# Patient Record
Sex: Female | Born: 1970 | Race: Black or African American | Hispanic: No | Marital: Married | Smoking: Never smoker
Health system: Southern US, Community
[De-identification: ages and names within clinical notes are randomized; demographics above are authoritative.]

## PROBLEM LIST (undated history)

## (undated) DIAGNOSIS — F329 Major depressive disorder, single episode, unspecified: Secondary | ICD-10-CM

## (undated) DIAGNOSIS — R51 Headache: Secondary | ICD-10-CM

## (undated) DIAGNOSIS — D6861 Antiphospholipid syndrome: Secondary | ICD-10-CM

## (undated) DIAGNOSIS — F419 Anxiety disorder, unspecified: Secondary | ICD-10-CM

## (undated) DIAGNOSIS — G8929 Other chronic pain: Secondary | ICD-10-CM

## (undated) DIAGNOSIS — M545 Low back pain, unspecified: Secondary | ICD-10-CM

## (undated) DIAGNOSIS — M797 Fibromyalgia: Secondary | ICD-10-CM

## (undated) DIAGNOSIS — S80862A Insect bite (nonvenomous), left lower leg, initial encounter: Secondary | ICD-10-CM

## (undated) DIAGNOSIS — E789 Disorder of lipoprotein metabolism, unspecified: Secondary | ICD-10-CM

## (undated) DIAGNOSIS — S060X9A Concussion with loss of consciousness of unspecified duration, initial encounter: Secondary | ICD-10-CM

## (undated) DIAGNOSIS — N3010 Interstitial cystitis (chronic) without hematuria: Secondary | ICD-10-CM

## (undated) DIAGNOSIS — K279 Peptic ulcer, site unspecified, unspecified as acute or chronic, without hemorrhage or perforation: Secondary | ICD-10-CM

## (undated) DIAGNOSIS — F32A Depression, unspecified: Secondary | ICD-10-CM

## (undated) DIAGNOSIS — I10 Essential (primary) hypertension: Secondary | ICD-10-CM

## (undated) DIAGNOSIS — F319 Bipolar disorder, unspecified: Secondary | ICD-10-CM

## (undated) DIAGNOSIS — E282 Polycystic ovarian syndrome: Secondary | ICD-10-CM

## (undated) DIAGNOSIS — R609 Edema, unspecified: Secondary | ICD-10-CM

## (undated) DIAGNOSIS — I2699 Other pulmonary embolism without acute cor pulmonale: Secondary | ICD-10-CM

## (undated) DIAGNOSIS — E039 Hypothyroidism, unspecified: Secondary | ICD-10-CM

## (undated) DIAGNOSIS — R519 Headache, unspecified: Secondary | ICD-10-CM

## (undated) DIAGNOSIS — K219 Gastro-esophageal reflux disease without esophagitis: Secondary | ICD-10-CM

## (undated) DIAGNOSIS — M329 Systemic lupus erythematosus, unspecified: Secondary | ICD-10-CM

## (undated) DIAGNOSIS — T39091A Poisoning by salicylates, accidental (unintentional), initial encounter: Secondary | ICD-10-CM

## (undated) DIAGNOSIS — M542 Cervicalgia: Secondary | ICD-10-CM

## (undated) DIAGNOSIS — W57XXXA Bitten or stung by nonvenomous insect and other nonvenomous arthropods, initial encounter: Secondary | ICD-10-CM

## (undated) DIAGNOSIS — M069 Rheumatoid arthritis, unspecified: Secondary | ICD-10-CM

## (undated) HISTORY — DX: Gastro-esophageal reflux disease without esophagitis: K21.9

## (undated) HISTORY — DX: Polycystic ovarian syndrome: E28.2

## (undated) HISTORY — DX: Interstitial cystitis (chronic) without hematuria: N30.10

## (undated) HISTORY — DX: Other pulmonary embolism without acute cor pulmonale: I26.99

## (undated) HISTORY — DX: Anxiety disorder, unspecified: F41.9

## (undated) HISTORY — DX: Edema, unspecified: R60.9

## (undated) HISTORY — DX: Bipolar disorder, unspecified: F31.9

## (undated) HISTORY — DX: Peptic ulcer, site unspecified, unspecified as acute or chronic, without hemorrhage or perforation: K27.9

## (undated) HISTORY — DX: Essential (primary) hypertension: I10

## (undated) HISTORY — DX: Hypothyroidism, unspecified: E03.9

---

## 1999-02-10 ENCOUNTER — Emergency Department (HOSPITAL_COMMUNITY): Admission: EM | Admit: 1999-02-10 | Discharge: 1999-02-11 | Payer: Self-pay | Admitting: Emergency Medicine

## 2002-04-23 HISTORY — PX: LAPAROSCOPIC CHOLECYSTECTOMY: SUR755

## 2002-07-15 ENCOUNTER — Other Ambulatory Visit: Admission: RE | Admit: 2002-07-15 | Discharge: 2002-07-15 | Payer: Self-pay | Admitting: Family Medicine

## 2002-07-20 ENCOUNTER — Encounter: Admission: RE | Admit: 2002-07-20 | Discharge: 2002-07-20 | Payer: Self-pay | Admitting: Family Medicine

## 2002-07-20 ENCOUNTER — Encounter: Payer: Self-pay | Admitting: Family Medicine

## 2002-10-08 ENCOUNTER — Encounter: Admission: RE | Admit: 2002-10-08 | Discharge: 2002-10-08 | Payer: Self-pay | Admitting: Family Medicine

## 2002-10-08 ENCOUNTER — Encounter: Payer: Self-pay | Admitting: Family Medicine

## 2006-04-26 ENCOUNTER — Ambulatory Visit (HOSPITAL_COMMUNITY): Payer: Self-pay | Admitting: Psychiatry

## 2006-05-29 ENCOUNTER — Ambulatory Visit (HOSPITAL_COMMUNITY): Payer: Self-pay | Admitting: Psychiatry

## 2006-07-24 ENCOUNTER — Ambulatory Visit (HOSPITAL_COMMUNITY): Payer: Self-pay | Admitting: Psychiatry

## 2006-09-25 ENCOUNTER — Ambulatory Visit (HOSPITAL_COMMUNITY): Payer: Self-pay | Admitting: Psychiatry

## 2008-11-08 ENCOUNTER — Ambulatory Visit: Payer: Self-pay | Admitting: Occupational Medicine

## 2008-11-08 DIAGNOSIS — F329 Major depressive disorder, single episode, unspecified: Secondary | ICD-10-CM

## 2008-11-08 DIAGNOSIS — F411 Generalized anxiety disorder: Secondary | ICD-10-CM | POA: Insufficient documentation

## 2008-11-08 DIAGNOSIS — E039 Hypothyroidism, unspecified: Secondary | ICD-10-CM | POA: Insufficient documentation

## 2008-11-08 DIAGNOSIS — I10 Essential (primary) hypertension: Secondary | ICD-10-CM

## 2008-11-08 DIAGNOSIS — K5730 Diverticulosis of large intestine without perforation or abscess without bleeding: Secondary | ICD-10-CM | POA: Insufficient documentation

## 2008-11-08 LAB — CONVERTED CEMR LAB
Basophils Absolute: 0 10*3/uL (ref 0.0–0.1)
Basophils Relative: 0 % (ref 0–1)
Eosinophils Absolute: 0 10*3/uL (ref 0.0–0.7)
Eosinophils Relative: 0 % (ref 0–5)
HCT: 41.2 % (ref 36.0–46.0)
Hemoglobin: 14 g/dL (ref 12.0–15.0)
Lymphocytes Relative: 36 % (ref 12–46)
Lymphs Abs: 4.3 10*3/uL — ABNORMAL HIGH (ref 0.7–4.0)
MCHC: 34 g/dL (ref 30.0–36.0)
MCV: 83.4 fL (ref 78.0–100.0)
Monocytes Absolute: 0.2 10*3/uL (ref 0.1–1.0)
Monocytes Relative: 2 % — ABNORMAL LOW (ref 3–12)
Neutro Abs: 7.4 10*3/uL (ref 1.7–7.7)
Neutrophils Relative %: 62 % (ref 43–77)
Platelets: 253 10*3/uL (ref 150–400)
RBC: 4.93 M/uL (ref 3.87–5.11)
RDW: 13.5 % (ref 11.5–15.5)
WBC: 12 10*3/uL — ABNORMAL HIGH (ref 4.0–10.5)

## 2008-11-15 ENCOUNTER — Ambulatory Visit: Payer: Self-pay | Admitting: Family Medicine

## 2008-12-10 ENCOUNTER — Encounter: Payer: Self-pay | Admitting: Family Medicine

## 2009-11-25 ENCOUNTER — Ambulatory Visit: Payer: Self-pay | Admitting: Family Medicine

## 2009-11-25 DIAGNOSIS — N3010 Interstitial cystitis (chronic) without hematuria: Secondary | ICD-10-CM | POA: Insufficient documentation

## 2009-11-25 DIAGNOSIS — E669 Obesity, unspecified: Secondary | ICD-10-CM

## 2010-01-25 ENCOUNTER — Encounter: Payer: Self-pay | Admitting: Family Medicine

## 2010-01-26 ENCOUNTER — Encounter: Payer: Self-pay | Admitting: Family Medicine

## 2010-01-27 ENCOUNTER — Telehealth: Payer: Self-pay | Admitting: Family Medicine

## 2010-02-03 ENCOUNTER — Encounter: Payer: Self-pay | Admitting: Family Medicine

## 2010-02-03 DIAGNOSIS — G4733 Obstructive sleep apnea (adult) (pediatric): Secondary | ICD-10-CM

## 2010-04-18 ENCOUNTER — Encounter: Payer: Self-pay | Admitting: Family Medicine

## 2010-05-23 NOTE — Assessment & Plan Note (Signed)
Summary: NOV interstitial cystitis   Vital Signs:  Patient profile:   40 year old female Height:      64.5 inches Weight:      223 pounds BMI:     37.82 O2 Sat:      99 % on Room air Pulse rate:   85 / minute BP sitting:   126 / 81  (left arm) Cuff size:   large  Vitals Entered By: Payton Spark CMA (November 25, 2009 2:39 PM)  O2 Flow:  Room air CC: New to est. Multiple health concerns., Back Pain   Primary Care Provider:  Seymour Bars DO  CC:  New to est. Multiple health concerns. and Back Pain.  History of Present Illness: 40 yo presents for NOV.  She was formerly seen at [facility].  She would like to lose wt but has struggled with med SEs and fatigue.  She is doing no exercise.  She has a hx of thyroid d/o, HTN, PCOS, migraines, insomnia and bipolar d/o. She sees Dr Wellington Hampshire in [facility] for her bipolar d/o and a counselor.  She reports that she is trying to get used to the effects of all of her medications.  She is set up to get a Sleep study  She is taking Furosemide everyday for fluid retention.   She has had an elevated fasitng sugar with PCOS -- on Metformin.  She is on Zolpem and Klonopin.      Current Medications (verified): 1)  Synthroid 25 Mcg Tabs (Levothyroxine Sodium) .Marland Kitchen.. 1 Tab  By Mouth Once Daily 2)  Furosemide 20 Mg Tabs (Furosemide) .Marland Kitchen.. 1 Tab By Mouth Once Daily 3)  Lorazepam 1 Mg Tabs (Lorazepam) .... Take 2 Tabs By Mouth At Bedtime 4)  Neurontin 300 Mg Caps (Gabapentin) .... Take 3 Caps By Mouth At Bedtime 5)  Clonidine Hcl .... Take 2 Tabs By Mouth At Bedtime 6)  Metformin Hcl 500 Mg Tabs (Metformin Hcl) .... Take 1 Tab By Mouth Two Times A Day 7)  Zolpidem Tartrate 10 Mg Tabs (Zolpidem Tartrate) .... Take 1 Tab By Mouth At Bedtime 8)  Topamax 100 Mg Tabs (Topiramate) .... Take 1 Tab By Mouth Two Times A Day 9)  Verapamil Hcl 120 Mg Tabs (Verapamil Hcl) .... Take 3 Tabs By Mouth At Bedtime 10)  Pantoprazole Sodium 40 Mg Tbec (Pantoprazole Sodium) ....  Take 1 Tab By Mouth Once Daily  Allergies (verified): No Known Drug Allergies  Past History:  Past Medical History: Anxiety Bipolar d/o -- rapid cycler -- Dr Wellington Hampshire Hypertension Hypothyroidism- Dr Sidney Ace (endo) Water retention GERD -- GI Dr Eulah Pont OB Dr Wylene Simmer Interstitial cystitis ENT-  Allergiest- Dr Dixie Dials  Past Surgical History: Cholecystectomy 04  Social History: Married with no children Never Smoked Alcohol use-no Drug use-no Regular exercise-no Works as a Engineer, petroleum  Review of Systems      See HPI       no fevers/sweats/weakness, unexplained wt loss/gain, no change in vision, no difficulty hearing, ringing in ears, no hay fever/allergies, no CP/discomfort, no palpitations, no breast lump/nipple discharge, no cough/wheeze, no blood in stool, no N/V/D, no nocturia, + leaking urine, no unusual vag bleeding, no vaginal/penile discharge, no muscle/joint pain, no rash, no new/changing mole, + HA, no memory loss, + anxiety, +sleep problem, + depression, no unexplained lumps, no easy bruising/bleeding, no concern with sexual function    Physical Exam  General:  alert, well-developed, well-nourished, and well-hydrated.  obese Head:  normocephalic and atraumatic.  Eyes:  conjunctiva clear Nose:  no nasal discharge.   Mouth:  good dentition and pharynx pink and moist.   Neck:  no masses.   Lungs:  Normal respiratory effort, chest expands symmetrically. Lungs are clear to auscultation, no crackles or wheezes. Heart:  Normal rate and regular rhythm. S1 and S2 normal without gallop, murmur, click, rub or other extra sounds. Abdomen:  soft, non-tender, normal bowel sounds, no distention, no masses, no guarding, no hepatomegaly, and no splenomegaly.   Pulses:  2+ radial pulses Extremities:  no E/C/C Neurologic:  no tremor Skin:  color normal.   Cervical Nodes:  No lymphadenopathy noted Psych:  good eye contact, not anxious appearing, and not depressed  appearing.     Impression & Recommendations:  Problem # 1:  INTERSTITIAL CYSTITIS (ICD-595.1) Referral made to urologist for hx of IC.  Orders: Urology Referral (Urology)  Problem # 2:  HYPERTENSION (ICD-401.9)  Her updated medication list for this problem includes:    Furosemide 20 Mg Tabs (Furosemide) .Marland Kitchen... 1 tab by mouth once daily    Verapamil Hcl 120 Mg Tabs (Verapamil hcl) .Marland Kitchen... Take 3 tabs by mouth at bedtime  BP today: 126/81 Prior BP: 133/90 (11/15/2008)  Problem # 3:  HYPOTHYROIDISM (ICD-244.9)  Her updated medication list for this problem includes:    Synthroid 25 Mcg Tabs (Levothyroxine sodium) .Marland Kitchen... 1 tab  by mouth once daily  Orders: T-TSH ([id number]) T-T4, Free [id number])  Problem # 4:  OBESITY, UNSPECIFIED (ICD-278.00) BMI 37 c/w class II obesity. Discussed goals -- dietary changes with increased physical activity. Check thyroid and will be getting a sleep study.    Complete Medication List: 1)  Synthroid 25 Mcg Tabs (Levothyroxine sodium) .Marland Kitchen.. 1 tab  by mouth once daily 2)  Furosemide 20 Mg Tabs (Furosemide) .Marland Kitchen.. 1 tab by mouth once daily 3)  Lorazepam 1 Mg Tabs (Lorazepam) .... Take 2 tabs by mouth at bedtime 4)  Neurontin 300 Mg Caps (Gabapentin) .... Take 3 caps by mouth at bedtime 5)  Clonidine Hcl  .... Take 2 tabs by mouth at bedtime 6)  Metformin Hcl 500 Mg Tabs (Metformin hcl) .... Take 1 tab by mouth two times a day 7)  Zolpidem Tartrate 10 Mg Tabs (Zolpidem tartrate) .... Take 1 tab by mouth at bedtime 8)  Topamax 100 Mg Tabs (Topiramate) .... Take 1 tab by mouth two times a day 9)  Verapamil Hcl 120 Mg Tabs (Verapamil hcl) .... Take 3 tabs by mouth at bedtime 10)  Pantoprazole Sodium 40 Mg Tbec (Pantoprazole sodium) .... Take 1 tab by mouth once daily  Other Orders: T-Comprehensive Metabolic Panel [id number]) T-Lipid Profile ([id number])  Patient Instructions: 1)  Update fasting labs. 2)  Will call you w/ results. 3)  Meds  RFd. 4)  Return to see me as needed. 5)  I will happy to coordinate care with your specialists. 6)  Focus on low carb diet (high protein, lots of veggies) and increase exercise. Prescriptions: TOPAMAX 100 MG TABS (TOPIRAMATE) Take 1 tab by mouth two times a day  #60 x 3   Entered and Authorized by:   Seymour Bars DO   Signed by:   Seymour Bars DO on 11/25/2009   Method used:   Electronically to        [facility]* (retail)       [address] [place], [place]  [address]       Ph: [phone]  Fax: [phone]   RxID:   [id number] FUROSEMIDE 20 MG TABS (FUROSEMIDE) 1 tab by mouth once daily  #30 x 3   Entered and Authorized by:   Seymour Bars DO   Signed by:   Seymour Bars DO on 11/25/2009   Method used:   Electronically to        [facility]* (retail)       [address] [place], [place]  [address]       Ph: [phone]       Fax: [phone]   RxID:   [id number]

## 2010-05-23 NOTE — Progress Notes (Signed)
Summary: CALL A NURSE  Phone Note From Other Clinic   Summary of Call: [facility] Triage Call Report Triage Record Num: [id number] Operator: Patriciaann Clan Patient Name: Diane [name] Call Date & Time: 01/26/2010 7:09:45PM Patient Phone: [phone] PCP: Seymour Bars, DO Patient Gender: Female PCP Fax : [phone] Patient DOB: 1970/10/10 Practice Name: [facility] Reason for Call: LMP 01/16/10. Patient calling. States has hx of migraine headaches. States developed headache over frontal area of head and behind eye, onset 01/26/10 1700. States pain @ "8" on 1-10 scale. States has fever per tactile. Onset 01/26/10. Patient does not have a thermometer. No nuccal rigidity. Patient advised to be seen in UC now for evaluation. Patient advised not to drive self. Patietn verbalizes understanding and agreeable. Initial call taken by: Payton Spark CMA,  January 27, 2010 11:07 AM

## 2010-05-23 NOTE — Miscellaneous (Signed)
Summary: osa  Clinical Lists Changes  Problems: Added new problem of OBSTRUCTIVE SLEEP APNEA (ICD-327.23)

## 2010-05-23 NOTE — Letter (Signed)
Summary: Letter Regarding Sleep Study/[facility]  Letter Regarding Sleep Study/[facility]   Imported By: Lanelle Bal 02/16/2010 08:41:23  _____________________________________________________________________  External Attachment:    Type:   Image     Comment:   External Document

## 2010-05-23 NOTE — Letter (Signed)
Summary: Letters Regarding Sleep Study/[facility]  Letters Regarding Sleep Study/[facility]   Imported By: Lanelle Bal 02/10/2010 16:08:11  _____________________________________________________________________  External Attachment:    Type:   Image     Comment:   External Document

## 2010-05-25 NOTE — Letter (Signed)
Summary: [facility]  [facility]   Imported By: Lanelle Bal 05/03/2010 10:31:01  _____________________________________________________________________  External Attachment:    Type:   Image     Comment:   External Document

## 2010-06-07 DIAGNOSIS — G43909 Migraine, unspecified, not intractable, without status migrainosus: Secondary | ICD-10-CM | POA: Insufficient documentation

## 2010-06-07 DIAGNOSIS — F309 Manic episode, unspecified: Secondary | ICD-10-CM | POA: Insufficient documentation

## 2010-06-14 ENCOUNTER — Ambulatory Visit: Payer: Self-pay | Admitting: Family Medicine

## 2010-06-16 ENCOUNTER — Ambulatory Visit: Payer: Self-pay | Admitting: Family Medicine

## 2010-06-16 ENCOUNTER — Telehealth: Payer: Self-pay | Admitting: Family Medicine

## 2010-06-20 NOTE — Progress Notes (Signed)
Summary: Refill--Furosemide  Phone Note Refill Request Message from:  Patient on June 16, 2010 9:54 AM  Refills Requested: Medication #1:  FUROSEMIDE 20 MG TABS 1 tab by mouth once daily   Last Refilled: 10.2011   Notes: [facility]  Initial call taken by: Lucious Groves CMA,  June 16, 2010 9:54 AM    Prescriptions: FUROSEMIDE 20 MG TABS (FUROSEMIDE) 1 tab by mouth once daily  #30 x 3   Entered by:   Lucious Groves CMA   Authorized by:   Seymour Bars DO   Signed by:   Lucious Groves CMA on 06/16/2010   Method used:   Electronically to        [facility]* (retail)       [address] [place], [place]  [address]       Ph: [phone]       Fax: [phone]   RxID:   [id number]

## 2010-06-23 ENCOUNTER — Ambulatory Visit (INDEPENDENT_AMBULATORY_CARE_PROVIDER_SITE_OTHER): Payer: 59 | Admitting: Family Medicine

## 2010-06-23 ENCOUNTER — Encounter: Payer: Self-pay | Admitting: Family Medicine

## 2010-06-23 DIAGNOSIS — E282 Polycystic ovarian syndrome: Secondary | ICD-10-CM

## 2010-06-23 DIAGNOSIS — E039 Hypothyroidism, unspecified: Secondary | ICD-10-CM

## 2010-06-23 DIAGNOSIS — G4733 Obstructive sleep apnea (adult) (pediatric): Secondary | ICD-10-CM

## 2010-06-23 DIAGNOSIS — E669 Obesity, unspecified: Secondary | ICD-10-CM

## 2010-06-29 NOTE — Assessment & Plan Note (Signed)
Summary: f/u sleep apnea/ obesity   Vital Signs:  Patient profile:   40 year old female Height:      64.5 inches Weight:      225 pounds BMI:     38.16 O2 Sat:      98 % on Room air Pulse rate:   87 / minute BP sitting:   140 / 91  (left arm) Cuff size:   large  Vitals Entered By: Payton Spark CMA (June 23, 2010 2:09 PM)  O2 Flow:  Room air CC: F/U.    Primary Care Provider:  Seymour Bars DO  CC:  F/U. Marland Kitchen  History of Present Illness: 40 yo AAF presents for f/u visit.  She saw Dr Sidney Ace and has had a high cortisol level.  She has a f/u with him. she has tried to lose wt on her own but has failed.  her gyn told her that she had PCOS.  She had her sugar checked and it's been normal on metformin.  She has HTN.    She has tried to do more exercise and use a calorie counter.  She cut back on soda and is eating more veggies.  Her meds for bipolar have caused wt gain over the years.  she tested + for OSA but has yet to go back for cpap titration.  Back pain for wt.  Her weight has been over 200 for the past 15 yrs.   She would be happy at 120.     Current Medications (verified): 1)  Synthroid 25 Mcg Tabs (Levothyroxine Sodium) .Marland Kitchen.. 1 Tab  By Mouth Once Daily 2)  Furosemide 20 Mg Tabs (Furosemide) .Marland Kitchen.. 1 Tab By Mouth Once Daily 3)  Lorazepam 1 Mg Tabs (Lorazepam) .... Take 2 Tabs By Mouth At Bedtime 4)  Neurontin 300 Mg Caps (Gabapentin) .... Take 3 Caps By Mouth At Bedtime 5)  Clonidine Hcl .... Take 2 Tabs By Mouth At Bedtime 6)  Metformin Hcl 500 Mg Tabs (Metformin Hcl) .... Take 1 Tab By Mouth Two Times A Day 7)  Zolpidem Tartrate 10 Mg Tabs (Zolpidem Tartrate) .... Take 1 Tab By Mouth At Bedtime 8)  Verelan 240 Mg Xr24h-Cap (Verapamil Hcl) .... Take 1 Cap By Mouth At Bedtime 9)  Omeprazole 40 Mg Cpdr (Omeprazole) .... Take 1 Cap By Mouth Once Daily  Allergies (verified): No Known Drug Allergies  Past History:  Past Medical History: Reviewed history from 11/25/2009  and no changes required. Anxiety Bipolar d/o -- rapid cycler -- Dr Wellington Hampshire Hypertension Hypothyroidism- Dr Sidney Ace (endo) Water retention GERD -- GI Dr Eulah Pont OB Dr Wylene Simmer Interstitial cystitis ENT-  Allergiest- Dr Dixie Dials  Social History: Reviewed history from 11/25/2009 and no changes required. Married with no children Never Smoked Alcohol use-no Drug use-no Regular exercise-no Works as a Engineer, petroleum  Review of Systems      See HPI  Physical Exam  General:  alert, well-developed, well-nourished, and well-hydrated.   Head:  normocephalic and atraumatic.   Mouth:  pharynx pink and moist.   Neck:  no masses and no thyromegaly.   Lungs:  Normal respiratory effort, chest expands symmetrically. Lungs are clear to auscultation, no crackles or wheezes. Heart:  Normal rate and regular rhythm. S1 and S2 normal without gallop, murmur, click, rub or other extra sounds. Skin:  color normal.   Cervical Nodes:  No lymphadenopathy noted Psych:  good eye contact, not anxious appearing, and not depressed appearing.     Impression &  Recommendations:  Problem # 1:  OBSTRUCTIVE SLEEP APNEA (ICD-327.23) Assessment New I reviewed her recent sleep study that was +.  She needs to go back for CPAP titration and I've encouraged her to do so.  Problem # 2:  POLYCYSTIC OVARIAN DISEASE (ICD-256.4) On metformin and followed by OB but will get an A1c and possibly a 2 hr OGTT to see if she has IFG. Orders: T-Hemoglobin A1C (16109)  Problem # 3:  HYPOTHYROIDISM (ICD-244.9) Seeing Dr Sidney Ace for this. Her updated medication list for this problem includes:    Synthroid 25 Mcg Tabs (Levothyroxine sodium) .Marland Kitchen... 1 tab  by mouth once daily  Problem # 4:  OBESITY, UNSPECIFIED (ICD-278.00) BMI 38 c/w class II obesity.   Unchanged.  Complicated by HTN, back pain, PCOS and likely IFG.  She has failed to lose wt on her own and has been >200 bs for the past 10 yrs or so.  She is mentally stable  and compliant on meds.  She would like to start exercising more.  I have given her the info sheet for [facility] bariatric surgery class.  she is intersted in [place].  Complete Medication List: 1)  Synthroid 25 Mcg Tabs (Levothyroxine sodium) .Marland Kitchen.. 1 tab  by mouth once daily 2)  Furosemide 20 Mg Tabs (Furosemide) .Marland Kitchen.. 1 tab by mouth once daily 3)  Lorazepam 1 Mg Tabs (Lorazepam) .... Take 2 tabs by mouth at bedtime 4)  Neurontin 300 Mg Caps (Gabapentin) .... Take 3 caps by mouth at bedtime 5)  Clonidine Hcl  .... Take 2 tabs by mouth at bedtime 6)  Metformin Hcl 500 Mg Tabs (Metformin hcl) .... Take 1 tab by mouth two times a day 7)  Zolpidem Tartrate 10 Mg Tabs (Zolpidem tartrate) .... Take 1 tab by mouth at bedtime 8)  Verelan 240 Mg Xr24h-cap (Verapamil hcl) .... Take 1 cap by mouth at bedtime 9)  Omeprazole 40 Mg Cpdr (Omeprazole) .... Take 1 cap by mouth once daily  Other Orders: T-Comprehensive Metabolic Panel [id number]) T-Lipid Profile ([id number])  Patient Instructions: 1)  Stay on current meds. 2)  Update fasting labs one morning downstairs. 3)  Call [facility] for info on bariatric surgery class.  [phone] 4)  Call [facility] for f/u sleep apnea. 5)  Let me know if you need anything.   Orders Added: 1)  T-Comprehensive Metabolic Panel [[id number]] 2)  T-Lipid Profile [[id number]] 3)  T-Hemoglobin A1C [23375] 4)  Est. Patient Level IV [84696]

## 2010-07-21 ENCOUNTER — Other Ambulatory Visit: Payer: Self-pay | Admitting: Family Medicine

## 2010-07-22 LAB — COMPREHENSIVE METABOLIC PANEL
Albumin: 4.2 g/dL (ref 3.5–5.2)
Alkaline Phosphatase: 89 U/L (ref 39–117)
BUN: 11 mg/dL (ref 6–23)
CO2: 23 mEq/L (ref 19–32)
Calcium: 9.3 mg/dL (ref 8.4–10.5)
Chloride: 103 mEq/L (ref 96–112)
Glucose, Bld: 97 mg/dL (ref 70–99)
Potassium: 3.9 mEq/L (ref 3.5–5.3)
Sodium: 138 mEq/L (ref 135–145)
Total Bilirubin: 0.3 mg/dL (ref 0.3–1.2)
Total Protein: 7.6 g/dL (ref 6.0–8.3)

## 2010-07-22 LAB — LIPID PANEL
Cholesterol: 192 mg/dL (ref 0–200)
HDL: 52 mg/dL (ref >39)
LDL Cholesterol: 119 mg/dL — ABNORMAL HIGH (ref 0–99)
Total CHOL/HDL Ratio: 3.7 Ratio
Triglycerides: 107 mg/dL (ref <150)

## 2010-07-22 LAB — HEMOGLOBIN A1C
Hgb A1c MFr Bld: 6.2 % — ABNORMAL HIGH (ref <5.7)
Mean Plasma Glucose: 131 mg/dL — ABNORMAL HIGH (ref <117)

## 2010-07-23 ENCOUNTER — Telehealth: Payer: Self-pay | Admitting: Family Medicine

## 2010-07-23 NOTE — Telephone Encounter (Signed)
Pls let pt know that her chemistry panel came back normal, cholesterol is at goal and A1C is OK at 6.2.  Stay on Metformin and continue to work on healthy low sugar diet and regular exercise.  Make sure she is f/u with Dr Sidney Ace for her thyroid.

## 2010-08-01 NOTE — Telephone Encounter (Signed)
LMOM x 3. Closing note until further notice.

## 2010-08-07 ENCOUNTER — Telehealth: Payer: Self-pay | Admitting: *Deleted

## 2010-08-07 NOTE — Telephone Encounter (Signed)
LMOM informing Pt of the above 

## 2010-08-07 NOTE — Telephone Encounter (Signed)
[facility] Triage Call Report Triage Record Num: [id number] Operator: Peri Jefferson Patient Name: Diane [name] Call Date & Time: 08/05/2010 7:22:18PM Patient Phone: [phone] PCP: Seymour Bars, DO Patient Gender: Female PCP Fax : [phone] Patient DOB: May 20, 1970 [facility] Name: [facility] Reason for Call: [facility] calling because she has a hx of migraines. Migraine started on 08/04/10. States that the pain is on her left side of her nose, behind ear and down neck. Has taken Ibuprofen in the past and states that it does not work. Has taken Tylenol sinus med with no relief. Has not taken temp but states that she has a fever. Rates headache as 7.5/10. Left temple is tender. Utilized Headache Guideline. Advised ED.

## 2010-08-07 NOTE — Telephone Encounter (Signed)
Pls find out how pt is feeling now and have her f/u with me in the next 7 days for her migraines.

## 2010-08-10 ENCOUNTER — Encounter: Payer: Self-pay | Admitting: Family Medicine

## 2010-08-15 ENCOUNTER — Ambulatory Visit: Payer: 59 | Admitting: Family Medicine

## 2010-08-18 ENCOUNTER — Encounter: Payer: Self-pay | Admitting: Family Medicine

## 2010-08-21 ENCOUNTER — Encounter: Payer: Self-pay | Admitting: Family Medicine

## 2010-08-21 ENCOUNTER — Ambulatory Visit (INDEPENDENT_AMBULATORY_CARE_PROVIDER_SITE_OTHER): Payer: 59 | Admitting: Family Medicine

## 2010-08-21 VITALS — BP 130/78 | HR 67 | Ht 64.5 in | Wt 227.0 lb

## 2010-08-21 DIAGNOSIS — G43909 Migraine, unspecified, not intractable, without status migrainosus: Secondary | ICD-10-CM

## 2010-08-21 DIAGNOSIS — E669 Obesity, unspecified: Secondary | ICD-10-CM

## 2010-08-21 DIAGNOSIS — M25569 Pain in unspecified knee: Secondary | ICD-10-CM

## 2010-08-21 MED ORDER — ONDANSETRON 8 MG PO TBDP: 8.0000 mg | ORAL_TABLET | Freq: Three times a day (TID) | ORAL | Status: DC | PRN

## 2010-08-21 NOTE — Progress Notes (Signed)
  Subjective:    Patient ID: Diane [name], female    DOB: Nov 17, 1970, 40 y.o.   MRN: [id number]  [facility] yo AAF presents for f/u migraines.    She had to go to the ED 2 wks ago for GI pain and was given a GI coctail and carafate RX.  She wants to go back to Nexium from omeprazole.  This has improved.  Denies epigastric pain, nausea, vomitting, melena or hematochezia.    Referral to migraine center.  Still getting frequent HAs and has failed to improve on prophylactic meds in the past.    Seeing Dr Cecelia Byars and is going thru testing for her sinuses.  She has f/u there and is to proceed with the last part of her sleep study. She would like to proceed with bariatric surgery and is looking at proceeding with this at [facility]. Would like for me to complete her paperwork for this.  BP 130/78  Pulse 67  Ht 5' 4.5" (1.638 m)  Wt 227 lb (102.967 kg)  BMI 38.36 kg/m2  SpO2 96%  LMP 07/27/2010  Patient Active Problem List  Diagnoses  . HYPOTHYROIDISM  . OBESITY, UNSPECIFIED  . ANXIETY  . DEPRESSION  . OBSTRUCTIVE SLEEP APNEA  . HYPERTENSION  . DIVERTICULOSIS OF COLON  . INTERSTITIAL CYSTITIS  . POLYCYSTIC OVARIAN DISEASE       Review of Systems as per HPI     Objective:   Physical Exam  Constitutional: She appears well-developed and well-nourished. No distress.       obese  Neck: Neck supple. No thyromegaly present.  Cardiovascular: Normal rate, regular rhythm and normal heart sounds.   No murmur heard. Pulmonary/Chest: Effort normal and breath sounds normal. No respiratory distress. She has no wheezes.  Abdominal: Soft. There is no tenderness.  Musculoskeletal: She exhibits no edema.  Skin: Skin is warm and dry.          Assessment & Plan:  Knee pain.  Will xray today and f/u results.

## 2010-08-21 NOTE — Patient Instructions (Signed)
Xray knees downstairs. Will call you w/ results.  [facility].  F/u with ENT.  Try to get you sleep study completed. F/u with psych for sleep issues.

## 2010-08-31 ENCOUNTER — Encounter: Payer: Self-pay | Admitting: Family Medicine

## 2010-08-31 DIAGNOSIS — G43909 Migraine, unspecified, not intractable, without status migrainosus: Secondary | ICD-10-CM | POA: Insufficient documentation

## 2010-08-31 NOTE — Assessment & Plan Note (Addendum)
She has complications of OSA, PCOS with IFG, HTN and depression.   BMI is 38.3.  I'd still like her to work on diet, exercise.  Even after bariatric surgery, she will need to learn to adopt new habits.  I will be happy to write her letter but she will likely need to be cleared by her psychiatrist.

## 2010-08-31 NOTE — Assessment & Plan Note (Signed)
Chronic migraines.  Has failed prophylactic treatments in the past.  Complicated by sleep apnea. Will get her in with the [facility].

## 2010-09-19 ENCOUNTER — Ambulatory Visit (INDEPENDENT_AMBULATORY_CARE_PROVIDER_SITE_OTHER): Payer: 59 | Admitting: Family Medicine

## 2010-09-19 ENCOUNTER — Ambulatory Visit
Admission: RE | Admit: 2010-09-19 | Discharge: 2010-09-19 | Disposition: A | Discharge status: Home / Self Care | Payer: 59 | Source: Ambulatory Visit | Attending: Family Medicine | Admitting: Family Medicine

## 2010-09-19 ENCOUNTER — Encounter: Payer: Self-pay | Admitting: Family Medicine

## 2010-09-19 DIAGNOSIS — E669 Obesity, unspecified: Secondary | ICD-10-CM

## 2010-09-19 DIAGNOSIS — M25569 Pain in unspecified knee: Secondary | ICD-10-CM

## 2010-09-19 DIAGNOSIS — I10 Essential (primary) hypertension: Secondary | ICD-10-CM

## 2010-09-19 MED ORDER — HYDROQUINONE 4 % EX CREA: TOPICAL_CREAM | Freq: Two times a day (BID) | CUTANEOUS | Status: DC

## 2010-09-19 NOTE — Assessment & Plan Note (Signed)
BMI 38 complicated by OSA, HTN and PCOS.  Will be working on her letter after I get her 5 yrs of weights.

## 2010-09-19 NOTE — Progress Notes (Signed)
  Subjective:    Patient ID: Diane [name], female    DOB: 02-10-1971, 40 y.o.   MRN: [id number]  HPI 40 yo AAF presents for f/u visit.  We need to get her old records to see her weights dating back 5 yrs.  She is waiting for me to write her letter to [facility] for bariatric surgery.  She has tried and failed with weight watcher, low carb diet, gluten free diet, the grapefruit diet among others and has regained the wt everytime.  She is working on exercise and is already doing pilates on a regular basis.  Her BMI is 38, complicated by PCOS, OSA and HTN.  She is retaining fluid now and her BP has come up.  She ate bacon today and is taking cold and sinus medicine.  Denies HA or blurry vision.  She needs a RF of Hydroquinone cream for her face.   BP 158/92  Pulse 106  Temp(Src) 98.7 F (37.1 C) (Oral)  Wt 227 lb (102.967 kg)  SpO2 96%  LMP 08/26/2010  Review of Systems  Constitutional: Negative for fatigue.  Eyes: Negative for visual disturbance.  Respiratory: Negative for shortness of breath.   Cardiovascular: Negative for chest pain, palpitations and leg swelling.  Genitourinary: Negative for difficulty urinating.  Neurological: Negative for headaches.  Psychiatric/Behavioral: Negative for dysphoric mood.       Objective:   Physical Exam  Constitutional: She appears well-developed and well-nourished. No distress.  Neck: No thyromegaly present.  Cardiovascular: Normal rate, regular rhythm and normal heart sounds.   Pulmonary/Chest: Effort normal and breath sounds normal. No respiratory distress. She has no wheezes.  Musculoskeletal: She exhibits edema (1+ bilat LE edema).  Skin: Skin is warm and dry.  Psychiatric: She has a normal mood and affect.          Assessment & Plan:

## 2010-09-19 NOTE — Assessment & Plan Note (Signed)
BP is high.  Will temporarily go up on her lasix to 40 mg/ day for 4 days.  This should help w/ her fluid retention and rise inBP.  She should stop OTC cold meds which can cause higher BP readings.

## 2010-09-19 NOTE — Patient Instructions (Signed)
Increase Lasix (furosemide) to 2 tabs once a day from today thru Friday.  This will help bring your BP downs and help w/ swelling.  Sign a release for old records so I can get your last 5 yrs of weights and then I will write your letter for [facility].  [facility] referral has been made.  Will check with Victorino Dike on status.

## 2010-09-20 ENCOUNTER — Other Ambulatory Visit: Payer: 59

## 2010-09-20 ENCOUNTER — Telehealth: Payer: Self-pay | Admitting: Family Medicine

## 2010-09-20 NOTE — Telephone Encounter (Signed)
LMOM informing Pt of the above 

## 2010-09-20 NOTE — Telephone Encounter (Signed)
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  If she's interested in starting PT, let me know.

## 2010-10-04 ENCOUNTER — Telehealth: Payer: Self-pay | Admitting: *Deleted

## 2010-10-04 DIAGNOSIS — M25569 Pain in unspecified knee: Secondary | ICD-10-CM

## 2010-10-04 NOTE — Telephone Encounter (Signed)
Our PT dept out here is pretty modern and they can set her up for group exercise here after she graduates from PT.

## 2010-10-04 NOTE — Telephone Encounter (Signed)
LMOM informing Pt of the above. Please refer to PT here.

## 2010-10-04 NOTE — Telephone Encounter (Signed)
Pt Diane [name] stating she would like to do PT but would like to know if there is anywhere that is more modern and fun i.e yoga or palates? Please advise.

## 2010-10-10 ENCOUNTER — Encounter: Payer: Self-pay | Admitting: Family Medicine

## 2010-10-12 ENCOUNTER — Other Ambulatory Visit: Payer: Self-pay | Admitting: Family Medicine

## 2010-10-12 NOTE — Telephone Encounter (Signed)
Pt called and said she was going to need a refill of her furosemide because the provider had increased her medication (doubled the dose).   Plan:  Reviewed the pt chart and it appears as documented that the pt was only increased to 40 mg for 4 days.  Pt should only be short 4 pills.  LMOM for pt to call the triage nurse. Jarvis Newcomer, LPN [facility]

## 2010-10-13 NOTE — Telephone Encounter (Signed)
Pt is now aware that the increase was only for 4 days.  She originally didn't understand this. Jarvis Newcomer, LPN [facility] D[facility]

## 2010-10-18 ENCOUNTER — Ambulatory Visit: Payer: Medicare Other | Admitting: Physical Therapy

## 2010-10-20 ENCOUNTER — Ambulatory Visit: Payer: 59 | Attending: Family Medicine | Admitting: Physical Therapy

## 2010-10-20 DIAGNOSIS — M25569 Pain in unspecified knee: Secondary | ICD-10-CM | POA: Insufficient documentation

## 2010-10-20 DIAGNOSIS — M6281 Muscle weakness (generalized): Secondary | ICD-10-CM | POA: Insufficient documentation

## 2010-10-20 DIAGNOSIS — IMO0001 Reserved for inherently not codable concepts without codable children: Secondary | ICD-10-CM | POA: Insufficient documentation

## 2010-10-30 ENCOUNTER — Encounter: Payer: Self-pay | Admitting: Family Medicine

## 2010-11-01 ENCOUNTER — Encounter: Payer: Medicare Other | Admitting: Physical Therapy

## 2010-11-10 ENCOUNTER — Encounter: Payer: Medicare Other | Admitting: Physical Therapy

## 2010-12-12 ENCOUNTER — Ambulatory Visit (INDEPENDENT_AMBULATORY_CARE_PROVIDER_SITE_OTHER): Payer: 59 | Admitting: Family Medicine

## 2010-12-12 ENCOUNTER — Encounter: Payer: Self-pay | Admitting: Family Medicine

## 2010-12-12 VITALS — BP 134/88 | HR 115 | Ht 64.5 in | Wt 223.0 lb

## 2010-12-12 DIAGNOSIS — I10 Essential (primary) hypertension: Secondary | ICD-10-CM

## 2010-12-12 DIAGNOSIS — R7301 Impaired fasting glucose: Secondary | ICD-10-CM

## 2010-12-12 DIAGNOSIS — E669 Obesity, unspecified: Secondary | ICD-10-CM

## 2010-12-12 NOTE — Progress Notes (Signed)
  Subjective:    Patient ID: Diane [name], female    DOB: 1971/02/19, 40 y.o.   MRN: [id number]  HPI  40 yo AAF presents for f/u visit.  She is trying to get bariatric surgery, preferably the sleeve gastrectomy. She is seeing Dr Lily Peer.  She has co morbidities of PCOS, OSA, HTN and IFG.  She need 6 mos of physican supervised weight loss for [facility] to pay for this.  Her co morbidities for her Class II obesity with a BMI of 37.69 are PCOS, OSA, HTN and IfGs.  She is already seeing a nutrtionist.  She is down 4 lbs from her last office visit.    BP 134/88  Pulse 115  Ht 5' 4.5" (1.638 m)  Wt 223 lb (101.152 kg)  BMI 37.69 kg/m2  SpO2 99%  LMP 12/05/2010   Review of Systems  Respiratory: Negative for shortness of breath.   Cardiovascular: Negative for chest pain.  Psychiatric/Behavioral: Negative for sleep disturbance and dysphoric mood.       Objective:   Physical Exam  Constitutional: She appears well-developed and well-nourished.  Cardiovascular: Normal rate, regular rhythm and normal heart sounds.   Pulmonary/Chest: Effort normal and breath sounds normal.  Psychiatric: She has a normal mood and affect.          Assessment & Plan:  Obesity, class II, BMI 37.69, down 4 lbs.  Complicated by PCOS, OSA, HTN, IFG. Her plan is to stick to a low sugar/ low carb diet and see nutritionist for detailed info.  Avoid all soda. She is not a candidate for appetite suppressants given her HTN. Her plan for exercise for the next month is pilates 2 days/ wk and yoga 3 days/ wk. She plans to eat more slowly and park farther away.  RTC 1 mo.

## 2010-12-26 ENCOUNTER — Other Ambulatory Visit: Payer: Self-pay | Admitting: Family Medicine

## 2010-12-26 ENCOUNTER — Other Ambulatory Visit: Payer: Self-pay | Admitting: *Deleted

## 2010-12-26 MED ORDER — FUROSEMIDE 20 MG PO TABS: 20.0000 mg | ORAL_TABLET | Freq: Every day | ORAL | Status: DC

## 2010-12-26 NOTE — Telephone Encounter (Signed)
Pt called and said her pharm had made several attempts to get refill for her furesomide medication. Plan:  Reviewed the pt chart and her medication had already been sent today.  Pt informed. Jarvis Newcomer, LPN [facility]

## 2011-01-25 ENCOUNTER — Telehealth: Payer: Self-pay | Admitting: Family Medicine

## 2011-01-25 DIAGNOSIS — I2699 Other pulmonary embolism without acute cor pulmonale: Secondary | ICD-10-CM

## 2011-01-25 HISTORY — DX: Other pulmonary embolism without acute cor pulmonale: I26.99

## 2011-01-25 NOTE — Telephone Encounter (Signed)
Pt called and has been under lots of stress, and called today C/O chest pain, SOB, and passed out two weeks ago, and yesterday.  This problem has never been this severe and pt rates the pain in chest #5/10 at current. Plan:  Pt instructed to go to the ED at [facility]. Jarvis Newcomer, LPN [facility]

## 2011-01-30 ENCOUNTER — Telehealth: Payer: Self-pay | Admitting: Family Medicine

## 2011-01-30 NOTE — Telephone Encounter (Signed)
Pt called and said we had recently sent her to the ED and pt ended up having Pulm embolism.  Went to [employer].  Pt needs INR drawn tomorrow and she is having the hospital send orders and hosp records to Dr. Linford Arnold. Routed to Dr. Marlyne Beards, LPN [facility]

## 2011-01-30 NOTE — Telephone Encounter (Signed)
OK for nurse visit for inr tomorrow.

## 2011-01-31 ENCOUNTER — Ambulatory Visit: Payer: Self-pay | Admitting: Family Medicine

## 2011-01-31 ENCOUNTER — Telehealth: Payer: Self-pay | Admitting: Family Medicine

## 2011-01-31 ENCOUNTER — Other Ambulatory Visit: Payer: Self-pay | Admitting: Family Medicine

## 2011-01-31 ENCOUNTER — Ambulatory Visit: Payer: 59

## 2011-01-31 DIAGNOSIS — Z86711 Personal history of pulmonary embolism: Secondary | ICD-10-CM | POA: Insufficient documentation

## 2011-01-31 DIAGNOSIS — I2699 Other pulmonary embolism without acute cor pulmonale: Secondary | ICD-10-CM

## 2011-01-31 LAB — PROTIME-INR: INR: 1.71 — ABNORMAL HIGH (ref <1.50)

## 2011-01-31 MED ORDER — WARFARIN SODIUM 7.5 MG PO TABS: ORAL_TABLET | ORAL | Status: DC

## 2011-01-31 NOTE — Telephone Encounter (Signed)
Received PT/INR results and called [facility] for pt DIscharge records so I can call Dr. Linford Arnold with results and record specifics from the hospital.  Plan:  Dr. Linford Arnold notified and discussed pt coumadin regimen.  Orders are as follows: Pt to continue lovenox injections 100 mg BID until INR therapeutic @ 2.00 or higher per hosp recommendations. Pt to take 1 1/2 of 7.5 mg daily of warfarin today @ 5:00pm. Pt to take 1 of 7.5 mg all other days. Pt given nurse visit appt for next Tuesday to repeat INR here in our office.   Pt was sent a electronic script to her pharm for refill on the coumadin 7.5 mg because hosp only gave enough for 5 days according to pt. Jarvis Newcomer, LPN [facility]

## 2011-01-31 NOTE — Telephone Encounter (Signed)
Pt called and said she couldn't get here before 12:00 noon so an order for [facility] was placed so pt can go there for testing today to determine her inr since on lovenox injections.  Told needs to be within 2---3 range to be therapeutic. Plan:  Pt was told to go to [facility] so she can go after lunch to better accomodate her schedule.  Ordered PT/INR stat so we can get the level today and I can call the provider with the #'s. Order was faxed down to [facility]. Jarvis Newcomer, LPN [facility]

## 2011-01-31 NOTE — Telephone Encounter (Signed)
LMOM for pt at her home # telling her to call the office to schedule nurse visit for INR today per Dr. Linford Arnold order. Jarvis Newcomer, LPN [facility]

## 2011-02-01 ENCOUNTER — Telehealth: Payer: Self-pay | Admitting: Family Medicine

## 2011-02-01 MED ORDER — WARFARIN SODIUM 5 MG PO TABS: ORAL_TABLET | ORAL | Status: DC

## 2011-02-01 MED ORDER — WARFARIN SODIUM 1 MG PO TABS: 7.5000 mg | ORAL_TABLET | ORAL | Status: DC

## 2011-02-01 MED ORDER — ENOXAPARIN SODIUM 100 MG/ML ~~LOC~~ SOLN: 100.0000 mg | Freq: Two times a day (BID) | SUBCUTANEOUS | Status: DC

## 2011-02-01 NOTE — Telephone Encounter (Signed)
I not sure that this is related to the Lovenox or the Phenergan per se. She is coming back to her 7.5 mg dose daily tonight. See if she can keep this down. She vomits and she can go to urgent care tomorrow. We can also call in Phenergan 25 mg one every 6-8 hours as needed #10 with no refills. Also we can switch her warfarin to brand Coumadin. I am not 100% sure what is causing her nausea that have not evaluated her since she was admitted. She does have a followup scheduled next Tuesday with Dr. Thurmond Butts.

## 2011-02-01 NOTE — Telephone Encounter (Signed)
Pharmacy notified to make coumadin only brand name.  Told pharmacist sending script for N/V phenergan 25 mg, and will send lovenox injections script as well.  Pharmacy will notifiy the pt. Jarvis Newcomer, LPN [facility]'

## 2011-02-01 NOTE — Telephone Encounter (Signed)
Completed. Jarvis Newcomer, LPN [facility]

## 2011-02-01 NOTE — Telephone Encounter (Signed)
Closed

## 2011-02-01 NOTE — Telephone Encounter (Signed)
Pt called and said she got really sick last night and does not feel that her med (warfarin) stayed down.  Pt  Is feeling uncomfortable and feels that she may need to be checked.  Did get one injection in last night.  Please advise.  Pt did state she was generic sensitive.  Vomited violently.  Pt given brand in the hospital.  Didn't get sick to point of vomiting in the hosp, but was nauseted. Jarvis Newcomer, LPN [facility]

## 2011-02-02 ENCOUNTER — Telehealth: Payer: Self-pay | Admitting: Family Medicine

## 2011-02-02 NOTE — Telephone Encounter (Signed)
Pt called and said the pharmacy yesterday did not have her lovenox brand medication.  She said she didn't get any shots Thurs, and only one shot last Wednesday.   Plan:  Pt notified and informed that verbal orders were given directly to the pharmacist yesterday because they actually called our office, and scripts for brand lovenox, brand coumadin, and phernergan 25 mg PO given.  Pt will call the pharmacy. Jarvis Newcomer, LPN [facility]  Addendum:  Pharmacist notified and straightened out all medications.

## 2011-02-06 ENCOUNTER — Other Ambulatory Visit (INDEPENDENT_AMBULATORY_CARE_PROVIDER_SITE_OTHER): Payer: 59 | Admitting: Family Medicine

## 2011-02-06 ENCOUNTER — Other Ambulatory Visit: Payer: Self-pay | Admitting: Family Medicine

## 2011-02-06 ENCOUNTER — Other Ambulatory Visit: Payer: Self-pay | Admitting: *Deleted

## 2011-02-06 VITALS — BP 143/94 | HR 125 | Ht 64.0 in | Wt 224.0 lb

## 2011-02-06 DIAGNOSIS — I749 Embolism and thrombosis of unspecified artery: Secondary | ICD-10-CM

## 2011-02-06 LAB — POCT INR: INR: 1.9

## 2011-02-06 MED ORDER — ENOXAPARIN SODIUM 100 MG/ML ~~LOC~~ SOLN: 100.0000 mg | Freq: Two times a day (BID) | SUBCUTANEOUS | Status: DC

## 2011-02-06 NOTE — Telephone Encounter (Signed)
Prescription sent in for this pt for her lovenox since INR 1.9.  Need to continue the injections till INR therapeutic at 2.0 or >.  Kim handling the pt as far as talking with her.   Jarvis Newcomer, LPN [facility]

## 2011-02-06 NOTE — Patient Instructions (Signed)
Since the patient seems to express to the nursing staff concern about her medical care and concerns about her medication and at the same time unable to clarify what coumadin dosage she is taking and that her INR is not therapeutic I have recommended that she returns Friday for a repeat INR and follow up with the Physician.

## 2011-02-06 NOTE — Progress Notes (Addendum)
Here for PT/INR check. Pt currently on Lovenox and coumadin combined

## 2011-02-06 NOTE — Progress Notes (Signed)
Addended by: Hassan Rowan on: 02/06/2011 07:05 PM   Modules accepted: Level of Service

## 2011-02-08 ENCOUNTER — Ambulatory Visit: Payer: 59 | Admitting: Family Medicine

## 2011-02-08 ENCOUNTER — Telehealth: Payer: Self-pay | Admitting: Family Medicine

## 2011-02-08 ENCOUNTER — Ambulatory Visit: Payer: 59

## 2011-02-08 ENCOUNTER — Ambulatory Visit (INDEPENDENT_AMBULATORY_CARE_PROVIDER_SITE_OTHER): Payer: 59 | Admitting: Family Medicine

## 2011-02-08 VITALS — BP 143/96 | HR 104 | Temp 98.3°F | Resp 20 | Ht 64.0 in | Wt 222.0 lb

## 2011-02-08 DIAGNOSIS — I2699 Other pulmonary embolism without acute cor pulmonale: Secondary | ICD-10-CM

## 2011-02-08 LAB — POCT INR: INR: 2

## 2011-02-08 NOTE — Progress Notes (Signed)
  Subjective:    Patient ID: Diane [name], female    DOB: 1970/04/26, 40 y.o.   MRN: [id number]  HPI    Review of Systems     Objective:   Physical Exam        Assessment & Plan:  Pt presents today for INR check (nurse visit).  Pt complaining of tiredness and pain RT lower posterior of leg.  Pt instructed if symptoms worsen to go to ED or if any symptoms like she had with previous PE.  INR 2.0 today and therapeutic, and pt instructed to stop the lovenox injections.  Continue coumadin 7.5 mg daily.  Keep appt with Dr. Linford Arnold on Monday 02-12-11.  Pt instructed to get some rest over the weekend. Jarvis Newcomer, LPN [facility]

## 2011-02-08 NOTE — Patient Instructions (Signed)
Pt instructed to make sure she is taking her coumadin 7.5mg  daily. Pt instructed to keep her appt Monday 02-12-11. Pt instructed if the pain worsens in her lower RT leg to go to the ED, or if notices any symptoms like she had with the last recent PE. Pt instructed to go off the lovenox injections since INR therapeutic at 2.0. Pt instructed to continue her iron supplement since her iron was low on the last hospitalization and she is complaining of tiredness. Jarvis Newcomer, LPN [facility]

## 2011-02-08 NOTE — Telephone Encounter (Signed)
Pt called in and said she had an appt today with Dr. Thurmond [name] and to have INR checked, but she couldn't come because she is dependent upon her husband bringing her and she could not make at the time the appt was scheduled earlier today.  Pt states she is uptight about her whole situation regarding the PE, and is feeling uncomfortable because she has not seen a provider up until this point.  Pt feels it has been a struggle getting her medications and just getting someone to talk with her about her situation.  Since Dr. Cathey [name] is not in the office any more pt feels like she really does not have a provider. Plan:  Pt instructed to have husband bring her in this PM for the nurse only visit to check her INR.  If therapeutic between 2--3 will discontinue the lovenox injections.  Explained to pt that Dr. Thurmond [name] will not be able to see since her orig appt earlier today was cancelled by her husband.  If pt does not show this afternoon for nurse visit by 4:30pm, then pt is to go to [place] lab tomorrow Friday am to do stat INR and whoever is on call will have to address the PT/INR regimen.  Pt was instructed to keep her appt with Dr. Linford [name] for Monday 02-12-11 @ 1:00pm.      Diane Newcomer, LPN [facility]

## 2011-02-12 ENCOUNTER — Encounter: Payer: Self-pay | Admitting: Family Medicine

## 2011-02-12 ENCOUNTER — Telehealth: Payer: Self-pay | Admitting: Family Medicine

## 2011-02-12 ENCOUNTER — Ambulatory Visit (INDEPENDENT_AMBULATORY_CARE_PROVIDER_SITE_OTHER): Payer: 59 | Admitting: Family Medicine

## 2011-02-12 ENCOUNTER — Ambulatory Visit: Payer: Self-pay | Admitting: Family Medicine

## 2011-02-12 VITALS — BP 135/93 | HR 110 | Wt 222.0 lb

## 2011-02-12 DIAGNOSIS — D500 Iron deficiency anemia secondary to blood loss (chronic): Secondary | ICD-10-CM | POA: Insufficient documentation

## 2011-02-12 DIAGNOSIS — G43909 Migraine, unspecified, not intractable, without status migrainosus: Secondary | ICD-10-CM

## 2011-02-12 DIAGNOSIS — I2699 Other pulmonary embolism without acute cor pulmonale: Secondary | ICD-10-CM

## 2011-02-12 DIAGNOSIS — E611 Iron deficiency: Secondary | ICD-10-CM

## 2011-02-12 LAB — POCT INR: INR: 2.4

## 2011-02-12 MED ORDER — FERROUS SULFATE 325 (65 FE) MG PO TABS: 325.0000 mg | ORAL_TABLET | Freq: Every day | ORAL | Status: DC

## 2011-02-12 MED ORDER — ZOLMITRIPTAN 5 MG PO TABS: 5.0000 mg | ORAL_TABLET | ORAL | Status: DC | PRN

## 2011-02-12 MED ORDER — HYDROCODONE-ACETAMINOPHEN 5-325 MG PO TABS: 2.0000 | ORAL_TABLET | Freq: Two times a day (BID) | ORAL | Status: DC | PRN

## 2011-02-12 NOTE — Telephone Encounter (Signed)
Verified that pt had kept her appt for today at 1:00pm with Dr. Linford Arnold. Diane Newcomer, LPN [facility]

## 2011-02-12 NOTE — Patient Instructions (Signed)
May take 6-8 weeks to feel completely better Can use Tylenol for pain.

## 2011-02-12 NOTE — Progress Notes (Signed)
Subjective:    Patient ID: Diane [name], female    DOB: [birth year], 40 y.o.   MRN: [id number]  HPI Here to f/u on PE. She was discharged on Coumadin 7.5 mg a day and Lovenox injections. She was initially supposed to see the physician last Thursday that one is able to make the appointment but did come in at least have an INR checked. Her INR was 2.0 at that time to her for Lovenox was stopped and it was scheduled for today. Since her hospital discharge on 01/30/2011 she denies any fever or temp. Maybe some SOB and has had some chest discomfort. Says She has been very consistent with her meds. She is off the lovenox injections. She has some FMLA paperwork to drop off. She was also diagnosed with iron deficiency anemia and started on a prescription iron tablet.  After reviewing her hospital notes he did have a CT scan of the chest showing a small pulmonary embolus on the right lower lobe. She had negative lower extremity Dopplers. Her hemoglobin was 9.6 with an MCV of 69. Her total iron was 33 and ferritin was 6.9. Review of Systems     BP 135/93  Pulse 110  Wt 222 lb (100.699 kg)  LMP 02/06/2011    Allergies  Allergen Reactions  . Relpax (Eletriptan Hydrobromide) Nausea And Vomiting    Past Medical History  Diagnosis Date  . Anxiety   . Bipolar 1 disorder     rapid cycler-- Dr Wellington Hampshire  . Hypertension   . Hypothyroidism     Dr Sidney Ace ([place])  . Water retention   . GERD (gastroesophageal reflux disease)     GI Dr Eulah Pont  . Interstitial cystitis     Past Surgical History  Procedure Date  . Cholecystectomy 2004    History   Social History  . Marital Status: Married    Spouse Name: N/A    Number of Children: N/A  . Years of Education: N/A   Occupational History  . Not on file.   Social History Main Topics  . Smoking status: Never Smoker   . Smokeless tobacco: Not on file  . Alcohol Use: No  . Drug Use: No  . Sexually Active:    Other Topics Concern  .  Not on file   Social History Narrative  . No narrative on file    Family History  Problem Relation Age of Onset  . Anxiety disorder Other   . Depression Other   . Hypertension Other     Ms. [name] had no medications administered during this visit.  Objective:   Physical Exam  Constitutional: She is oriented to person, place, and time. She appears well-developed and well-nourished.  HENT:  Head: Normocephalic and atraumatic.  Cardiovascular: Normal rate, regular rhythm and normal heart sounds.   Pulmonary/Chest: Effort normal and breath sounds normal.  Neurological: She is alert and oriented to person, place, and time.  Skin: Skin is warm and dry.  Psychiatric: She has a normal mood and affect. Her behavior is normal.          Assessment & Plan:  PE - continue current dose. Recheck in one week. I explained if she is stable then we will hopefully be able to extend her time between her INR checks. Avoid green leafies etc. Key is consistency. Likely will be able to come off in 6 months but will need to check her records. She thinks a long 9 hour car ride was the  most likely precipitating factor. May take 6-8 weeks for her to completely improve.  She was also given hydrocodone and discharged and would like a refill on this. I did explain that if definitely a short-term medication and we will use it hopefully very briefly for the next couple weeks and at that point she should be doing a little bit better and not need that. She can also use Tylenol for pain if she chooses not to use the Norco. She should definitely avoid NSAIDs.  Iron deficiency anemia-note, she has been having heavy periods. She will need a new prescription for iron tablets. We'll recheck her iron, hemoglobin, ferritin in 8 weeks. I did warn iron can be constipating so she may need to take Colace as a stool softener to help this.  Migraines-she would like to have something for rescue. She said she has taken Zomig  in the past and has done really well with this. She thinks it was the higher does tab. Within the subdural pharmacy but if it requires prior authorization I warned her that she may have to try generic Imitrex first. She thinks she has not tried this previously said this might be a good option for her. She did not do well with Relpax in the past and add this to her intolerance list.

## 2011-02-12 NOTE — Telephone Encounter (Signed)
Call pt: I reviewed the guidelines and they recommend coumadin for 3 months. So will be able to come off around mid January. At that time once off the coumadin for about 2 weeks will do additional blood work for blood clotting disorders.

## 2011-02-12 NOTE — Telephone Encounter (Signed)
Message copied by Gifford Shave on Mon Feb 12, 2011  3:44 PM ------      Message from: Hassan Rowan      Created: Fri Feb 09, 2011  1:44 PM       As per discussion lovenox stopped. Folow up w/Dr M next week.

## 2011-02-13 NOTE — Telephone Encounter (Signed)
LMOM with dr. instructions

## 2011-02-20 ENCOUNTER — Ambulatory Visit (INDEPENDENT_AMBULATORY_CARE_PROVIDER_SITE_OTHER): Payer: 59 | Admitting: Family Medicine

## 2011-02-20 VITALS — BP 143/97 | HR 104 | Resp 20 | Ht 64.0 in | Wt 224.0 lb

## 2011-02-20 DIAGNOSIS — I2699 Other pulmonary embolism without acute cor pulmonale: Secondary | ICD-10-CM

## 2011-02-20 LAB — POCT INR: INR: 3.5

## 2011-02-20 NOTE — Patient Instructions (Signed)
1.  Pt. Instructed of new warfarin dosing regimen:  7.5 mg all days except 5 mg on Tues, Thurs, Sat.   2.  Return for INR check in one week. 3.  Pt given instruction sheet to follow regimen. 4.  Pt instructed that if menses worsen and bleeding is very heavy and she can't get stopped or slowed down that she needs to call the office. Jarvis Newcomer, LPN [facility]

## 2011-02-20 NOTE — Progress Notes (Signed)
  Subjective:    Patient ID: Diane [name], female    DOB: 05-16-1970, 40 y.o.   MRN: [id number]  HPI    Review of Systems     Objective:   Physical Exam        Assessment & Plan:  Pt presents today for INR check.  Pt currently taking 7.5 mg PO daily of warfarin.  Pt reports today that she has had irreg menses that started 3-4 days ago that requires her to wear a thin pad constantly.  Pt INR today 3.5. Jarvis Newcomer, LPN [facility]

## 2011-02-26 ENCOUNTER — Telehealth: Payer: Self-pay | Admitting: *Deleted

## 2011-02-26 NOTE — Telephone Encounter (Signed)
Dentist office called and states pt has a regular routine dentist appointment tomorrow and wanted to know if they needed to do anything since pt was on Coumadin now? Please advise

## 2011-02-26 NOTE — Telephone Encounter (Signed)
Nothing special if just a regular check up and not getting dental work

## 2011-02-26 NOTE — Telephone Encounter (Signed)
Revonda Standard made aware of instructions. KJ LPN

## 2011-02-27 ENCOUNTER — Ambulatory Visit: Payer: Self-pay | Admitting: Family Medicine

## 2011-02-27 ENCOUNTER — Other Ambulatory Visit (INDEPENDENT_AMBULATORY_CARE_PROVIDER_SITE_OTHER): Payer: 59 | Admitting: Family Medicine

## 2011-02-27 VITALS — BP 136/89 | HR 111

## 2011-02-27 DIAGNOSIS — I2699 Other pulmonary embolism without acute cor pulmonale: Secondary | ICD-10-CM

## 2011-02-27 LAB — POCT INR: INR: 2.9

## 2011-02-27 NOTE — Progress Notes (Signed)
PT/INR check  See coumadin flow sheet for changes.

## 2011-03-02 ENCOUNTER — Ambulatory Visit: Payer: 59

## 2011-03-03 ENCOUNTER — Emergency Department (INDEPENDENT_AMBULATORY_CARE_PROVIDER_SITE_OTHER): Payer: 59

## 2011-03-03 ENCOUNTER — Emergency Department (HOSPITAL_BASED_OUTPATIENT_CLINIC_OR_DEPARTMENT_OTHER)
Admission: EM | Admit: 2011-03-03 | Discharge: 2011-03-03 | Disposition: A | Discharge status: Home / Self Care | Payer: 59 | Attending: Emergency Medicine | Admitting: Emergency Medicine

## 2011-03-03 ENCOUNTER — Encounter (HOSPITAL_BASED_OUTPATIENT_CLINIC_OR_DEPARTMENT_OTHER): Payer: Self-pay | Admitting: *Deleted

## 2011-03-03 DIAGNOSIS — F319 Bipolar disorder, unspecified: Secondary | ICD-10-CM | POA: Insufficient documentation

## 2011-03-03 DIAGNOSIS — I10 Essential (primary) hypertension: Secondary | ICD-10-CM | POA: Insufficient documentation

## 2011-03-03 DIAGNOSIS — R0602 Shortness of breath: Secondary | ICD-10-CM

## 2011-03-03 DIAGNOSIS — R0789 Other chest pain: Secondary | ICD-10-CM

## 2011-03-03 DIAGNOSIS — Z79899 Other long term (current) drug therapy: Secondary | ICD-10-CM | POA: Insufficient documentation

## 2011-03-03 DIAGNOSIS — F411 Generalized anxiety disorder: Secondary | ICD-10-CM | POA: Insufficient documentation

## 2011-03-03 DIAGNOSIS — K219 Gastro-esophageal reflux disease without esophagitis: Secondary | ICD-10-CM | POA: Insufficient documentation

## 2011-03-03 LAB — BASIC METABOLIC PANEL
Calcium: 10.2 mg/dL (ref 8.4–10.5)
Creatinine, Ser: 0.9 mg/dL (ref 0.50–1.10)
GFR calc non Af Amer: 80 mL/min — ABNORMAL LOW (ref >90)
Sodium: 134 mEq/L — ABNORMAL LOW (ref 135–145)

## 2011-03-03 LAB — PROTIME-INR
INR: 1.86 — ABNORMAL HIGH (ref 0.00–1.49)
Prothrombin Time: 21.8 seconds — ABNORMAL HIGH (ref 11.6–15.2)

## 2011-03-03 MED ORDER — IOHEXOL 350 MG/ML SOLN
100.0000 mL | Freq: Once | INTRAVENOUS | Status: AC | PRN
  Administered 2011-03-03: 100 mL via INTRAVENOUS

## 2011-03-03 NOTE — ED Provider Notes (Signed)
History     CSN: [id number] Arrival date & time: 03/03/2011 10:46 AM   First MD Initiated Contact with Patient 03/03/11 1133      Chief Complaint  Patient presents with  . Shortness of Breath    (Consider location/radiation/quality/duration/timing/severity/associated sxs/prior treatment) HPI Patient presents with a complaint of sore throat and intermittent shortness of breath. She was diagnosed approximately 5 weeks ago with a pulmonary embolism and is on Coumadin for this. She's been getting her INR checked regularly. She states she has had intermittent shortness of breath for the past 2 weeks and a sensation of sore throat and throat swelling. She has no actual difficulty breathing or airway obstruction or difficulty swallowing. She also describes general myalgias and some fatigue. She denies any fever. She has had intermittent chest pain since the diagnosis of PE and occasionally takes hydrocodone. She denies any chest pain today. Symptoms are described as mild in severity. There no other associated systemic symptoms.  Past Medical History  Diagnosis Date  . Anxiety   . Bipolar 1 disorder     rapid cycler-- Dr Wellington Hampshire  . Hypertension   . Hypothyroidism     Dr Sidney Ace ([place])  . Water retention   . GERD (gastroesophageal reflux disease)     GI Dr Eulah Pont  . Interstitial cystitis     Past Surgical History  Procedure Date  . Cholecystectomy 2004    Family History  Problem Relation Age of Onset  . Anxiety disorder Other   . Depression Other   . Hypertension Other     History  Substance Use Topics  . Smoking status: Never Smoker   . Smokeless tobacco: Not on file  . Alcohol Use: No    OB History    Grav Para Term Preterm Abortions TAB SAB Ect Mult Living                  Review of Systems ROS reviewed and otherwise negative except for mentioned in HPI Allergies  Relpax  Home Medications   Current Outpatient Rx  Name Route Sig Dispense Refill  .  CLONIDINE HCL 0.2 MG PO TABS Oral Take 0.2 mg by mouth at bedtime.      Marland Kitchen ESOMEPRAZOLE MAGNESIUM 40 MG PO CPDR Oral Take 40 mg by mouth daily before breakfast.      . FERROUS SULFATE 325 (65 FE) MG PO TABS Oral Take 1 tablet (325 mg total) by mouth daily. 30 tablet 3  . FUROSEMIDE 20 MG PO TABS Oral Take 1 tablet (20 mg total) by mouth daily. 30 tablet 3  . GABAPENTIN 300 MG PO CAPS Oral Take by mouth. Take 3 caps po at bedtime     . HYDROCODONE-ACETAMINOPHEN 5-325 MG PO TABS Oral Take 2 tablets by mouth 2 (two) times daily as needed for pain. 40 tablet 0  . HYDROQUINONE 4 % EX CREA Topical Apply topically 2 (two) times daily. 30 g 0  . LEVOTHYROXINE SODIUM 25 MCG PO TABS Oral Take 25 mcg by mouth daily.      Marland Kitchen LORAZEPAM 1 MG PO TABS Oral Take by mouth. Take 2 tabs po at bedtime     . METFORMIN HCL 500 MG PO TABS Oral Take 500 mg by mouth 2 (two) times daily with a meal.      . PROMETHAZINE HCL 25 MG PO TABS Oral Take 25 mg by mouth every 6 (six) hours as needed.      Marland Kitchen SPIRONOLACTONE PO Oral Take  by mouth.      . VERAPAMIL HCL ER 240 MG PO CP24 Oral Take 240 mg by mouth at bedtime.      . WARFARIN SODIUM 1 MG PO TABS Oral Take 7.5 tablets (7.5 mg total) by mouth as directed. 30 tablet 0  . WARFARIN SODIUM 5 MG PO TABS  Take (1 1/2)  tabs of the 5 mg warfarin daily for total of 7.5 mg daily. 45 tablet 0  . ZOLMITRIPTAN 5 MG PO TABS Oral Take 1 tablet (5 mg total) by mouth as needed for migraine. 10 tablet 0  . ZOLPIDEM TARTRATE 10 MG PO TABS Oral Take 10 mg by mouth at bedtime as needed.        BP 147/93  Pulse 108  Temp(Src) 98.5 F (36.9 C) (Oral)  Resp 16  Ht 5\' 4"  (1.626 m)  Wt 222 lb 3.2 oz (100.789 kg)  BMI 38.14 kg/m2  SpO2 98%  LMP 02/17/2011 Vitals reviewed Physical Exam Physical Examination: General appearance - alert, well appearing, and in no distress Mental status - alert, oriented to person, place, and time Mouth - mucous membranes moist, pharynx normal without  lesions Chest - clear to auscultation, no wheezes, rales or rhonchi, symmetric air entry Heart - normal rate, regular rhythm, normal S1, S2, no murmurs, rubs, clicks or gallops, tachycardic Abdomen - soft, nontender, nondistended, no masses or organomegaly Back exam - full range of motion, no tenderness, palpable spasm or pain on motion Musculoskeletal - no joint tenderness, deformity or swelling Extremities - peripheral pulses normal, no pedal edema, no clubbing or cyanosis Skin - normal coloration and turgor, no rashes, no suspicious skin lesions noted  ED Course  Procedures (including critical care time)  Labs Reviewed  PROTIME-INR - Abnormal; Notable for the following:    Prothrombin Time 21.8 (*)    INR 1.86 (*)    All other components within normal limits  BASIC METABOLIC PANEL - Abnormal; Notable for the following:    Sodium 134 (*)    Glucose, Bld 102 (*)    GFR calc non Af Amer 80 (*)    All other components within normal limits   Dg Chest 2 View  03/03/2011  *RADIOLOGY REPORT*  Clinical Data: Short of breath  CHEST - 2 VIEW  Comparison: None  Findings: The heart size and mediastinal contours are within normal limits.  Both lungs are clear.  The visualized skeletal structures are unremarkable.  IMPRESSION: No active cardiopulmonary abnormalities.  Original Report Authenticated By: Rosealee Albee, M.D.   Ct Angio Chest W/cm &/or Wo Cm  03/03/2011  *RADIOLOGY REPORT*  Clinical Data:  Chest tightness, shortness of breath, history of PE  CT ANGIOGRAPHY CHEST WITH CONTRAST  Technique:  Multidetector CT imaging of the chest was performed using the standard protocol during bolus administration of intravenous contrast.  Multiplanar CT image reconstructions including MIPs were obtained to evaluate the vascular anatomy.  Contrast: OMNIPAQUE IOHEXOL 350 MG/ML IV SOLN  Comparison:  Chest radiograph dated 03/03/2011  Findings:  No evidence of pulmonary embolism.  Lungs are  essentially clear. No pleural effusion or pneumothorax.  Visualized thyroid is unremarkable.  The heart is normal in size.  No pericardial effusion.  No suspicious mediastinal, hilar, or axillary lymphadenopathy.  Visualized upper abdomen is notable for cholecystectomy clips.  Mild degenerative changes of the visualized thoracolumbar spine.  Review of the MIP images confirms the above findings.  IMPRESSION: No evidence of pulmonary embolism.  No evidence of  acute cardiopulmonary disease.  Original Report Authenticated By: Charline Bills, M.D.     1. Shortness of breath       MDM  Patient with history of recently diagnosed PE presenting with sensation of difficulty breathing. A chest x-ray revealed no acute abnormalities. Her INR was checked and was not therapeutic at 1.8. Therefore a CT angiogram for chest was obtained which showed no significant pulmonary embolism. Patient advised to continue her Coumadin and continue following up for her INR checks as scheduled. She was given strict return precautions and is agreeable with the plan for discharge.       Ethelda Chick, MD 03/03/11 [phone]

## 2011-03-03 NOTE — ED Notes (Signed)
Pt reports admitted October 6 for PE. Pt reports two weeks ago became increasingly sensitive to smells, especially smoke. Pt states her chest becomes tight and she feels short of breath. Pt denies pain at this time. Also reports sore throat.

## 2011-03-03 NOTE — ED Notes (Signed)
D/c home with family. No rx given

## 2011-03-06 ENCOUNTER — Encounter: Payer: Self-pay | Admitting: Family Medicine

## 2011-03-06 ENCOUNTER — Ambulatory Visit (INDEPENDENT_AMBULATORY_CARE_PROVIDER_SITE_OTHER): Payer: 59 | Admitting: Family Medicine

## 2011-03-06 ENCOUNTER — Ambulatory Visit: Payer: Self-pay | Admitting: Family Medicine

## 2011-03-06 ENCOUNTER — Ambulatory Visit: Payer: 59

## 2011-03-06 VITALS — BP 134/82 | HR 71 | Temp 98.6°F | Wt 224.0 lb

## 2011-03-06 DIAGNOSIS — I2699 Other pulmonary embolism without acute cor pulmonale: Secondary | ICD-10-CM

## 2011-03-06 DIAGNOSIS — J40 Bronchitis, not specified as acute or chronic: Secondary | ICD-10-CM

## 2011-03-06 LAB — POCT INR: INR: 2.7

## 2011-03-06 MED ORDER — WARFARIN SODIUM 6 MG PO TABS: 6.0000 mg | ORAL_TABLET | Freq: Every day | ORAL | Status: DC

## 2011-03-06 MED ORDER — WARFARIN SODIUM 1 MG PO TABS: 1.0000 mg | ORAL_TABLET | ORAL | Status: DC

## 2011-03-06 NOTE — Progress Notes (Signed)
  Subjective:    Patient ID: Diane [name], female    DOB: 15-Feb-1971, 40 y.o.   MRN: [id number]  HPI Cough is productive for about 2 weeks.  Mild ST and raspy voice. No no nasal congestion.  Went to ED and had neg CXR. Using chloroseptic spray  Throat has been itching as well. Using Halls.  No antiobiotics.  No allergy medications.  Review of Systems     Objective:   Physical Exam  Constitutional: She is oriented to person, place, and time. She appears well-developed and well-nourished.       HER VOICE SOUNDS RASPY TODAY.   HENT:  Head: Normocephalic and atraumatic.  Right Ear: External ear normal.  Left Ear: External ear normal.  Nose: Nose normal.  Mouth/Throat: Oropharynx is clear and moist.       TMs and canals are clear.   Eyes: Conjunctivae and EOM are normal. Pupils are equal, round, and reactive to light.  Neck: Neck supple. No thyromegaly present.  Cardiovascular: Normal rate, regular rhythm and normal heart sounds.   Pulmonary/Chest: Effort normal and breath sounds normal. She has no wheezes.  Lymphadenopathy:    She has no cervical adenopathy.  Neurological: She is alert and oriented to person, place, and time.  Skin: Skin is warm and dry.  Psychiatric: She has a normal mood and affect.          Assessment & Plan:  Bronchitis-this is likely viral. She's had a negative chest x-ray and she has no sinus symptoms whatsoever. I explained to her this may take another week for her to come completely improved. At this point in time I can certainly give her cough medication and she can use at bedtime if needed. Also recommend adding an allergy medications and she is having itching in her ears in her throat. She can start an over-the-counter Claritin or Allegra. We also gave her a sample of Astelin to use, which she has used in the past and which does seem to help her allergies.  PE-please see entire coagulation flow sheet. She would like to switch to 6 mg and 1 mg tabs  instead of having to cut  The tablet in half.  New prescriptions were sent her pharmacy for Coumadin.

## 2011-03-06 NOTE — Patient Instructions (Signed)
Can start claritin once  A day Restart your astepro.   If not better in one week then call the office.   Can use delsym OTC for cough.

## 2011-03-07 ENCOUNTER — Telehealth: Payer: Self-pay | Admitting: *Deleted

## 2011-03-07 NOTE — Telephone Encounter (Signed)
Husband calls this morning and says the warfarin that was sent caused her to vomit and nausea. She needs the brand name COumadin sent over to her pharmacy

## 2011-03-07 NOTE — Telephone Encounter (Signed)
OK to change. May need to cal the pharmacy and give a verbal.

## 2011-03-07 NOTE — Telephone Encounter (Signed)
Pt notified and meds called to [facility]

## 2011-03-13 ENCOUNTER — Ambulatory Visit (INDEPENDENT_AMBULATORY_CARE_PROVIDER_SITE_OTHER): Payer: 59 | Admitting: Family Medicine

## 2011-03-13 DIAGNOSIS — I2699 Other pulmonary embolism without acute cor pulmonale: Secondary | ICD-10-CM

## 2011-03-13 LAB — POCT INR: INR: 3.5

## 2011-03-13 NOTE — Progress Notes (Signed)
  Subjective:    Patient ID: Diane [name], female    DOB: [birth year], 40 y.o.   MRN: [id number]  HPI INR needed   Review of Systems     Objective:   Physical Exam        Assessment & Plan:

## 2011-03-19 ENCOUNTER — Ambulatory Visit: Payer: 59 | Admitting: Family Medicine

## 2011-03-20 ENCOUNTER — Ambulatory Visit (INDEPENDENT_AMBULATORY_CARE_PROVIDER_SITE_OTHER): Payer: 59 | Admitting: Family Medicine

## 2011-03-20 VITALS — BP 136/88 | HR 115

## 2011-03-20 DIAGNOSIS — I2699 Other pulmonary embolism without acute cor pulmonale: Secondary | ICD-10-CM

## 2011-03-20 NOTE — Progress Notes (Signed)
  Subjective:    Patient ID: Diane [name], female    DOB: [birth year], 40 y.o.   MRN: [id number]  HPI INR for coumadin therapy    Review of Systems     Objective:   Physical Exam        Assessment & Plan:

## 2011-03-27 ENCOUNTER — Encounter: Payer: Self-pay | Admitting: Family Medicine

## 2011-03-27 ENCOUNTER — Ambulatory Visit: Payer: Self-pay | Admitting: Family Medicine

## 2011-03-27 ENCOUNTER — Ambulatory Visit (INDEPENDENT_AMBULATORY_CARE_PROVIDER_SITE_OTHER): Payer: 59 | Admitting: Family Medicine

## 2011-03-27 DIAGNOSIS — G43909 Migraine, unspecified, not intractable, without status migrainosus: Secondary | ICD-10-CM

## 2011-03-27 DIAGNOSIS — E669 Obesity, unspecified: Secondary | ICD-10-CM

## 2011-03-27 DIAGNOSIS — I2699 Other pulmonary embolism without acute cor pulmonale: Secondary | ICD-10-CM

## 2011-03-27 DIAGNOSIS — J40 Bronchitis, not specified as acute or chronic: Secondary | ICD-10-CM

## 2011-03-27 MED ORDER — SULFAMETHOXAZOLE-TRIMETHOPRIM 800-160 MG PO TABS: 1.0000 | ORAL_TABLET | Freq: Two times a day (BID) | ORAL | Status: AC

## 2011-03-27 NOTE — Patient Instructions (Signed)
Plan is keep up the exercise. Start a calorie count. And dec soda to no more than 48 ounces, and make sure drinking plenty of water.

## 2011-03-27 NOTE — Progress Notes (Signed)
  Subjective:    Patient ID: Diane [name], female    DOB: 1970/08/26, 40 y.o.   MRN: [id number]  HPI Still has cough for over a month.  Cough is productive. Has been taking the zyrtec. Has been getting more HA.  She's noticed wheezing more recently. She denies any fever. She has tried several over-the-counter cough medications and they do not seem to be helping. Occasionally feel short of breath. She does have OSA.  Obesity - she has very been seen at [facility] for possible weight loss surgery. She's been followed for 6 months before they can pursue surgery. She would like to start her monthly weight and blood pressure checks in addition to adjusting her diet and exercise. Still doing pilates 2 days a week and some yoga 2-3 days per week. She has been trying to dec her soda intake. She drinks about 70 ounces a day of soda, this is regular coke.  Not keeping a calorie count right now.   Review of Systems     Objective:   Physical Exam  Constitutional: She is oriented to person, place, and time. She appears well-developed and well-nourished.  HENT:  Head: Normocephalic and atraumatic.  Right Ear: External ear normal.  Left Ear: External ear normal.  Nose: Nose normal.  Mouth/Throat: Oropharynx is clear and moist.       TMs and canals are clear.   Eyes: Conjunctivae and EOM are normal. Pupils are equal, round, and reactive to light.  Neck: Neck supple. No thyromegaly present.  Cardiovascular: Normal rate, regular rhythm and normal heart sounds.   Pulmonary/Chest: Effort normal. She has no wheezes.       Slight upper respiratory wheeze in the right and left upper lobes.  Lymphadenopathy:    She has no cervical adenopathy.  Neurological: She is alert and oriented to person, place, and time.  Skin: Skin is warm and dry.  Psychiatric: She has a normal mood and affect.          Assessment & Plan:  Bronchitis - she is held off on taking antibiotic for the last month. She is  finding that her until she may be getting worse and she has noticed no recent wheezing. No prior history of asthma. I will go ahead and place her on antibiotic. We can also consider inhaler if coughing persists. Consider other causes such as reflux and she continues to not get better. We have not performed chest x-rar, so this would be reasonable as well if she does not improve.  Obsiety - class II obesity. Her blood pressure is at goal. Plan is keep up the exercise. Start a calorie count. And dec soda to no more than 48 ounces, and make sure drinking plenty of water. Followup in one month. We will fax a copy of this office visit [facility].  Diane [name] was supposed to be referred to [facility] clinic because she has tried several prophylactic medications and none have seemed to work well for her. I am actually going to schedule her with her headache specialist in our building and  Who also incorporates a holistic approach.

## 2011-04-10 ENCOUNTER — Ambulatory Visit (INDEPENDENT_AMBULATORY_CARE_PROVIDER_SITE_OTHER): Payer: 59 | Admitting: Family Medicine

## 2011-04-10 VITALS — BP 124/77 | HR 107

## 2011-04-10 DIAGNOSIS — I2699 Other pulmonary embolism without acute cor pulmonale: Secondary | ICD-10-CM

## 2011-04-10 NOTE — Progress Notes (Signed)
  Subjective:    Patient ID: Diane [name], female    DOB: 03/01/1971, 40 y.o.   MRN: [id number] PT/INR check HPI    Review of Systems     Objective:   Physical Exam        Assessment & Plan:

## 2011-04-19 ENCOUNTER — Ambulatory Visit: Payer: 59

## 2011-04-21 ENCOUNTER — Other Ambulatory Visit: Payer: Self-pay | Admitting: Family Medicine

## 2011-04-26 ENCOUNTER — Ambulatory Visit: Payer: 59 | Admitting: Family Medicine

## 2011-04-26 DIAGNOSIS — E039 Hypothyroidism, unspecified: Secondary | ICD-10-CM | POA: Diagnosis not present

## 2011-04-26 DIAGNOSIS — E282 Polycystic ovarian syndrome: Secondary | ICD-10-CM | POA: Diagnosis not present

## 2011-04-27 ENCOUNTER — Telehealth: Payer: Self-pay | Admitting: *Deleted

## 2011-04-27 NOTE — Telephone Encounter (Signed)
Salt water gargles and zinc lozenges

## 2011-04-27 NOTE — Telephone Encounter (Signed)
Pt states she has sore/itchy throat and not feeling well. She is on Coumadin and would like to know what she can take OTC. Been using Chloraseptic already. Please advise. MA, LPN

## 2011-04-27 NOTE — Telephone Encounter (Signed)
Pt informed of instructions. MA, LPN

## 2011-05-08 ENCOUNTER — Ambulatory Visit: Payer: Self-pay | Admitting: Family Medicine

## 2011-05-08 ENCOUNTER — Ambulatory Visit (INDEPENDENT_AMBULATORY_CARE_PROVIDER_SITE_OTHER): Payer: 59 | Admitting: Family Medicine

## 2011-05-08 ENCOUNTER — Ambulatory Visit: Payer: 59

## 2011-05-08 ENCOUNTER — Encounter: Payer: Self-pay | Admitting: Family Medicine

## 2011-05-08 VITALS — BP 117/77 | HR 83 | Wt 228.0 lb

## 2011-05-08 DIAGNOSIS — I2699 Other pulmonary embolism without acute cor pulmonale: Secondary | ICD-10-CM | POA: Diagnosis not present

## 2011-05-08 DIAGNOSIS — E669 Obesity, unspecified: Secondary | ICD-10-CM | POA: Diagnosis not present

## 2011-05-08 DIAGNOSIS — Z23 Encounter for immunization: Secondary | ICD-10-CM

## 2011-05-08 LAB — POCT INR: INR: 2.3

## 2011-05-08 NOTE — Patient Instructions (Signed)
Increase exercise to 2-3 days.   Goal of 1800 calories consitantly.   Decrease soda to decrease to 24 ounces.   Decrease red meat.

## 2011-05-08 NOTE — Progress Notes (Signed)
  Subjective:    Patient ID: Diane [name], female    DOB: 1971-03-20, 41 y.o.   MRN: [id number]  HPI Doing yoga and pilates and zumba (2 days a week).  Has actually gained about 6 lbs.  Eating about 1800-2200 a day.  Has decreased her soda from 2 liter to 48 ounces. She recently had her thyroid checked with her endocrinologist and her TSH was at goal.  Wt Readings from Last 3 Encounters:  05/08/11 228 lb (103.42 kg)  03/27/11 225 lb (102.059 kg)  03/06/11 224 lb (101.606 kg)     Review of Systems     Objective:   Physical Exam  Constitutional: She is oriented to person, place, and time. She appears well-developed.  HENT:  Head: Normocephalic and atraumatic.  Cardiovascular: Normal rate, regular rhythm and normal heart sounds.   Pulmonary/Chest: Effort normal and breath sounds normal.  Neurological: She is alert and oriented to person, place, and time.  Skin: Skin is warm and dry.  Psychiatric: She has a normal mood and affect. Her behavior is normal.          Assessment & Plan:  Obesity - Increase exercise to 2-3 days.  Goal 1800calories consitantly.  Decrease soda to decrease to 24 ounces.  I think her sweet beverages are a bit. For her. I also think she is still having problems with portion control. F/U in 1 month.    Tdap given today SEE. Anticoagulation flow sheet.

## 2011-05-15 ENCOUNTER — Institutional Professional Consult (permissible substitution): Payer: 59 | Admitting: Nurse Practitioner

## 2011-05-15 DIAGNOSIS — G43109 Migraine with aura, not intractable, without status migrainosus: Secondary | ICD-10-CM

## 2011-06-12 ENCOUNTER — Ambulatory Visit: Payer: Self-pay | Admitting: Family Medicine

## 2011-06-12 ENCOUNTER — Ambulatory Visit (INDEPENDENT_AMBULATORY_CARE_PROVIDER_SITE_OTHER): Payer: 59 | Admitting: Family Medicine

## 2011-06-12 ENCOUNTER — Encounter: Payer: Self-pay | Admitting: Family Medicine

## 2011-06-12 VITALS — BP 125/88 | HR 96 | Wt 224.0 lb

## 2011-06-12 DIAGNOSIS — I2699 Other pulmonary embolism without acute cor pulmonale: Secondary | ICD-10-CM

## 2011-06-12 DIAGNOSIS — E669 Obesity, unspecified: Secondary | ICD-10-CM

## 2011-06-12 NOTE — Progress Notes (Signed)
  Subjective:    Patient ID: Diane [name], female    DOB: 1970/04/26, 41 y.o.   MRN: [id number]  HPI Obesity - Has lost 4 lbs.  Last week didn't work out, but was working out 2 days a week.  Has been more depressed and has f/u tomorrow with her counselor and psych.  Has been cooking more at home. Hasn't been able to decrease her soda. Has been drinking 98 ounces on average.    PE- Taking her coumadin No problems. Due for recheck today.    Review of Systems     Objective:   Physical Exam  Constitutional: She is oriented to person, place, and time. She appears well-developed and well-nourished.  HENT:  Head: Normocephalic and atraumatic.  Cardiovascular: Normal rate, regular rhythm and normal heart sounds.   Pulmonary/Chest: Effort normal and breath sounds normal.  Neurological: She is alert and oriented to person, place, and time.  Skin: Skin is warm and dry.  Psychiatric: She has a normal mood and affect. Her behavior is normal.          Assessment & Plan:  Obesity - Continue to work on decrease soda and cooking at home.  Try to increase to workout for 3 days per week.  BP looks great. F/U in 1 months. Increase water.  Starting day off with water with  Breakfast.  Discussed adding protein to the breakfast.    See anticoag flowsheet.

## 2011-06-12 NOTE — Patient Instructions (Signed)
Continue to work on decrease soda and cooking at home.   Try to increase to workout for 3 days per week.  BP looks great. F/U in 1 months.  Increase water.  Starting day off with water with  Breakfast.   Discussed adding protein to the breakfast.

## 2011-07-05 ENCOUNTER — Encounter: Payer: Self-pay | Admitting: Family Medicine

## 2011-07-05 ENCOUNTER — Ambulatory Visit: Payer: Self-pay | Admitting: Family Medicine

## 2011-07-05 ENCOUNTER — Other Ambulatory Visit: Payer: Self-pay | Admitting: Family Medicine

## 2011-07-05 ENCOUNTER — Ambulatory Visit (INDEPENDENT_AMBULATORY_CARE_PROVIDER_SITE_OTHER): Payer: 59 | Admitting: Family Medicine

## 2011-07-05 VITALS — BP 142/98 | HR 96 | Ht 64.5 in | Wt 224.0 lb

## 2011-07-05 DIAGNOSIS — R1013 Epigastric pain: Secondary | ICD-10-CM

## 2011-07-05 DIAGNOSIS — I2699 Other pulmonary embolism without acute cor pulmonale: Secondary | ICD-10-CM

## 2011-07-05 DIAGNOSIS — R1011 Right upper quadrant pain: Secondary | ICD-10-CM

## 2011-07-05 DIAGNOSIS — E669 Obesity, unspecified: Secondary | ICD-10-CM

## 2011-07-05 DIAGNOSIS — M25572 Pain in left ankle and joints of left foot: Secondary | ICD-10-CM

## 2011-07-05 DIAGNOSIS — M25579 Pain in unspecified ankle and joints of unspecified foot: Secondary | ICD-10-CM

## 2011-07-05 LAB — CBC WITH DIFFERENTIAL/PLATELET
Basophils Absolute: 0 10*3/uL (ref 0.0–0.1)
Basophils Relative: 0 % (ref 0–1)
Eosinophils Absolute: 0 10*3/uL (ref 0.0–0.7)
Eosinophils Relative: 0 % (ref 0–5)
HCT: 39.8 % (ref 36.0–46.0)
Hemoglobin: 12.6 g/dL (ref 12.0–15.0)
MCH: 24 pg — ABNORMAL LOW (ref 26.0–34.0)
MCHC: 31.7 g/dL (ref 30.0–36.0)
MCV: 75.7 fL — ABNORMAL LOW (ref 78.0–100.0)
Monocytes Absolute: 0.6 10*3/uL (ref 0.1–1.0)
Monocytes Relative: 7 % (ref 3–12)
Neutro Abs: 5.2 10*3/uL (ref 1.7–7.7)
RDW: 16.2 % — ABNORMAL HIGH (ref 11.5–15.5)

## 2011-07-05 LAB — POCT INR: INR: 2.7

## 2011-07-05 NOTE — Progress Notes (Signed)
  Subjective:    Patient ID: Diane [name], female    DOB: 28-Dec-1970, 41 y.o.   MRN: [id number]  HPI She has ankle pain for one month for her left.  Has been wearing a brace.  Work out 2 days per week for about 2 hours.  Has been trying to walking too but hasn't been able to do it secondary to time and stress.  No meds for pain relief.    Ate na apple yesterday for lunch and had a pain in her right side.  Says woke up feeling disoriented this AM. Say shas been sneezing and water eyes and took a zyrtec. Had diverticulitis years ago.  Pain the the RUQ.  She not longer has GB. Has been belching today. Taking nexium daily.  No SOB.  Has mild cough from post nasal drip.  Feel warm right now.  No N/V/D or bowel changes.     Review of Systems     Objective:   Physical Exam  Constitutional: She is oriented to person, place, and time. She appears well-developed and well-nourished.  HENT:  Head: Normocephalic and atraumatic.  Cardiovascular: Normal rate, regular rhythm and normal heart sounds.   Pulmonary/Chest: Effort normal and breath sounds normal.  Musculoskeletal:       Left ankle with NROM and strength 5/5. Tender over the anterior ankle. No swelling. No laxity.   Neurological: She is alert and oriented to person, place, and time.  Skin: Skin is warm and dry.  Psychiatric: She has a normal mood and affect. Her behavior is normal.          Assessment & Plan:  RUQ Pain - Will check a CBC and CMP. Check amylaste and lipase send actually tender in the epigastric. Increase nexium to bid.  REally needs to work on dec soda.  Will call with results. Given a H.O. On reflux.   Left ankle pain- Discussed avoiding jumping exercises and elevate and rest the ankle when able. Continue to use her brace when exerciseing or walking.  Cna use tylenol if needed.   Obesity - Work on decreasing soda. She is still really struggling with this. INcrese exercise to 3 days per week.    PE - See  coumadin flowsheet for adjustment.

## 2011-07-05 NOTE — Patient Instructions (Signed)
Increase nexium twice a day.   Gastroesophageal Reflux Disease, Adult Gastroesophageal reflux disease (GERD) happens when acid from your stomach flows up into the esophagus. When acid comes in contact with the esophagus, the acid causes soreness (inflammation) in the esophagus. Over time, GERD may create small holes (ulcers) in the lining of the esophagus. CAUSES    Increased body weight. This puts pressure on the stomach, making acid rise from the stomach into the esophagus.   Smoking. This increases acid production in the stomach.   Drinking alcohol. This causes decreased pressure in the lower esophageal sphincter (valve or ring of muscle between the esophagus and stomach), allowing acid from the stomach into the esophagus.   Late evening meals and a full stomach. This increases pressure and acid production in the stomach.   A malformed lower esophageal sphincter.  Sometimes, no cause is found. SYMPTOMS    Burning pain in the lower part of the mid-chest behind the breastbone and in the mid-stomach area. This may occur twice a week or more often.   Trouble swallowing.   Sore throat.   Dry cough.   Asthma-like symptoms including chest tightness, shortness of breath, or wheezing.  DIAGNOSIS   Your caregiver may be able to diagnose GERD based on your symptoms. In some cases, X-rays and other tests may be done to check for complications or to check the condition of your stomach and esophagus. TREATMENT   Your caregiver may recommend over-the-counter or prescription medicines to help decrease acid production. Ask your caregiver before starting or adding any new medicines.   HOME CARE INSTRUCTIONS    Change the factors that you can control. Ask your caregiver for guidance concerning weight loss, quitting smoking, and alcohol consumption.   Avoid foods and drinks that make your symptoms worse, such as:   Caffeine or alcoholic drinks.   Chocolate.   Peppermint or mint flavorings.     Garlic and onions.   Spicy foods.   Citrus fruits, such as oranges, lemons, or limes.   Tomato-based foods such as sauce, chili, salsa, and pizza.   Fried and fatty foods.   Avoid lying down for the 3 hours prior to your bedtime or prior to taking a nap.   Eat small, frequent meals instead of large meals.   Wear loose-fitting clothing. Do not wear anything tight around your waist that causes pressure on your stomach.   Raise the head of your bed 6 to 8 inches with wood blocks to help you sleep. Extra pillows will not help.   Only take over-the-counter or prescription medicines for pain, discomfort, or fever as directed by your caregiver.   Do not take aspirin, ibuprofen, or other nonsteroidal anti-inflammatory drugs (NSAIDs).  SEEK IMMEDIATE MEDICAL CARE IF:    You have pain in your arms, neck, jaw, teeth, or back.   Your pain increases or changes in intensity or duration.   You develop nausea, vomiting, or sweating (diaphoresis).   You develop shortness of breath, or you faint.   Your vomit is green, yellow, black, or looks like coffee grounds or blood.   Your stool is red, bloody, or black.  These symptoms could be signs of other problems, such as heart disease, gastric bleeding, or esophageal bleeding. MAKE SURE YOU:    Understand these instructions.   Will watch your condition.   Will get help right away if you are not doing well or get worse.  Document Released: 01/17/2005 Document Revised: 03/29/2011 Document Reviewed: 10/27/2010  ExitCare Patient Information 2012 [place].

## 2011-07-06 LAB — COMPLETE METABOLIC PANEL WITH GFR
ALT: 11 U/L (ref 0–35)
Albumin: 4.5 g/dL (ref 3.5–5.2)
Alkaline Phosphatase: 112 U/L (ref 39–117)
CO2: 20 mEq/L (ref 19–32)
GFR, Est Non African American: 70 mL/min
Glucose, Bld: 89 mg/dL (ref 70–99)
Potassium: 4.2 mEq/L (ref 3.5–5.3)
Sodium: 135 mEq/L (ref 135–145)
Total Protein: 7.5 g/dL (ref 6.0–8.3)

## 2011-07-10 ENCOUNTER — Ambulatory Visit: Payer: 59 | Admitting: Family Medicine

## 2011-07-21 ENCOUNTER — Other Ambulatory Visit: Payer: Self-pay | Admitting: Family Medicine

## 2011-07-24 ENCOUNTER — Encounter: Payer: Self-pay | Admitting: Nurse Practitioner

## 2011-07-24 ENCOUNTER — Ambulatory Visit (INDEPENDENT_AMBULATORY_CARE_PROVIDER_SITE_OTHER): Payer: 59 | Admitting: Nurse Practitioner

## 2011-07-24 DIAGNOSIS — F319 Bipolar disorder, unspecified: Secondary | ICD-10-CM | POA: Diagnosis not present

## 2011-07-24 DIAGNOSIS — G43009 Migraine without aura, not intractable, without status migrainosus: Secondary | ICD-10-CM | POA: Diagnosis not present

## 2011-07-24 NOTE — Patient Instructions (Signed)

## 2011-07-24 NOTE — Progress Notes (Signed)
Diagnosis: Migraine without aura. Pulmonary Emboli. PCOS. HTN. IBS. Bipolar Disorder. Sleep Apnea.   History: First Visit for this patient with above diagnosis.She has had migraine since she was 41  yo.  She is on multiple meds for above issues. As far as migraine is concerned she is on 3 medications that we use for prevention including Elavil, Neurontin and Verapamil. She rarely uses Zomig when she gets a migraine and mostly uses pain meds.  Location: Bilateral temple or occipital areas  Number of Headache days/month: Severe: 2 Moderate:15 / that can last 4-24 hours Mild:13  Current Outpatient Prescriptions on File Prior to Visit  Medication Sig Dispense Refill  . amitriptyline (ELAVIL) 10 MG tablet Take 1 tablet by mouth At bedtime.      . cloNIDine (CATAPRES) 0.2 MG tablet Take 0.2 mg by mouth at bedtime.        Marland Kitchen esomeprazole (NEXIUM) 40 MG capsule Take 40 mg by mouth daily before breakfast.        . furosemide (LASIX) 20 MG tablet TAKE 1 TABLET BY MOUTH EVERY DAY  30 tablet  2  . gabapentin (NEURONTIN) 300 MG capsule Take by mouth. Take 3 caps po at bedtime       . HYDROcodone-acetaminophen (NORCO) 5-325 MG per tablet Take 2 tablets by mouth 2 (two) times daily as needed for pain.  40 tablet  0  . hydroquinone 4 % cream Apply topically 2 (two) times daily.  30 g  0  . L-Methylfolate (DEPLIN) 15 MG TABS Take by mouth.        . levothyroxine (SYNTHROID, LEVOTHROID) 25 MCG tablet Take 25 mcg by mouth daily.        Marland Kitchen LORazepam (ATIVAN) 1 MG tablet Take by mouth. Take 2 tabs po at bedtime       . metFORMIN (GLUCOPHAGE) 500 MG tablet Take 500 mg by mouth 2 (two) times daily with a meal.        . promethazine (PHENERGAN) 25 MG tablet Take 25 mg by mouth every 6 (six) hours as needed.        . verapamil (VERELAN PM) 240 MG 24 hr capsule Take 240 mg by mouth at bedtime.        Marland Kitchen warfarin (COUMADIN) 1 MG tablet Take 1 tablet (1 mg total) by mouth as directed.  30 tablet  0  . warfarin  (COUMADIN) 6 MG tablet Take 1 tablet (6 mg total) by mouth daily. As directed.  30 tablet  6  . WELLBUTRIN SR 100 MG 12 hr tablet Take 1 tablet by mouth daily.      Marland Kitchen zolpidem (AMBIEN) 10 MG tablet Take 10 mg by mouth at bedtime as needed.        . ferrous sulfate 325 (65 FE) MG tablet Take 1 tablet (325 mg total) by mouth daily.  30 tablet  3    Acute prevention: Elavil, Verapamil, Neurontin  Past Medical History  Diagnosis Date  . Anxiety   . Bipolar 1 disorder     rapid cycler-- Dr Wellington Hampshire  . Hypertension   . Hypothyroidism     Dr Sidney Ace ([place])  . Water retention   . GERD (gastroesophageal reflux disease)     GI Dr Eulah Pont  . Interstitial cystitis   . Migraine   . Pulmonary embolism 01/25/11   Past Surgical History  Procedure Date  . Cholecystectomy 2004   Family History  Problem Relation Age of Onset  . Anxiety disorder Other   .  Depression Other   . Hypertension Other   . Diabetes Mother   . Urolithiasis Father    Social History:  reports that she has never smoked. She does not have any smokeless tobacco history on file. She reports that she drinks alcohol. She reports that she does not use illicit drugs. Allergies:  Allergies  Allergen Reactions  . Relpax (Eletriptan Hydrobromide) Nausea And Vomiting    Triggers: Menses and stress. Has had more stress lately related to husband of 13 years cheating on her.She plans divorce.  Birth control: None. Currently not sexually active  ROS: Positive for insomnia, headache, anxiety  Exam: Well developed,well nourished, obese  AA female  General: NAD HEENT: negative Cardiac: RRR Lungs:clear Neuro:anxious otherwise negative Skin: warm and dry  Impression:migraine - common  Plan: This patient is currently on 17 medications for her multiple diagnosis. She is on 3 that could be considered migraine prevention. I do not feel comfortable starting her on any more medications.  We discussed her stopping Zomig secondary to  history of pulmonary emboli. She understands this is a contraindication and will discontinue. We discussed stopping her menses with Cleta Alberts IUD as menses is a trigger for her migraines. She will consider this. We also discussed Botox as an option for migraine control. She is very interested in this and we will seek pre approval. She will also continue to do life style modifications including exercise and counseling and decreasing her stimulation. She will RTC when we get approval  for Botox.  Time Spent: 45 min

## 2011-07-28 DIAGNOSIS — E119 Type 2 diabetes mellitus without complications: Secondary | ICD-10-CM | POA: Diagnosis not present

## 2011-07-28 DIAGNOSIS — Z86718 Personal history of other venous thrombosis and embolism: Secondary | ICD-10-CM | POA: Diagnosis not present

## 2011-07-28 DIAGNOSIS — Z86711 Personal history of pulmonary embolism: Secondary | ICD-10-CM | POA: Diagnosis not present

## 2011-07-28 DIAGNOSIS — IMO0002 Reserved for concepts with insufficient information to code with codable children: Secondary | ICD-10-CM | POA: Diagnosis not present

## 2011-07-30 ENCOUNTER — Ambulatory Visit: Payer: 59 | Admitting: Family Medicine

## 2011-08-02 ENCOUNTER — Ambulatory Visit: Payer: 59 | Admitting: Family Medicine

## 2011-08-27 ENCOUNTER — Ambulatory Visit: Payer: 59 | Admitting: Family Medicine

## 2011-08-31 ENCOUNTER — Ambulatory Visit: Payer: 59 | Admitting: Family Medicine

## 2011-09-06 ENCOUNTER — Encounter: Payer: Self-pay | Admitting: Family Medicine

## 2011-09-06 ENCOUNTER — Ambulatory Visit: Payer: 59 | Admitting: Family Medicine

## 2011-09-21 ENCOUNTER — Other Ambulatory Visit: Payer: Self-pay | Admitting: Family Medicine

## 2011-09-21 ENCOUNTER — Ambulatory Visit (INDEPENDENT_AMBULATORY_CARE_PROVIDER_SITE_OTHER): Payer: 59 | Admitting: Family Medicine

## 2011-09-21 ENCOUNTER — Encounter: Payer: Self-pay | Admitting: Family Medicine

## 2011-09-21 ENCOUNTER — Ambulatory Visit: Payer: Self-pay | Admitting: Family Medicine

## 2011-09-21 VITALS — BP 131/93 | HR 103 | Temp 98.1°F | Ht 64.5 in | Wt 223.0 lb

## 2011-09-21 DIAGNOSIS — E669 Obesity, unspecified: Secondary | ICD-10-CM

## 2011-09-21 DIAGNOSIS — I2699 Other pulmonary embolism without acute cor pulmonale: Secondary | ICD-10-CM

## 2011-09-21 DIAGNOSIS — I10 Essential (primary) hypertension: Secondary | ICD-10-CM | POA: Diagnosis not present

## 2011-09-21 DIAGNOSIS — K589 Irritable bowel syndrome without diarrhea: Secondary | ICD-10-CM | POA: Diagnosis not present

## 2011-09-21 LAB — POCT INR: INR: 3

## 2011-09-21 MED ORDER — COUMADIN 1 MG PO TABS: 1.0000 mg | ORAL_TABLET | Freq: Every day | ORAL | Status: DC

## 2011-09-21 MED ORDER — LISINOPRIL 20 MG PO TABS: 20.0000 mg | ORAL_TABLET | Freq: Every day | ORAL | Status: DC

## 2011-09-21 NOTE — Progress Notes (Signed)
  Subjective:    Patient ID: Diane [name], female    DOB: 02-Jul-1970, 41 y.o.   MRN: [id number]  HPI She is seperating from her husband.  He cheated on her.  She has been working out 3 days per week and 2 classes.  HAs tried to be more active overall. Still weaning off of soda.  She has been trying to drink more water. She says because her weight is actually up a pound. She is seeing her psychiatrist and working with a counselor for her Bipolar.  She does feel her anxiety is increased. GAD-7 score of 6.    Review of Systems     Objective:   Physical Exam  Constitutional: She is oriented to person, place, and time. She appears well-developed and well-nourished.  HENT:  Head: Normocephalic and atraumatic.  Cardiovascular: Normal rate, regular rhythm and normal heart sounds.   Pulmonary/Chest: Effort normal and breath sounds normal.  Neurological: She is alert and oriented to person, place, and time.  Skin: Skin is warm and dry.  Psychiatric: She has a normal mood and affect. Her behavior is normal.          Assessment & Plan:  Obesity - She has never tried phentermine and is very interested. Dsicusse risks and benefits.  I did explain to her that her blood pressure has been optimal control.  Hypertension-we'll add lisinopril to her regimen. Followup in 2 weeks. If she is at goal at that point in time we can consider starting phentermine. But she will need to follow up in one week after starting it to recheck blood pressure because she is high risk.  Anticoagulation-see Coumadin flow sheet. Followup in one week.

## 2011-09-21 NOTE — Patient Instructions (Signed)
Add lisinopril for better blood pressure control.   Recheck blood pressure in 2 weeks.  Follow up for coumadin in one week.

## 2011-09-28 ENCOUNTER — Ambulatory Visit (INDEPENDENT_AMBULATORY_CARE_PROVIDER_SITE_OTHER): Payer: 59 | Admitting: Family Medicine

## 2011-09-28 VITALS — BP 132/86 | HR 95

## 2011-09-28 DIAGNOSIS — I2699 Other pulmonary embolism without acute cor pulmonale: Secondary | ICD-10-CM | POA: Diagnosis not present

## 2011-09-28 DIAGNOSIS — I824Y9 Acute embolism and thrombosis of unspecified deep veins of unspecified proximal lower extremity: Secondary | ICD-10-CM | POA: Diagnosis not present

## 2011-09-28 DIAGNOSIS — I82419 Acute embolism and thrombosis of unspecified femoral vein: Secondary | ICD-10-CM

## 2011-09-28 LAB — POCT INR: INR: 2.1

## 2011-09-28 NOTE — Progress Notes (Signed)
Patient ID: Diane [name], female   DOB: 1971/01/01, 41 y.o.   MRN: [id number] INR for coumadin therapy.    Pt denies chest pain, SOB, dizziness, or heart palpitations.  Taking meds as directed w/o problems.  Denies medication side effects.  5 min spent with pt.

## 2011-10-04 ENCOUNTER — Other Ambulatory Visit: Payer: Self-pay | Admitting: Family Medicine

## 2011-10-05 ENCOUNTER — Ambulatory Visit: Payer: Self-pay | Admitting: Family Medicine

## 2011-10-05 ENCOUNTER — Encounter: Payer: Self-pay | Admitting: Family Medicine

## 2011-10-05 ENCOUNTER — Ambulatory Visit (INDEPENDENT_AMBULATORY_CARE_PROVIDER_SITE_OTHER): Payer: 59 | Admitting: Family Medicine

## 2011-10-05 VITALS — BP 134/88 | HR 92 | Ht 64.5 in | Wt 217.0 lb

## 2011-10-05 DIAGNOSIS — I2699 Other pulmonary embolism without acute cor pulmonale: Secondary | ICD-10-CM

## 2011-10-05 DIAGNOSIS — E611 Iron deficiency: Secondary | ICD-10-CM

## 2011-10-05 DIAGNOSIS — I10 Essential (primary) hypertension: Secondary | ICD-10-CM | POA: Diagnosis not present

## 2011-10-05 DIAGNOSIS — E669 Obesity, unspecified: Secondary | ICD-10-CM

## 2011-10-05 DIAGNOSIS — D509 Iron deficiency anemia, unspecified: Secondary | ICD-10-CM

## 2011-10-05 LAB — POCT INR: INR: 2

## 2011-10-05 MED ORDER — PHENTERMINE HCL 37.5 MG PO CAPS: 37.5000 mg | ORAL_CAPSULE | ORAL | Status: DC

## 2011-10-05 MED ORDER — FUSION PLUS PO CAPS: 1.0000 | ORAL_CAPSULE | Freq: Every day | ORAL | Status: DC

## 2011-10-05 NOTE — Progress Notes (Signed)
  Subjective:    Patient ID: Diane [name], female    DOB: 1971/01/09, 41 y.o.   MRN: [id number]  HPI Obesity - She has been weaning off the sodas. Has lost 6 lbs.  She is working out several days a week. Workin gon stretches on wed too. He is mostly been doing them the. She says she typically tries to rest on the weekends. She is still interested in trying the phentermine.  Hypertension-she's tolerating lisinopril well without any side effects or problems. No chest pain or shortness of breath.  She says she wonders what her iron is doing. She has been craving ice lately. She had some GI discomfort over-the-counter iron so it was stopped. I encouraged her to try to eat iron rich diet.   Review of Systems     Objective:   Physical Exam  Constitutional: She is oriented to person, place, and time. She appears well-developed and well-nourished.  HENT:  Head: Normocephalic and atraumatic.  Cardiovascular: Normal rate, regular rhythm and normal heart sounds.   Pulmonary/Chest: Effort normal and breath sounds normal.  Neurological: She is alert and oriented to person, place, and time.  Skin: Skin is warm and dry.  Psychiatric: She has a normal mood and affect. Her behavior is normal.          Assessment & Plan:  Obesity - Doing well. Lost 6 more lbs. Will add the phentermine. Continue to work on reducing sodas. F/U 1 months. Warned about potential SE of the medication  continue to work on decreasing soda intake and keep up the regular exercise. She is on fantastic. Monitor blood pressure on the phentermine. If she does not lose weight on it than the medication will be discontinued. I did encourage her to speak with her psychiatrist just to let him know that she will be taking the phentermine. That way if she has any new change he is aware.  Iron deficiency anemia-she thinks her iron is still low. She's been craving ice lately. Her ferritin was low 3 months ago and she has not been  taking a supplement. Due to the nausea her. I gave her samples of fusion plus today to try. Works well we can send in a prescription.  HTN- Controlled but borderline. Continue lisinopril. Continue work on low-salt diet. In part of her for losing 6 pounds. We'll need to keep an eye on this starting phentermine. If her blood pressure elevated then we may need to either reduce the dose on the phentermine or stop it completely.  PE - see anticoagulation flowsheet for adjustments to her Coumadin today. We also discussed that she should consider staying on Coumadin for life since she did not have a preventable cause for her PE.

## 2011-10-05 NOTE — Patient Instructions (Signed)
Call me if the iron capsule works well.

## 2011-10-09 ENCOUNTER — Ambulatory Visit: Payer: 59

## 2011-10-09 ENCOUNTER — Ambulatory Visit (INDEPENDENT_AMBULATORY_CARE_PROVIDER_SITE_OTHER): Payer: 59 | Admitting: Nurse Practitioner

## 2011-10-09 ENCOUNTER — Encounter: Payer: Self-pay | Admitting: Nurse Practitioner

## 2011-10-09 VITALS — BP 119/76 | HR 96 | Temp 97.0°F | Resp 16 | Ht 64.5 in | Wt 214.0 lb

## 2011-10-09 DIAGNOSIS — G43909 Migraine, unspecified, not intractable, without status migrainosus: Secondary | ICD-10-CM

## 2011-10-09 NOTE — Patient Instructions (Signed)

## 2011-10-09 NOTE — Progress Notes (Signed)
S: Pt returns today for follow up on migraine headaches. Since our last visits her headaches are a little better. She has been going to Zumba class and has lost 9 lbs. She and her husband have not reconciled. She is currently not sexually active. She does not want to use Mirena IUD. She did stop the Zomig. She has not gotten approval for Botox as yet and is still waiting for that to arrive.  O:Alert, oriented NAD Nero: negative Cardiac: RRR Lungs : clear  A: Chronic migraine  P: Will await Botox  Continue exercise, counseling RTC with Botox

## 2011-10-12 ENCOUNTER — Ambulatory Visit (INDEPENDENT_AMBULATORY_CARE_PROVIDER_SITE_OTHER): Payer: 59 | Admitting: Family Medicine

## 2011-10-12 VITALS — BP 124/89 | HR 80

## 2011-10-12 DIAGNOSIS — I2699 Other pulmonary embolism without acute cor pulmonale: Secondary | ICD-10-CM

## 2011-10-12 LAB — POCT INR: INR: 1.9

## 2011-10-12 NOTE — Progress Notes (Signed)
Patient ID: Diane [name], female   DOB: 1971-02-22, 41 y.o.   MRN: [id number] INR for coumadin therapy

## 2011-10-15 ENCOUNTER — Telehealth: Payer: Self-pay | Admitting: *Deleted

## 2011-10-15 NOTE — Telephone Encounter (Signed)
Have you tried orlistat/Xenical/Alli for weight loss? If not can try that. It is supposed to absorb fat after fatty meals. You take one hr after meal containing fat. Let me know if you want to try.

## 2011-10-15 NOTE — Telephone Encounter (Signed)
Pt states that she has been taking the iron supplements that she was given but state it makes her nauseated. Encouraged pt to give it a couple of weeks and to make sure she eats before she takes it. She also wanted to let us know that she stopped the Phentermine b/c it increased her anxiety and she started to get sharp needle like pains in chest. Pt would like to know if we can suggest something in place of the Phentermine. Please advise.

## 2011-10-16 NOTE — Telephone Encounter (Signed)
All of these are same medication just under different names. This rx does have an effect on stool making it more greasy because of the fat in it. It may irriate IBS but would have to try to know for sure. The other weight loss meds all have phentermine in them. The only one that doesn't would work on Medical sales representative and since has hx of anxiety I don't think we should mess with that. Could set her up with nutritionist?

## 2011-10-16 NOTE — Telephone Encounter (Signed)
Pt states that she has a nutrionist. States that she will get with her doc that prescribes her anxiety meds and will call back to see if there is something she can take and take enough to keep her anxiety level down. States she will call back after she talks to her other doc.

## 2011-10-16 NOTE — Telephone Encounter (Signed)
Pt states that she does IBS and would like to know if either one is better for her not to have the anxiety and mood swings. States is there any other ones, if not will you send the one that is least issues with the anxiety and mood swings.

## 2011-10-17 ENCOUNTER — Other Ambulatory Visit: Payer: Self-pay | Admitting: Family Medicine

## 2011-10-19 ENCOUNTER — Ambulatory Visit (INDEPENDENT_AMBULATORY_CARE_PROVIDER_SITE_OTHER): Payer: 59 | Admitting: Physician Assistant

## 2011-10-19 VITALS — BP 123/86 | HR 93

## 2011-10-19 DIAGNOSIS — I2699 Other pulmonary embolism without acute cor pulmonale: Secondary | ICD-10-CM

## 2011-10-19 MED ORDER — FERROUS SULFATE 325 (65 FE) MG PO TABS: 325.0000 mg | ORAL_TABLET | Freq: Every day | ORAL | Status: DC

## 2011-10-24 DIAGNOSIS — F3160 Bipolar disorder, current episode mixed, unspecified: Secondary | ICD-10-CM | POA: Diagnosis not present

## 2011-10-26 ENCOUNTER — Ambulatory Visit: Payer: 59

## 2011-10-29 ENCOUNTER — Ambulatory Visit (INDEPENDENT_AMBULATORY_CARE_PROVIDER_SITE_OTHER): Payer: 59 | Admitting: Family Medicine

## 2011-10-29 VITALS — BP 112/82 | HR 114

## 2011-10-29 DIAGNOSIS — R002 Palpitations: Secondary | ICD-10-CM

## 2011-10-29 DIAGNOSIS — I2699 Other pulmonary embolism without acute cor pulmonale: Secondary | ICD-10-CM | POA: Diagnosis not present

## 2011-10-29 NOTE — Progress Notes (Signed)
  Subjective:    Patient ID: Diane [name], female    DOB: 04/26/1970, 41 y.o.   MRN: [id number] INR check. C/O chest pain and palpatations. HR elevated today. HPI    Review of Systems     Objective:   Physical Exam        Assessment & Plan:  Palpitations-will check a d-dimer since she does have a history of blood clot and her INR slightly subtherapeutic today. Also make sure not taking the phentermine avoiding all caffeine or stimulant products including decongestants. If her d-dimer is normal but I like to refer to cardiology for further workup for her palpitations.Normal hemoglobin 3 months ago.

## 2011-10-30 ENCOUNTER — Telehealth: Payer: Self-pay | Admitting: *Deleted

## 2011-10-30 DIAGNOSIS — R002 Palpitations: Secondary | ICD-10-CM

## 2011-10-30 NOTE — Telephone Encounter (Signed)
Pt has called asking if you are going to refer her to a cardiologist and if you think she should exercise or hold off. Please advise.

## 2011-10-30 NOTE — Telephone Encounter (Signed)
Pt informed

## 2011-10-30 NOTE — Telephone Encounter (Signed)
Ok to exercise. Will refer to cards for palpitations.

## 2011-10-31 DIAGNOSIS — F3160 Bipolar disorder, current episode mixed, unspecified: Secondary | ICD-10-CM | POA: Diagnosis not present

## 2011-11-01 DIAGNOSIS — E039 Hypothyroidism, unspecified: Secondary | ICD-10-CM | POA: Diagnosis not present

## 2011-11-01 DIAGNOSIS — E282 Polycystic ovarian syndrome: Secondary | ICD-10-CM | POA: Diagnosis not present

## 2011-11-02 ENCOUNTER — Telehealth: Payer: Self-pay | Admitting: *Deleted

## 2011-11-02 DIAGNOSIS — I2699 Other pulmonary embolism without acute cor pulmonale: Secondary | ICD-10-CM

## 2011-11-02 NOTE — Telephone Encounter (Signed)
Called pt to notify come to office to have INR checked. Pt sttaes can not get here until the afternoon so order sent to lab for STAT INR and will contact MD via cell phone with results.

## 2011-11-02 NOTE — Telephone Encounter (Signed)
Pt calls and states that she is bleeding a lot as if she is on her cycle and this has never happened before. She wants to know what to do. Please advise. Do you want me to put order in for STAT INR with the lab?

## 2011-11-02 NOTE — Telephone Encounter (Signed)
Come in for stat INR.

## 2011-11-05 ENCOUNTER — Other Ambulatory Visit: Payer: Self-pay | Admitting: *Deleted

## 2011-11-05 ENCOUNTER — Ambulatory Visit (INDEPENDENT_AMBULATORY_CARE_PROVIDER_SITE_OTHER): Payer: 59 | Admitting: Family Medicine

## 2011-11-05 VITALS — BP 125/80 | HR 104

## 2011-11-05 DIAGNOSIS — I2699 Other pulmonary embolism without acute cor pulmonale: Secondary | ICD-10-CM

## 2011-11-05 LAB — POCT INR: INR: 1.9

## 2011-11-05 MED ORDER — WARFARIN SODIUM 6 MG PO TABS: 6.0000 mg | ORAL_TABLET | Freq: Every day | ORAL | Status: DC

## 2011-11-05 NOTE — Progress Notes (Signed)
  Subjective:    Patient ID: Diane [name], female    DOB: 01/13/1971, 41 y.o.   MRN: [id number] INR check.Pt states she didn't follow dose schedule this past week due to passing blood clots and not on period. Pt is concerned with elevated HR. Pt goes to cardiology 11/14/11. Concerned after speaking to endocrinologist that exercise and wt loss can affect INR. HPI    Review of Systems     Objective:   Physical Exam        Assessment & Plan:  Her INR is not swinging widely.  She just needs to take her meds consistantly and as recommended which she is not oing right now. That is why we recheck and follow her is so we can adjust while she is exercising and losing weight. OK to exercise today.  If still passing a lot of vaginal clots then can refer to gyn wif she would like.

## 2011-11-06 ENCOUNTER — Other Ambulatory Visit: Payer: Self-pay | Admitting: Family Medicine

## 2011-11-07 DIAGNOSIS — F3160 Bipolar disorder, current episode mixed, unspecified: Secondary | ICD-10-CM | POA: Diagnosis not present

## 2011-11-09 ENCOUNTER — Ambulatory Visit (INDEPENDENT_AMBULATORY_CARE_PROVIDER_SITE_OTHER): Payer: 59 | Admitting: Family Medicine

## 2011-11-09 ENCOUNTER — Encounter: Payer: Self-pay | Admitting: Family Medicine

## 2011-11-09 ENCOUNTER — Ambulatory Visit: Payer: Self-pay | Admitting: Family Medicine

## 2011-11-09 VITALS — BP 125/79 | HR 107 | Ht 64.5 in | Wt 207.0 lb

## 2011-11-09 DIAGNOSIS — I2699 Other pulmonary embolism without acute cor pulmonale: Secondary | ICD-10-CM

## 2011-11-09 DIAGNOSIS — R635 Abnormal weight gain: Secondary | ICD-10-CM | POA: Diagnosis not present

## 2011-11-09 DIAGNOSIS — R42 Dizziness and giddiness: Secondary | ICD-10-CM | POA: Diagnosis not present

## 2011-11-09 LAB — POCT INR: INR: 2.2

## 2011-11-09 NOTE — Progress Notes (Signed)
  Subjective:    Patient ID: Diane [name], female    DOB: Apr 17, 1971, 41 y.o.   MRN: [id number]  HPI Discontinued the phentermine due to heart palpitations. She is up 4 lbs.  BP well controlled. Has lost 7 lbs. Still working out. Soda is down to 24 ounces a day.  Still struggling.    She has still been felling dizzy. Thinks it could be her BP med. Has been on it for 6 weeks now.  Also iron def and we recently started her on iron so could be either one.   Review of Systems     Objective:   Physical Exam  Constitutional: She is oriented to person, place, and time. She appears well-developed and well-nourished.  HENT:  Head: Normocephalic and atraumatic.  Cardiovascular: Normal rate, regular rhythm and normal heart sounds.   Pulmonary/Chest: Effort normal and breath sounds normal.  Neurological: She is alert and oriented to person, place, and time.  Skin: Skin is warm and dry.  Psychiatric: She has a normal mood and affect. Her behavior is normal.          Assessment & Plan:  Abnormal weight gain/obesity - Has lost 7 more lbs.  Doing well.  Work on dec soda to 12 ounces.  Still working out 4 days per week. Working out 2 hours at time.  Hasn't been watching her calories. She hasn't been doing her fitnesPAL. Encouraged her to continue to work on tracking her calories. F/U in one month  Dizziness - this could certainly be coming from her iron deficiency anemia to see if this improves at the next month as she takes her iron more consistently. Also could be related to the lisinopril. Typically if he experiences dizziness it does get better as the first couple weeks. I would like her to cut her tab in half and we will recheck her blood pressure in one month. We'll see if her dizziness and lightheadedness improves.  Anticoag - Check INR today. 2.2 today. See anticoag flowsheet. F/U in 2 weeks.  She says she has a lot of questions and would like to be referred to the Coumadin clinic.  She  would also be a candidate for pradaxa.  She can discuss further with them.

## 2011-11-09 NOTE — Patient Instructions (Signed)
Cut the lisinopril in half.  Recheck blood pressure in one months

## 2011-11-12 ENCOUNTER — Ambulatory Visit: Payer: 59

## 2011-11-14 ENCOUNTER — Ambulatory Visit (INDEPENDENT_AMBULATORY_CARE_PROVIDER_SITE_OTHER): Payer: 59 | Admitting: Cardiology

## 2011-11-14 ENCOUNTER — Encounter: Payer: Self-pay | Admitting: Cardiology

## 2011-11-14 ENCOUNTER — Other Ambulatory Visit: Payer: Self-pay | Admitting: Cardiology

## 2011-11-14 VITALS — BP 127/82 | HR 108 | Wt 212.0 lb

## 2011-11-14 DIAGNOSIS — I10 Essential (primary) hypertension: Secondary | ICD-10-CM

## 2011-11-14 DIAGNOSIS — I2699 Other pulmonary embolism without acute cor pulmonale: Secondary | ICD-10-CM | POA: Diagnosis not present

## 2011-11-14 DIAGNOSIS — R002 Palpitations: Secondary | ICD-10-CM

## 2011-11-14 NOTE — Assessment & Plan Note (Signed)
Plan check echocardiogram for LV function, TSH and CardioNet. We will consider a beta blocker in the future if symptoms worsen.

## 2011-11-14 NOTE — Assessment & Plan Note (Signed)
Continue Coumadin.Management per primary care. 

## 2011-11-14 NOTE — Progress Notes (Signed)
HPI: 41 year old female for evaluation of palpitations. Patient apparently was diagnosed with a pulmonary embolus in October of 2012 in [place]. She has been on Coumadin since then. An echocardiogram per discharge summary showed normal LV function. Patient states that for the past several months she has had intermittent palpitations. They are described as her heart racing. They have awakened her from sleep. There is mild chest discomfort but no dyspnea or syncope. They resolve spontaneously after approximately 30 minutes. She has an occasional pain in her chest that is not exertional. She exercises routinely but no chest pain. She has mild dyspnea on exertion but no orthopnea, PND, pedal edema or history of syncope. Because of her palpitations we were asked to evaluate.  Current Outpatient Prescriptions  Medication Sig Dispense Refill  . amitriptyline (ELAVIL) 10 MG tablet Take 1 tablet by mouth At bedtime.      Marland Kitchen COUMADIN 1 MG tablet Take 1 tablet (1 mg total) by mouth daily.  30 tablet  11  . COUMADIN 6 MG tablet TAKE 1 TABLET BY MOUTH EVERY DAY  30 tablet  0  . COUMADIN 6 MG tablet TAKE 1 TABLET BY MOUTH EVERY DAY  30 tablet  0  . esomeprazole (NEXIUM) 40 MG capsule Take 40 mg by mouth daily before breakfast.        . ferrous sulfate 325 (65 FE) MG tablet Take 1 tablet (325 mg total) by mouth daily.  30 tablet  3  . furosemide (LASIX) 20 MG tablet TAKE 1 TABLET BY MOUTH EVERY DAY  30 tablet  2  . gabapentin (NEURONTIN) 300 MG capsule Take 3 caps po at bedtime      . HYDROcodone-acetaminophen (NORCO) 5-325 MG per tablet Take 2 tablets by mouth 2 (two) times daily as needed for pain.  40 tablet  0  . hydroquinone 4 % cream Apply topically 2 (two) times daily.  30 g  0  . L-Methylfolate-Algae (DEPLIN 15 PO) Take 1 tablet by mouth daily.      Marland Kitchen levothyroxine (SYNTHROID, LEVOTHROID) 25 MCG tablet Take 25 mcg by mouth daily.        Marland Kitchen lisinopril (PRINIVIL,ZESTRIL) 20 MG tablet       . LORazepam  (ATIVAN) 1 MG tablet Take 3 tabs po at bedtime      . metFORMIN (GLUCOPHAGE) 500 MG tablet Take 500 mg by mouth 2 (two) times daily with a meal.        . promethazine (PHENERGAN) 25 MG tablet Take 25 mg by mouth every 6 (six) hours as needed.        Marland Kitchen spironolactone (ALDACTONE) 25 MG tablet Take 25 mg by mouth daily.      . verapamil (VERELAN PM) 240 MG 24 hr capsule Take 240 mg by mouth at bedtime.        Brigitte Pulse SR 100 MG 12 hr tablet Take 1 tablet by mouth daily.      Marland Kitchen zolpidem (AMBIEN) 10 MG tablet Take 10 mg by mouth at bedtime as needed.        Marland Kitchen DISCONTD: lisinopril (PRINIVIL,ZESTRIL) 20 MG tablet TAKE 1 TABLET BY MOUTH EVERY DAY  90 tablet  0    Allergies  Allergen Reactions  . Relpax (Eletriptan Hydrobromide) Nausea And Vomiting    Past Medical History  Diagnosis Date  . Anxiety   . Bipolar 1 disorder     rapid cycler-- Dr Wellington Hampshire  . Hypertension   . Hypothyroidism     Dr  McHugh ([place])  . Water retention   . GERD (gastroesophageal reflux disease)     GI Dr Eulah Pont  . Interstitial cystitis   . Migraine   . Pulmonary embolism 01/25/11  . Polycystic ovarian disease   . Peptic ulcer disease     Past Surgical History  Procedure Date  . Cholecystectomy 2004    History   Social History  . Marital Status: Married    Spouse Name: N/A    Number of Children: N/A  . Years of Education: N/A   Occupational History  . Not on file.   Social History Main Topics  . Smoking status: Never Smoker   . Smokeless tobacco: Not on file  . Alcohol Use: Yes     rarely  . Drug Use: No  . Sexually Active: No   Other Topics Concern  . Not on file   Social History Narrative  . No narrative on file    Family History  Problem Relation Age of Onset  . Anxiety disorder Other   . Depression Other   . Hypertension Other   . Diabetes Mother   . Urolithiasis Father     ROS: no fevers or chills, productive cough, hemoptysis, dysphasia, odynophagia, melena,  hematochezia, dysuria, hematuria, rash, seizure activity, orthopnea, PND, pedal edema, claudication. Remaining systems are negative.  Physical Exam:   Blood pressure 127/82, pulse 108, weight 96.163 kg (212 lb).  General:  Well developed/obese in NAD Skin warm/dry Patient not depressed No peripheral clubbing Back-normal HEENT-normal/normal eyelids Neck supple/normal carotid upstroke bilaterally; no bruits; no JVD; no thyromegaly chest - CTA/ normal expansion CV - RRR/normal S1 and S2; no murmurs, rubs or gallops;  PMI nondisplaced Abdomen -NT/ND, no HSM, no mass, + bowel sounds, no bruit 2+ femoral pulses, no bruits Ext-no edema, chords, 2+ DP Neuro-grossly nonfocal  ECG sinus tachycardia at a rate of 108. Nonspecific ST changes. Cannot rule out prior lateral infarct.

## 2011-11-14 NOTE — Patient Instructions (Addendum)
Your physician recommends that you schedule a follow-up appointment in: 6 WEEKS WITH DR Jens Som IN [place]  Your physician has recommended that you wear an event monitor. Event monitors are medical devices that record the heart's electrical activity. Doctors most often [place] these monitors to diagnose arrhythmias. Arrhythmias are problems with the speed or rhythm of the heartbeat. The monitor is a small, portable device. You can wear one while you do your normal daily activities. This is usually used to diagnose what is causing palpitations/syncope (passing out).   Your physician recommends that you HAVE LAB WORK TODAY   Your physician has requested that you have an echocardiogram. Echocardiography is a painless test that uses sound waves to create images of your heart. It provides your doctor with information about the size and shape of your heart and how well your heart's chambers and valves are working. This procedure takes approximately one hour. There are no restrictions for this procedure.

## 2011-11-14 NOTE — Assessment & Plan Note (Signed)
Blood pressure controlled. Continue present medications. 

## 2011-11-15 ENCOUNTER — Other Ambulatory Visit (HOSPITAL_COMMUNITY): Payer: 59

## 2011-11-15 LAB — TSH: TSH: 0.99 u[IU]/mL (ref 0.350–4.500)

## 2011-11-16 ENCOUNTER — Ambulatory Visit: Payer: 59

## 2011-11-16 ENCOUNTER — Telehealth: Payer: Self-pay | Admitting: *Deleted

## 2011-11-16 NOTE — Telephone Encounter (Signed)
Referral form filled out for Candise Bowens to fax to [facility] [place].

## 2011-11-16 NOTE — Telephone Encounter (Signed)
Pt has called stating she was suppose to be referred to the coumadin clinic. Please advise.

## 2011-11-16 NOTE — Telephone Encounter (Signed)
Please check with Victorino Dike on this.  I couldn't put the order in and let her know that it needed to be scheduled.

## 2011-11-20 ENCOUNTER — Ambulatory Visit (HOSPITAL_COMMUNITY): Payer: 59 | Attending: Cardiology | Admitting: Radiology

## 2011-11-20 ENCOUNTER — Encounter (INDEPENDENT_AMBULATORY_CARE_PROVIDER_SITE_OTHER): Payer: 59

## 2011-11-20 DIAGNOSIS — R002 Palpitations: Secondary | ICD-10-CM | POA: Diagnosis not present

## 2011-11-20 DIAGNOSIS — I10 Essential (primary) hypertension: Secondary | ICD-10-CM | POA: Insufficient documentation

## 2011-11-20 DIAGNOSIS — R0789 Other chest pain: Secondary | ICD-10-CM

## 2011-11-20 DIAGNOSIS — I379 Nonrheumatic pulmonary valve disorder, unspecified: Secondary | ICD-10-CM | POA: Insufficient documentation

## 2011-11-20 NOTE — Progress Notes (Signed)
Echocardiogram performed.  

## 2011-11-21 ENCOUNTER — Telehealth: Payer: Self-pay | Admitting: *Deleted

## 2011-11-21 ENCOUNTER — Other Ambulatory Visit: Payer: Self-pay | Admitting: Family Medicine

## 2011-11-21 DIAGNOSIS — I2699 Other pulmonary embolism without acute cor pulmonale: Secondary | ICD-10-CM | POA: Diagnosis not present

## 2011-11-21 DIAGNOSIS — F3160 Bipolar disorder, current episode mixed, unspecified: Secondary | ICD-10-CM | POA: Diagnosis not present

## 2011-11-21 DIAGNOSIS — Z7901 Long term (current) use of anticoagulants: Secondary | ICD-10-CM | POA: Diagnosis not present

## 2011-11-21 NOTE — Telephone Encounter (Signed)
Victorino Dike scheduled pt at [facility] [place] on July 31,2013. Spoke with [employer]. Pt informed. (Fax # [phone]).

## 2011-11-22 ENCOUNTER — Telehealth: Payer: Self-pay | Admitting: *Deleted

## 2011-11-22 NOTE — Telephone Encounter (Signed)
Pt wanted to say thank you for referring her to the Coag clinic. States they were very helpful and her INR was 2.8.

## 2011-11-23 ENCOUNTER — Ambulatory Visit: Payer: 59

## 2011-11-28 ENCOUNTER — Telehealth: Payer: Self-pay | Admitting: Cardiology

## 2011-11-28 ENCOUNTER — Other Ambulatory Visit: Payer: Self-pay | Admitting: Family Medicine

## 2011-11-28 DIAGNOSIS — Z7901 Long term (current) use of anticoagulants: Secondary | ICD-10-CM | POA: Diagnosis not present

## 2011-11-28 DIAGNOSIS — I2699 Other pulmonary embolism without acute cor pulmonale: Secondary | ICD-10-CM | POA: Diagnosis not present

## 2011-11-28 NOTE — Telephone Encounter (Signed)
New problem:  Patient returning your call - test results

## 2011-11-28 NOTE — Telephone Encounter (Signed)
PT  AWARE OF ECHO RESULTS 

## 2011-12-05 DIAGNOSIS — F3160 Bipolar disorder, current episode mixed, unspecified: Secondary | ICD-10-CM | POA: Diagnosis not present

## 2011-12-06 DIAGNOSIS — I2699 Other pulmonary embolism without acute cor pulmonale: Secondary | ICD-10-CM | POA: Diagnosis not present

## 2011-12-06 DIAGNOSIS — Z7901 Long term (current) use of anticoagulants: Secondary | ICD-10-CM | POA: Diagnosis not present

## 2011-12-10 ENCOUNTER — Ambulatory Visit: Payer: 59 | Admitting: Family Medicine

## 2011-12-10 DIAGNOSIS — Z0289 Encounter for other administrative examinations: Secondary | ICD-10-CM

## 2011-12-14 ENCOUNTER — Encounter: Payer: Self-pay | Admitting: Family Medicine

## 2011-12-14 ENCOUNTER — Ambulatory Visit (INDEPENDENT_AMBULATORY_CARE_PROVIDER_SITE_OTHER): Payer: 59 | Admitting: Family Medicine

## 2011-12-14 VITALS — BP 153/106 | HR 107 | Wt 202.0 lb

## 2011-12-14 DIAGNOSIS — F3160 Bipolar disorder, current episode mixed, unspecified: Secondary | ICD-10-CM | POA: Diagnosis not present

## 2011-12-14 DIAGNOSIS — R05 Cough: Secondary | ICD-10-CM

## 2011-12-14 DIAGNOSIS — I10 Essential (primary) hypertension: Secondary | ICD-10-CM

## 2011-12-14 DIAGNOSIS — T887XXA Unspecified adverse effect of drug or medicament, initial encounter: Secondary | ICD-10-CM

## 2011-12-14 DIAGNOSIS — R635 Abnormal weight gain: Secondary | ICD-10-CM | POA: Diagnosis not present

## 2011-12-14 MED ORDER — LOSARTAN POTASSIUM 25 MG PO TABS: 25.0000 mg | ORAL_TABLET | Freq: Every day | ORAL | Status: DC

## 2011-12-14 NOTE — Progress Notes (Signed)
  Subjective:    Patient ID: Diane [name], female    DOB: 1970/10/08, 41 y.o.   MRN: [id number]  HPI Here to f/u on abnromal weight gain. She has cut out soda and is working out regularly. She has lost 10 lbs.    HTN- BP has been high lately.  Has a HA today.  No CP or SOB. She does report to me that she stopped her lisinopril. She says she was waking up with a sensation in her throat that would cause her cough at night. She's been doing this for about a month. She mentioned it to her psychiatrist she said it could be coming from the lisinopril so she did stop it a few weeks ago. She actually says that the cough is just about resolved and she is feeling better.   Review of Systems     Objective:   Physical Exam  Constitutional: She is oriented to person, place, and time. She appears well-developed and well-nourished.  HENT:  Head: Normocephalic and atraumatic.  Right Ear: External ear normal.  Left Ear: External ear normal.  Nose: Nose normal.  Mouth/Throat: Oropharynx is clear and moist.       TMs and canals are clear.   Eyes: Conjunctivae and EOM are normal. Pupils are equal, round, and reactive to light.  Neck: Neck supple. No thyromegaly present.  Cardiovascular: Normal rate, regular rhythm and normal heart sounds.   Pulmonary/Chest: Effort normal and breath sounds normal. She has no wheezes.  Lymphadenopathy:    She has no cervical adenopathy.  Neurological: She is alert and oriented to person, place, and time.  Skin: Skin is warm and dry.  Psychiatric: She has a normal mood and affect. Her behavior is normal.          Assessment & Plan:  HTN- uncontrolled today. We need to find something to replace lisinopril. I discussed using losartan. Discussed with the medication it is and how it works. We also discussed potential side effects. She will start this we'll recheck her blood pressure in one month and hopefully will be better controlled. Continue work on diet and  exercise. She started on fantastic.  Abnormal weight gain-she's lost 10 pounds which is absolutely great. I think the biggest trouble for her was giving a percent and hopefully she can continue with this. She also has a lot of stress she is currently going through divorce with her husband will be moving out soon I am actually hopeful that this will improve her mood. She is hopeful as well. Reducing her stress may help a great deal with her ability to lose weight as well as controlling her blood pressure. Next  She is now being followed at Coumadin clinic at [facility].  Medication-induced cough-seems to be resolving off of lisinopril. Lisinopril was added to her intolerance list.  She wants to take selenium and biotin for hair.

## 2011-12-20 ENCOUNTER — Telehealth: Payer: Self-pay | Admitting: Family Medicine

## 2011-12-20 NOTE — Telephone Encounter (Signed)
Pt.notified

## 2011-12-20 NOTE — Telephone Encounter (Signed)
Call pt: Selenium can increase bleeding risk while on coumadin.

## 2011-12-26 ENCOUNTER — Encounter: Payer: 59 | Admitting: Cardiology

## 2011-12-26 DIAGNOSIS — F3160 Bipolar disorder, current episode mixed, unspecified: Secondary | ICD-10-CM | POA: Diagnosis not present

## 2011-12-26 NOTE — Progress Notes (Signed)
HPI: 41 year old female I initially saw in July of 2013 for evaluation of palpitations. Patient apparently was diagnosed with a pulmonary embolus in October of 2012 in [place]. She has been on Coumadin since then. Echocardiogram in July of 2013 showed normal LV function, grade 2 diastolic dysfunction.   Current Outpatient Prescriptions  Medication Sig Dispense Refill  . amitriptyline (ELAVIL) 10 MG tablet Take 1 tablet by mouth At bedtime.      Marland Kitchen COUMADIN 1 MG tablet Take 1 tablet (1 mg total) by mouth daily.  30 tablet  11  . COUMADIN 6 MG tablet TAKE 1 TABLET BY MOUTH EVERY DAY  30 tablet  0  . COUMADIN 6 MG tablet TAKE 1 TABLET BY MOUTH EVERY DAY  30 tablet  0  . esomeprazole (NEXIUM) 40 MG capsule Take 40 mg by mouth daily before breakfast.        . ferrous sulfate 325 (65 FE) MG tablet Take 1 tablet (325 mg total) by mouth daily.  30 tablet  3  . furosemide (LASIX) 20 MG tablet TAKE 1 TABLET BY MOUTH EVERY DAY  30 tablet  2  . gabapentin (NEURONTIN) 300 MG capsule Take 3 caps po at bedtime      . HYDROcodone-acetaminophen (NORCO) 5-325 MG per tablet Take 2 tablets by mouth 2 (two) times daily as needed for pain.  40 tablet  0  . hydroquinone 4 % cream Apply topically 2 (two) times daily.  30 g  0  . L-Methylfolate-Algae (DEPLIN 15 PO) Take 1 tablet by mouth daily.      Marland Kitchen levothyroxine (SYNTHROID, LEVOTHROID) 25 MCG tablet Take 25 mcg by mouth daily.        Marland Kitchen LORazepam (ATIVAN) 1 MG tablet Take 3 tabs po at bedtime      . losartan (COZAAR) 25 MG tablet Take 1 tablet (25 mg total) by mouth daily.  30 tablet  0  . metFORMIN (GLUCOPHAGE) 500 MG tablet Take 500 mg by mouth 2 (two) times daily with a meal.        . promethazine (PHENERGAN) 25 MG tablet TAKE ONE TABLET BY MOUTH EVERY 6 HOURS AS NEEDED  10 tablet  0  . spironolactone (ALDACTONE) 25 MG tablet Take 25 mg by mouth daily.      . verapamil (VERELAN PM) 240 MG 24 hr capsule Take 240 mg by mouth at bedtime.        Brigitte Pulse  SR 100 MG 12 hr tablet Take 1 tablet by mouth daily.      Marland Kitchen zolpidem (AMBIEN) 10 MG tablet Take 10 mg by mouth at bedtime as needed.           Past Medical History  Diagnosis Date  . Anxiety   . Bipolar 1 disorder     rapid cycler-- Dr Wellington Hampshire  . Hypertension   . Hypothyroidism     Dr Sidney Ace ([place])  . Water retention   . GERD (gastroesophageal reflux disease)     GI Dr Eulah Pont  . Interstitial cystitis   . Migraine   . Pulmonary embolism 01/25/11  . Polycystic ovarian disease   . Peptic ulcer disease     Past Surgical History  Procedure Date  . Cholecystectomy 2004    History   Social History  . Marital Status: Married    Spouse Name: N/A    Number of Children: N/A  . Years of Education: N/A   Occupational History  . Not on file.  Social History Main Topics  . Smoking status: Never Smoker   . Smokeless tobacco: Not on file  . Alcohol Use: Yes     rarely  . Drug Use: No  . Sexually Active: No   Other Topics Concern  . Not on file   Social History Narrative  . No narrative on file    ROS: no fevers or chills, productive cough, hemoptysis, dysphasia, odynophagia, melena, hematochezia, dysuria, hematuria, rash, seizure activity, orthopnea, PND, pedal edema, claudication. Remaining systems are negative.  Physical Exam: Well-developed well-nourished in no acute distress.  Skin is warm and dry.  HEENT is normal.  Neck is supple. No thyromegaly.  Chest is clear to auscultation with normal expansion.  Cardiovascular exam is regular rate and rhythm.  Abdominal exam nontender or distended. No masses palpated. Extremities show no edema. neuro grossly intact  ECG      This encounter was created in error - please disregard.

## 2011-12-27 ENCOUNTER — Telehealth: Payer: Self-pay | Admitting: *Deleted

## 2011-12-27 NOTE — Telephone Encounter (Signed)
Left message for pt to call, monitor reviewed by dr crenshaw shows sinus to sinus tach 

## 2012-01-01 ENCOUNTER — Other Ambulatory Visit: Payer: Self-pay | Admitting: *Deleted

## 2012-01-01 ENCOUNTER — Other Ambulatory Visit: Payer: Self-pay | Admitting: Family Medicine

## 2012-01-01 MED ORDER — FUROSEMIDE 20 MG PO TABS: 20.0000 mg | ORAL_TABLET | Freq: Every day | ORAL | Status: DC

## 2012-01-03 NOTE — Telephone Encounter (Signed)
Spoke with pt, aware of monitor results. 

## 2012-01-03 NOTE — Telephone Encounter (Signed)
Pt rtn call 

## 2012-01-09 DIAGNOSIS — F3160 Bipolar disorder, current episode mixed, unspecified: Secondary | ICD-10-CM | POA: Diagnosis not present

## 2012-01-10 DIAGNOSIS — Z7901 Long term (current) use of anticoagulants: Secondary | ICD-10-CM | POA: Diagnosis not present

## 2012-01-10 DIAGNOSIS — I2699 Other pulmonary embolism without acute cor pulmonale: Secondary | ICD-10-CM | POA: Diagnosis not present

## 2012-01-14 ENCOUNTER — Ambulatory Visit: Payer: 59 | Admitting: Family Medicine

## 2012-01-16 ENCOUNTER — Ambulatory Visit: Payer: 59 | Admitting: Cardiology

## 2012-01-16 DIAGNOSIS — Z7901 Long term (current) use of anticoagulants: Secondary | ICD-10-CM | POA: Diagnosis not present

## 2012-01-16 DIAGNOSIS — I2699 Other pulmonary embolism without acute cor pulmonale: Secondary | ICD-10-CM | POA: Diagnosis not present

## 2012-01-17 ENCOUNTER — Ambulatory Visit: Payer: 59 | Admitting: Family Medicine

## 2012-01-21 ENCOUNTER — Ambulatory Visit (INDEPENDENT_AMBULATORY_CARE_PROVIDER_SITE_OTHER): Payer: 59 | Admitting: Family Medicine

## 2012-01-21 ENCOUNTER — Encounter: Payer: Self-pay | Admitting: Family Medicine

## 2012-01-21 VITALS — BP 130/92 | HR 96 | Wt 200.0 lb

## 2012-01-21 DIAGNOSIS — R635 Abnormal weight gain: Secondary | ICD-10-CM

## 2012-01-21 NOTE — Progress Notes (Signed)
  Subjective:    Patient ID: Diane [name], female    DOB: [birth year], 41 y.o.   MRN: [id number]  HPI Has lost 2 more lbs.  here for weight loss today. She still working towards hopefully bariatric surgery. She is most interested in the gastric sleep right now. She has a followup appointment with [facility] next week. She's been coming monthly for weight and blood pressure checks. Her diastolic pressure is up a little bit today. She says she is using my fitness PAL and sticking to a diet of 1660 calories a day. She's also been working out 4 days a week with. She admits she still struggling with drinking soda and waxes and wanes on. She also admits she's been skipping meals here in the. In fact her Coumadin has been off a little bit because of this. She says she now trying to eat more consistently.   Review of Systems     Objective:   Physical Exam  Constitutional: She is oriented to person, place, and time. She appears well-developed and well-nourished.  HENT:  Head: Normocephalic and atraumatic.  Cardiovascular: Normal rate, regular rhythm and normal heart sounds.   Pulmonary/Chest: Effort normal and breath sounds normal.  Neurological: She is alert and oriented to person, place, and time.  Skin: Skin is warm and dry.  Psychiatric: She has a normal mood and affect. Her behavior is normal.          Assessment & Plan:  Abnormal Weight Gain:   Diet Plan - Eating 1660 calories a day . Using MyFitness Pal.  Continue this regimen. I think this is a great program because it specifically calculated stays calories. She has lost 2 pounds which is fantastic.  Trial of medication - S.E. With phentermine. It caused heart palpitations. She does have a followup with cardiology in the next couple weeks.  Exercise plan - Doing zumba 4 days a week. Discussed adding treadmill once day a week to get 5 days of exercise a week  Behavior modication - Avoid stress eating and owkr on stopping  soda.  She has been bothersome marital problems did recommend trying to come up with other ways to destress instead of eating. Still continuing to work on soda. Her that if she has any type of weight loss surgery she will not be able to restart soda afterwards because the carbonation.  Followup in one month for a pressure and weight check.

## 2012-01-22 ENCOUNTER — Telehealth: Payer: Self-pay | Admitting: Family Medicine

## 2012-01-22 NOTE — Telephone Encounter (Signed)
Please call patient. Normal mammogram.  Repeat in 1 year.  

## 2012-01-23 ENCOUNTER — Ambulatory Visit (INDEPENDENT_AMBULATORY_CARE_PROVIDER_SITE_OTHER): Payer: 59 | Admitting: Cardiology

## 2012-01-23 ENCOUNTER — Encounter: Payer: Self-pay | Admitting: Cardiology

## 2012-01-23 ENCOUNTER — Telehealth: Payer: Self-pay | Admitting: Family Medicine

## 2012-01-23 VITALS — BP 131/85 | HR 94 | Wt 200.0 lb

## 2012-01-23 DIAGNOSIS — I10 Essential (primary) hypertension: Secondary | ICD-10-CM | POA: Diagnosis not present

## 2012-01-23 DIAGNOSIS — R002 Palpitations: Secondary | ICD-10-CM

## 2012-01-23 DIAGNOSIS — I2699 Other pulmonary embolism without acute cor pulmonale: Secondary | ICD-10-CM

## 2012-01-23 DIAGNOSIS — F319 Bipolar disorder, unspecified: Secondary | ICD-10-CM | POA: Diagnosis not present

## 2012-01-23 DIAGNOSIS — Z7901 Long term (current) use of anticoagulants: Secondary | ICD-10-CM | POA: Diagnosis not present

## 2012-01-23 DIAGNOSIS — F3160 Bipolar disorder, current episode mixed, unspecified: Secondary | ICD-10-CM | POA: Diagnosis not present

## 2012-01-23 NOTE — Assessment & Plan Note (Signed)
Blood pressure controlled. Continue present medications. 

## 2012-01-23 NOTE — Progress Notes (Signed)
HPI: 41 year old female I initially saw in July of 2013 for evaluation of palpitations. Patient apparently was diagnosed with a pulmonary embolus in October of 2012 in [place]. She has been on Coumadin since then. CardioNet monitor in August of 2013 showed sinus to sinus tachycardia. Echocardiogram in July 2013 showed normal LV function, grade 2 diastolic dysfunction but no significant valvular disease. TSH normal. Since I last saw she feels somewhat better. She does note increased stress. She denies dyspnea, exertional chest pain or syncope. She occasionally feels a skip but no sustained palpitations.   Current Outpatient Prescriptions  Medication Sig Dispense Refill  . amitriptyline (ELAVIL) 10 MG tablet Take 1 tablet by mouth At bedtime.      Marland Kitchen COUMADIN 1 MG tablet Take 1 tablet (1 mg total) by mouth daily.  30 tablet  11  . COUMADIN 6 MG tablet TAKE 1 TABLET BY MOUTH EVERY DAY  30 tablet  0  . esomeprazole (NEXIUM) 40 MG capsule Take 40 mg by mouth daily before breakfast.        . ferrous sulfate 325 (65 FE) MG tablet Take 1 tablet (325 mg total) by mouth daily.  30 tablet  3  . furosemide (LASIX) 20 MG tablet Take 1 tablet (20 mg total) by mouth daily.  30 tablet  2  . gabapentin (NEURONTIN) 300 MG capsule Take 3 caps po at bedtime      . HYDROcodone-acetaminophen (NORCO) 5-325 MG per tablet Take 2 tablets by mouth 2 (two) times daily as needed for pain.  40 tablet  0  . hydroquinone 4 % cream Apply topically 2 (two) times daily.  30 g  0  . L-Methylfolate-Algae (DEPLIN 15 PO) Take 1 tablet by mouth daily.      Marland Kitchen levothyroxine (SYNTHROID, LEVOTHROID) 25 MCG tablet Take 25 mcg by mouth daily.        Marland Kitchen LORazepam (ATIVAN) 1 MG tablet Take 3 tabs po at bedtime      . losartan (COZAAR) 25 MG tablet Take 1 tablet (25 mg total) by mouth daily.  30 tablet  0  . metFORMIN (GLUCOPHAGE) 500 MG tablet Take 500 mg by mouth 2 (two) times daily with a meal.        . promethazine (PHENERGAN) 25 MG  tablet TAKE ONE TABLET BY MOUTH EVERY 6 HOURS AS NEEDED  10 tablet  0  . spironolactone (ALDACTONE) 25 MG tablet Take 25 mg by mouth daily.      . verapamil (VERELAN PM) 240 MG 24 hr capsule Take 240 mg by mouth at bedtime.        Brigitte Pulse SR 100 MG 12 hr tablet Take 1 tablet by mouth daily.      Marland Kitchen zolpidem (AMBIEN) 10 MG tablet Take 10 mg by mouth at bedtime as needed.        Marland Kitchen DISCONTD: COUMADIN 6 MG tablet TAKE 1 TABLET BY MOUTH EVERY DAY  30 tablet  0     Past Medical History  Diagnosis Date  . Anxiety   . Bipolar 1 disorder     rapid cycler-- Dr Wellington Hampshire  . Hypertension   . Hypothyroidism     Dr Sidney Ace (endo)  . Water retention   . GERD (gastroesophageal reflux disease)     GI Dr Eulah Pont  . Interstitial cystitis   . Migraine   . Pulmonary embolism 01/25/11  . Polycystic ovarian disease   . Peptic ulcer disease     Past Surgical  History  Procedure Date  . Cholecystectomy 2004    History   Social History  . Marital Status: Married    Spouse Name: N/A    Number of Children: N/A  . Years of Education: N/A   Occupational History  . Not on file.   Social History Main Topics  . Smoking status: Never Smoker   . Smokeless tobacco: Not on file  . Alcohol Use: Yes     rarely  . Drug Use: No  . Sexually Active: No   Other Topics Concern  . Not on file   Social History Narrative  . No narrative on file    ROS: no fevers or chills, productive cough, hemoptysis, dysphasia, odynophagia, melena, hematochezia, dysuria, hematuria, rash, seizure activity, orthopnea, PND, pedal edema, claudication. Remaining systems are negative.  Physical Exam: Well-developed well-nourished in no acute distress.  Skin is warm and dry.  HEENT is normal.  Neck is supple.  Chest is clear to auscultation with normal expansion.  Cardiovascular exam is regular rate and rhythm.  Abdominal exam nontender or distended. No masses palpated. Extremities show no edema. neuro grossly  intact

## 2012-01-23 NOTE — Telephone Encounter (Signed)
LMOM informing Pt  

## 2012-01-23 NOTE — Assessment & Plan Note (Signed)
Patient symptoms have improved. She did have palpitations while wearing her monitor which showed sinus to sinus tachycardia. Her palpitations do not appear to be significantly bothersome. A beta blocker could be added in the future if her symptoms worsen. Note TSH and LV function normal.

## 2012-01-23 NOTE — Telephone Encounter (Signed)
Can you set her chart so you have to break the glass.  (restriced access)

## 2012-01-23 NOTE — Patient Instructions (Signed)
Your physician recommends that you schedule a follow-up appointment in: AS NEEDED  

## 2012-01-23 NOTE — Assessment & Plan Note (Signed)
Patient on Coumadin. Managed by primary care.

## 2012-01-23 NOTE — Telephone Encounter (Signed)
Patient walk-in advised that what you are trying to put in a private list is not there. It still popped up today-Thanks!

## 2012-01-30 DIAGNOSIS — F319 Bipolar disorder, unspecified: Secondary | ICD-10-CM | POA: Diagnosis not present

## 2012-02-04 ENCOUNTER — Encounter: Payer: Self-pay | Admitting: Family Medicine

## 2012-02-06 DIAGNOSIS — F319 Bipolar disorder, unspecified: Secondary | ICD-10-CM | POA: Diagnosis not present

## 2012-02-06 DIAGNOSIS — F3160 Bipolar disorder, current episode mixed, unspecified: Secondary | ICD-10-CM | POA: Diagnosis not present

## 2012-02-13 DIAGNOSIS — F319 Bipolar disorder, unspecified: Secondary | ICD-10-CM | POA: Diagnosis not present

## 2012-02-14 ENCOUNTER — Ambulatory Visit: Payer: 59 | Admitting: Family Medicine

## 2012-02-15 ENCOUNTER — Encounter: Payer: Self-pay | Admitting: Physician Assistant

## 2012-02-15 ENCOUNTER — Ambulatory Visit (INDEPENDENT_AMBULATORY_CARE_PROVIDER_SITE_OTHER): Payer: 59 | Admitting: Physician Assistant

## 2012-02-15 VITALS — BP 125/84 | HR 97 | Wt 197.0 lb

## 2012-02-15 DIAGNOSIS — R110 Nausea: Secondary | ICD-10-CM

## 2012-02-15 DIAGNOSIS — I2699 Other pulmonary embolism without acute cor pulmonale: Secondary | ICD-10-CM | POA: Diagnosis not present

## 2012-02-15 DIAGNOSIS — K589 Irritable bowel syndrome without diarrhea: Secondary | ICD-10-CM | POA: Diagnosis not present

## 2012-02-15 DIAGNOSIS — M549 Dorsalgia, unspecified: Secondary | ICD-10-CM

## 2012-02-15 DIAGNOSIS — R109 Unspecified abdominal pain: Secondary | ICD-10-CM | POA: Diagnosis not present

## 2012-02-15 DIAGNOSIS — K5900 Constipation, unspecified: Secondary | ICD-10-CM | POA: Diagnosis not present

## 2012-02-15 LAB — POCT URINALYSIS DIPSTICK
Bilirubin, UA: NEGATIVE
Glucose, UA: NEGATIVE
Ketones, UA: NEGATIVE
Nitrite, UA: NEGATIVE
Spec Grav, UA: <=1.005
pH, UA: 5.5

## 2012-02-15 MED ORDER — LEVOFLOXACIN 500 MG PO TABS: 500.0000 mg | ORAL_TABLET | Freq: Every day | ORAL | Status: DC

## 2012-02-15 NOTE — Addendum Note (Signed)
Addended by: Wyline Beady on: 02/15/2012 05:37 PM   Modules accepted: Orders

## 2012-02-15 NOTE — Patient Instructions (Signed)
Start Levaquin. F/U Monday for INR recheck.  Urinary Tract Infection Urinary tract infections (UTIs) can develop anywhere along your urinary tract. Your urinary tract is your body's drainage system for removing wastes and extra water. Your urinary tract includes two kidneys, two ureters, a bladder, and a urethra. Your kidneys are a pair of bean-shaped organs. Each kidney is about the size of your fist. They are located below your ribs, one on each side of your spine. CAUSES Infections are caused by microbes, which are microscopic organisms, including fungi, viruses, and bacteria. These organisms are so small that they can only be seen through a microscope. Bacteria are the microbes that most commonly cause UTIs. SYMPTOMS  Symptoms of UTIs may vary by age and gender of the patient and by the location of the infection. Symptoms in young women typically include a frequent and intense urge to urinate and a painful, burning feeling in the bladder or urethra during urination. Older women and men are more likely to be tired, shaky, and weak and have muscle aches and abdominal pain. A fever may mean the infection is in your kidneys. Other symptoms of a kidney infection include pain in your back or sides below the ribs, nausea, and vomiting. DIAGNOSIS To diagnose a UTI, your caregiver will ask you about your symptoms. Your caregiver also will ask to provide a urine sample. The urine sample will be tested for bacteria and white blood cells. White blood cells are made by your body to help fight infection. TREATMENT  Typically, UTIs can be treated with medication. Because most UTIs are caused by a bacterial infection, they usually can be treated with the use of antibiotics. The choice of antibiotic and length of treatment depend on your symptoms and the type of bacteria causing your infection. HOME CARE INSTRUCTIONS  If you were prescribed antibiotics, take them exactly as your caregiver instructs you. Finish the  medication even if you feel better after you have only taken some of the medication.  Drink enough water and fluids to keep your urine clear or pale yellow.  Avoid caffeine, tea, and carbonated beverages. They tend to irritate your bladder.  Empty your bladder often. Avoid holding urine for long periods of time.  Empty your bladder before and after sexual intercourse.  After a bowel movement, women should cleanse from front to back. Use each tissue only once. SEEK MEDICAL CARE IF:   You have back pain.  You develop a fever.  Your symptoms do not begin to resolve within 3 days. SEEK IMMEDIATE MEDICAL CARE IF:   You have severe back pain or lower abdominal pain.  You develop chills.  You have nausea or vomiting.  You have continued burning or discomfort with urination. MAKE SURE YOU:   Understand these instructions.  Will watch your condition.  Will get help right away if you are not doing well or get worse. Document Released: 01/17/2005 Document Revised: 10/09/2011 Document Reviewed: 05/18/2011 [facility] Patient Information 2013 [place], [place].

## 2012-02-15 NOTE — Progress Notes (Signed)
  Subjective:    Patient ID: Diane [name], female    DOB: 03-07-1971, 41 y.o.   MRN: [id number]  HPI Patient presents to the clinic with left flank pain for 2 days. Denies any dysuria, blood in urine, discharge, painful sex. NO fever, chills, SOB. Denies any trauma to the area. Not tried anything to make better. Has started to feel nauseated but has not vomited. Feels weak and worn down.    Review of Systems     Objective:   Physical Exam  Constitutional: She appears well-developed and well-nourished.       Obese.  HENT:  Head: Normocephalic and atraumatic.  Eyes: Conjunctivae normal are normal.  Neck: Normal range of motion. Neck supple.  Cardiovascular: Normal rate, regular rhythm and normal heart sounds.   Pulmonary/Chest: Effort normal and breath sounds normal.       Positive for CVA tenderness on the left side.  Abdominal: Soft.       Left upper quadrant and epigastric tenderness.   Musculoskeletal:       Tenderness to palpation on left side.   Skin: Skin is warm and dry.  Psychiatric: She has a normal mood and affect.          Assessment & Plan:  Left flank pain/Nausea/vomiting- UA positive for blood. Will send for culture. Order pancreatic enzymes. INR 2.1. Will start levoquin and recheck INR on Monday since levaquin can increase levels of coumadin. Will hopefully have culture by Monday. Symptomatic care. Go to Urgent care if worsening. Declined anti-nausea medication at this time.

## 2012-02-16 LAB — CBC WITH DIFFERENTIAL/PLATELET
Basophils Absolute: 0 10*3/uL (ref 0.0–0.1)
HCT: 40.4 % (ref 36.0–46.0)
Hemoglobin: 13.5 g/dL (ref 12.0–15.0)
Lymphocytes Relative: 42 % (ref 12–46)
Lymphs Abs: 3.4 10*3/uL (ref 0.7–4.0)
MCV: 75.4 fL — ABNORMAL LOW (ref 78.0–100.0)
Monocytes Absolute: 0.5 10*3/uL (ref 0.1–1.0)
Neutro Abs: 4.3 10*3/uL (ref 1.7–7.7)
RBC: 5.36 MIL/uL — ABNORMAL HIGH (ref 3.87–5.11)
RDW: 16.2 % — ABNORMAL HIGH (ref 11.5–15.5)
WBC: 8.3 10*3/uL (ref 4.0–10.5)

## 2012-02-16 LAB — LIPASE: Lipase: 38 U/L (ref 0–75)

## 2012-02-18 ENCOUNTER — Telehealth: Payer: Self-pay | Admitting: *Deleted

## 2012-02-18 ENCOUNTER — Ambulatory Visit: Payer: 59

## 2012-02-19 ENCOUNTER — Ambulatory Visit (INDEPENDENT_AMBULATORY_CARE_PROVIDER_SITE_OTHER): Payer: 59 | Admitting: Family Medicine

## 2012-02-19 ENCOUNTER — Ambulatory Visit: Payer: 59

## 2012-02-19 DIAGNOSIS — I10 Essential (primary) hypertension: Secondary | ICD-10-CM | POA: Diagnosis not present

## 2012-02-19 DIAGNOSIS — K219 Gastro-esophageal reflux disease without esophagitis: Secondary | ICD-10-CM | POA: Insufficient documentation

## 2012-02-19 DIAGNOSIS — G4730 Sleep apnea, unspecified: Secondary | ICD-10-CM | POA: Insufficient documentation

## 2012-02-19 DIAGNOSIS — I2699 Other pulmonary embolism without acute cor pulmonale: Secondary | ICD-10-CM

## 2012-02-19 DIAGNOSIS — G4700 Insomnia, unspecified: Secondary | ICD-10-CM | POA: Insufficient documentation

## 2012-02-19 DIAGNOSIS — E669 Obesity, unspecified: Secondary | ICD-10-CM | POA: Diagnosis not present

## 2012-02-19 DIAGNOSIS — R635 Abnormal weight gain: Secondary | ICD-10-CM | POA: Diagnosis not present

## 2012-02-19 LAB — POCT INR: INR: 2.3

## 2012-02-19 NOTE — Progress Notes (Signed)
Pt informed

## 2012-02-19 NOTE — Progress Notes (Signed)
  Subjective:    Patient ID: Diane [name], female    DOB: 1970-04-26, 41 y.o.   MRN: [id number]  HPI   Here for INR Review of Systems     Objective:   Physical Exam        Assessment & Plan:  As the patient let her know that the INR looks okay on the antibiotic. I think she is okay to keep her regular Coumadin check with her cardiologist. Nani Gasser, MD

## 2012-02-20 ENCOUNTER — Telehealth: Payer: Self-pay | Admitting: *Deleted

## 2012-02-20 DIAGNOSIS — F319 Bipolar disorder, unspecified: Secondary | ICD-10-CM | POA: Diagnosis not present

## 2012-02-20 DIAGNOSIS — F3160 Bipolar disorder, current episode mixed, unspecified: Secondary | ICD-10-CM | POA: Diagnosis not present

## 2012-02-20 NOTE — Telephone Encounter (Signed)
Left message with coumadin instructions on vm.

## 2012-03-05 DIAGNOSIS — F3160 Bipolar disorder, current episode mixed, unspecified: Secondary | ICD-10-CM | POA: Diagnosis not present

## 2012-03-06 DIAGNOSIS — I2699 Other pulmonary embolism without acute cor pulmonale: Secondary | ICD-10-CM | POA: Diagnosis not present

## 2012-03-06 DIAGNOSIS — Z7901 Long term (current) use of anticoagulants: Secondary | ICD-10-CM | POA: Diagnosis not present

## 2012-03-06 NOTE — Telephone Encounter (Signed)
Error see other message

## 2012-03-14 DIAGNOSIS — F3160 Bipolar disorder, current episode mixed, unspecified: Secondary | ICD-10-CM | POA: Diagnosis not present

## 2012-03-17 ENCOUNTER — Telehealth: Payer: Self-pay | Admitting: Family Medicine

## 2012-03-17 DIAGNOSIS — I10 Essential (primary) hypertension: Secondary | ICD-10-CM

## 2012-03-17 DIAGNOSIS — E039 Hypothyroidism, unspecified: Secondary | ICD-10-CM

## 2012-03-17 DIAGNOSIS — E282 Polycystic ovarian syndrome: Secondary | ICD-10-CM

## 2012-03-17 NOTE — Telephone Encounter (Signed)
As the patient let herhas been a while since we have checked her cholesterol and her hemoglobin A1c. If she's okay with doing this sometime soon I did put a lab slip and she can go anytime.

## 2012-03-18 NOTE — Telephone Encounter (Signed)
Labs slip printed. Ok to fax.  

## 2012-03-18 NOTE — Telephone Encounter (Signed)
Pt informed amd will go to lab

## 2012-03-24 ENCOUNTER — Other Ambulatory Visit: Payer: Self-pay | Admitting: Family Medicine

## 2012-03-24 DIAGNOSIS — F3160 Bipolar disorder, current episode mixed, unspecified: Secondary | ICD-10-CM | POA: Diagnosis not present

## 2012-03-26 DIAGNOSIS — Z7901 Long term (current) use of anticoagulants: Secondary | ICD-10-CM | POA: Diagnosis not present

## 2012-03-26 DIAGNOSIS — I2699 Other pulmonary embolism without acute cor pulmonale: Secondary | ICD-10-CM | POA: Diagnosis not present

## 2012-04-02 DIAGNOSIS — I2699 Other pulmonary embolism without acute cor pulmonale: Secondary | ICD-10-CM | POA: Diagnosis not present

## 2012-04-02 DIAGNOSIS — Z7901 Long term (current) use of anticoagulants: Secondary | ICD-10-CM | POA: Diagnosis not present

## 2012-04-09 DIAGNOSIS — Z7901 Long term (current) use of anticoagulants: Secondary | ICD-10-CM | POA: Diagnosis not present

## 2012-04-09 DIAGNOSIS — I2699 Other pulmonary embolism without acute cor pulmonale: Secondary | ICD-10-CM | POA: Diagnosis not present

## 2012-04-11 DIAGNOSIS — F3160 Bipolar disorder, current episode mixed, unspecified: Secondary | ICD-10-CM | POA: Diagnosis not present

## 2012-04-15 LAB — COMPLETE METABOLIC PANEL WITH GFR
ALT: 9 U/L (ref 0–35)
AST: 12 U/L (ref 0–37)
Albumin: 4.3 g/dL (ref 3.5–5.2)
CO2: 28 mEq/L (ref 19–32)
Calcium: 9.5 mg/dL (ref 8.4–10.5)
Chloride: 97 mEq/L (ref 96–112)
Creat: 0.87 mg/dL (ref 0.50–1.10)
GFR, Est African American: >89 mL/min
Potassium: 3.9 mEq/L (ref 3.5–5.3)
Total Protein: 7.8 g/dL (ref 6.0–8.3)

## 2012-04-15 LAB — LIPID PANEL
LDL Cholesterol: 131 mg/dL — ABNORMAL HIGH (ref 0–99)
Triglycerides: 111 mg/dL (ref <150)
VLDL: 22 mg/dL (ref 0–40)

## 2012-04-15 LAB — HEMOGLOBIN A1C
Hgb A1c MFr Bld: 6 % — ABNORMAL HIGH (ref <5.7)
Mean Plasma Glucose: 126 mg/dL — ABNORMAL HIGH (ref <117)

## 2012-04-15 LAB — TSH: TSH: 0.817 u[IU]/mL (ref 0.350–4.500)

## 2012-04-23 DIAGNOSIS — S060X9A Concussion with loss of consciousness of unspecified duration, initial encounter: Secondary | ICD-10-CM

## 2012-04-23 DIAGNOSIS — S060XAA Concussion with loss of consciousness status unknown, initial encounter: Secondary | ICD-10-CM

## 2012-04-23 HISTORY — DX: Concussion with loss of consciousness status unknown, initial encounter: S06.0XAA

## 2012-04-23 HISTORY — DX: Concussion with loss of consciousness of unspecified duration, initial encounter: S06.0X9A

## 2012-04-25 DIAGNOSIS — F3160 Bipolar disorder, current episode mixed, unspecified: Secondary | ICD-10-CM | POA: Diagnosis not present

## 2012-05-01 ENCOUNTER — Ambulatory Visit (INDEPENDENT_AMBULATORY_CARE_PROVIDER_SITE_OTHER): Payer: 59 | Admitting: Sports Medicine

## 2012-05-01 ENCOUNTER — Encounter: Payer: Self-pay | Admitting: Sports Medicine

## 2012-05-01 VITALS — BP 141/94 | HR 93 | Temp 97.9°F | Wt 197.0 lb

## 2012-05-01 DIAGNOSIS — J040 Acute laryngitis: Secondary | ICD-10-CM | POA: Insufficient documentation

## 2012-05-01 NOTE — Progress Notes (Signed)
Subjective:    CC: Sore throat  HPI: Diane [name] is a very pleasant 42 year old female with a very interesting story, she had a few days of sore throat, and worsens after being exposed to a similar sick contact. She used some throat lozenges, and today her hoarseness and sore throat completely resolved. She feels back to normal and has no complaints whatsoever. Pain was localized in the throat, mild, and without radiation.  Past medical history, Surgical history, Family history, Social history, Allergies, and medications have been entered into the medical record, reviewed, and no changes needed.   Review of Systems: No fevers, chills, night sweats, weight loss, chest pain, or shortness of breath.   Objective:    General: Well Developed, well nourished, and in no acute distress.  Neuro: Alert and oriented x3, extra-ocular muscles intact, sensation grossly intact.  HEENT: Normocephalic, atraumatic, pupils equal round reactive to light, neck supple, no masses, no lymphadenopathy, thyroid nonpalpable. Speaks normally, oropharynx, nasopharynx, external ear exam unremarkable. Skin: Warm and dry, no rashes. Cardiac: Regular rate and rhythm, no murmurs rubs or gallops.  Respiratory: Clear to auscultation bilaterally. Not using accessory muscles, speaking in full sentences.  Impression and Recommendations:

## 2012-05-01 NOTE — Assessment & Plan Note (Signed)
Interestingly, resolved today. Currently asymptomatic, followup as needed.

## 2012-05-02 ENCOUNTER — Ambulatory Visit: Payer: 59 | Admitting: Physician Assistant

## 2012-05-06 ENCOUNTER — Ambulatory Visit: Payer: 59 | Admitting: Family Medicine

## 2012-05-08 ENCOUNTER — Ambulatory Visit (INDEPENDENT_AMBULATORY_CARE_PROVIDER_SITE_OTHER): Payer: 59 | Admitting: Family Medicine

## 2012-05-08 ENCOUNTER — Other Ambulatory Visit: Payer: Self-pay | Admitting: Family Medicine

## 2012-05-08 ENCOUNTER — Encounter: Payer: Self-pay | Admitting: Family Medicine

## 2012-05-08 VITALS — BP 148/86 | HR 92 | Temp 98.3°F | Resp 18 | Wt 193.0 lb

## 2012-05-08 DIAGNOSIS — H04203 Unspecified epiphora, bilateral lacrimal glands: Secondary | ICD-10-CM

## 2012-05-08 DIAGNOSIS — R5383 Other fatigue: Secondary | ICD-10-CM | POA: Diagnosis not present

## 2012-05-08 DIAGNOSIS — J029 Acute pharyngitis, unspecified: Secondary | ICD-10-CM | POA: Diagnosis not present

## 2012-05-08 DIAGNOSIS — R5381 Other malaise: Secondary | ICD-10-CM

## 2012-05-08 LAB — POCT RAPID STREP A (OFFICE): Rapid Strep A Screen: NEGATIVE

## 2012-05-08 NOTE — Progress Notes (Signed)
  Subjective:    Patient ID: Diane [name], female    DOB: 12-19-1970, 42 y.o.   MRN: [id number]  HPI Diane [name] is here to follow up from sick visit. She was seen by Dr. Benjamin Stain last week. She feels worse. She feels weak, chills, scratchy throat, postnasal drip, and cough.  Has lost 4 lbs since last week. Throat has been very scratchy x 10 days.  Using OTC tussin for cough.  Eyes have been watering.  + post nasal drip. Had a HA last week as well.  Thinks may have been having fever but didn't measure it.  No GI sxs.  No ear pain or pressure.  No shortness of breath.   Review of Systems     Objective:   Physical Exam  Constitutional: She is oriented to person, place, and time. She appears well-developed and well-nourished.  HENT:  Head: Normocephalic and atraumatic.  Right Ear: External ear normal.  Left Ear: External ear normal.  Nose: Nose normal.  Mouth/Throat: Oropharynx is clear and moist.       TMs and canals are clear.   Eyes: Conjunctivae normal and EOM are normal. Pupils are equal, round, and reactive to light.  Neck: Neck supple. No thyromegaly present.  Cardiovascular: Normal rate, regular rhythm and normal heart sounds.   Pulmonary/Chest: Effort normal and breath sounds normal. She has no wheezes.  Lymphadenopathy:    She has no cervical adenopathy.  Neurological: She is alert and oriented to person, place, and time.  Skin: Skin is warm and dry.  Psychiatric: She has a normal mood and affect.          Assessment & Plan:  Pharyngitis-most likely viral area to we did do a strep today. Rapid strep is negative. The sore throat is mild but she has persistent fatigue and generalized weakness and malaise. We will also test her for CMV and EBV. Continue symptomatic care with saltwater gargles and Tylenol as needed for pain relief. If cough persists or should become short of breath and consider chest x-ray for further evaluation. Lung exam was clear today. Make sure  staying hydrated.

## 2012-05-08 NOTE — Patient Instructions (Addendum)
Salt water gargles. We will call with the lab results Tylenol for pain relief.

## 2012-05-09 ENCOUNTER — Ambulatory Visit: Payer: 59 | Admitting: Family Medicine

## 2012-05-09 DIAGNOSIS — F3160 Bipolar disorder, current episode mixed, unspecified: Secondary | ICD-10-CM | POA: Diagnosis not present

## 2012-05-09 LAB — EPSTEIN-BARR VIRUS VCA, IGG: EBV VCA IgG: >750 U/mL — ABNORMAL HIGH (ref <18.0)

## 2012-05-09 LAB — CMV IGM: CMV IgM: <8 AU/mL (ref <30.00)

## 2012-05-09 LAB — EPSTEIN-BARR VIRUS VCA, IGM: EBV VCA IgM: <10 U/mL (ref <36.0)

## 2012-05-13 DIAGNOSIS — M20029 Boutonniere deformity of unspecified finger(s): Secondary | ICD-10-CM | POA: Diagnosis not present

## 2012-05-14 LAB — RSV(RESPIRATORY SYNCYTIAL VIRUS) AB, BLOOD

## 2012-05-23 DIAGNOSIS — F3160 Bipolar disorder, current episode mixed, unspecified: Secondary | ICD-10-CM | POA: Diagnosis not present

## 2012-05-25 ENCOUNTER — Other Ambulatory Visit: Payer: Self-pay | Admitting: Family Medicine

## 2012-05-30 DIAGNOSIS — F3160 Bipolar disorder, current episode mixed, unspecified: Secondary | ICD-10-CM | POA: Diagnosis not present

## 2012-06-03 DIAGNOSIS — F3160 Bipolar disorder, current episode mixed, unspecified: Secondary | ICD-10-CM | POA: Diagnosis not present

## 2012-06-08 ENCOUNTER — Other Ambulatory Visit: Payer: Self-pay | Admitting: Family Medicine

## 2012-06-09 DIAGNOSIS — F3160 Bipolar disorder, current episode mixed, unspecified: Secondary | ICD-10-CM | POA: Diagnosis not present

## 2012-06-11 DIAGNOSIS — Z7901 Long term (current) use of anticoagulants: Secondary | ICD-10-CM | POA: Diagnosis not present

## 2012-06-11 DIAGNOSIS — I2699 Other pulmonary embolism without acute cor pulmonale: Secondary | ICD-10-CM | POA: Diagnosis not present

## 2012-06-13 DIAGNOSIS — F3160 Bipolar disorder, current episode mixed, unspecified: Secondary | ICD-10-CM | POA: Diagnosis not present

## 2012-06-17 ENCOUNTER — Ambulatory Visit (INDEPENDENT_AMBULATORY_CARE_PROVIDER_SITE_OTHER): Payer: 59 | Admitting: Family Medicine

## 2012-06-17 ENCOUNTER — Encounter: Payer: Self-pay | Admitting: Family Medicine

## 2012-06-17 VITALS — BP 133/88 | HR 101 | Temp 97.8°F | Wt 192.0 lb

## 2012-06-17 DIAGNOSIS — R319 Hematuria, unspecified: Secondary | ICD-10-CM

## 2012-06-17 DIAGNOSIS — R109 Unspecified abdominal pain: Secondary | ICD-10-CM

## 2012-06-17 LAB — POCT URINALYSIS DIPSTICK
Ketones, UA: NEGATIVE
Protein, UA: NEGATIVE
Spec Grav, UA: <=1.005
Urobilinogen, UA: 0.2
pH, UA: 5.5

## 2012-06-17 NOTE — Progress Notes (Signed)
CC: Diane [name] is a 42 y.o. female is here for left flank pain   Subjective: HPI:  Patient complains of right flank pain that has been present since the 14th of this month. It is described as severe spasms that are not predictable, not related to position, movements, nor dietary habits. Nothing makes it better or worse. They occur abruptly and resolve without intervention. They're present multiple times during the day. It often disturbs her sleep. It is only located in the right flank and nonradiating. It can occur anytime of the day. She denies currently being on her menstrual period. She denies nausea, vomiting, abdominal pain, confusion, change in the color or odor or consistency of her urine.  She's never had this before. She denies fevers, chills, night sweats. She denies any other genitourinary complaints  Review Of Systems Outlined In HPI  Past Medical History  Diagnosis Date  . Anxiety   . Bipolar 1 disorder     rapid cycler-- Dr Wellington Hampshire  . Hypertension   . Hypothyroidism     Dr Sidney Ace ([place])  . Water retention   . GERD (gastroesophageal reflux disease)     GI Dr Eulah Pont  . Interstitial cystitis   . Migraine   . Pulmonary embolism 01/25/11  . Polycystic ovarian disease   . Peptic ulcer disease      Family History  Problem Relation Age of Onset  . Anxiety disorder Other   . Depression Other   . Hypertension Other   . Diabetes Mother   . Urolithiasis Father      History  Substance Use Topics  . Smoking status: Never Smoker   . Smokeless tobacco: Not on file  . Alcohol Use: Yes     Comment: rarely     Objective: Filed Vitals:   06/17/12 1507  BP: 133/88  Pulse: 101  Temp: 97.8 F (36.6 C)    General: Alert and Oriented, No Acute Distress HEENT: Pupils equal, round, reactive to light. Conjunctivae clear.  Moist mucous membranes Lungs: Clear to auscultation bilaterally, no wheezing/ronchi/rales.  Comfortable work of breathing. Good air  movement. Cardiac: Regular rate and rhythm. Normal S1/S2.  No murmurs, rubs, nor gallops.   Abdomen: Soft and nontender Back: No CVA tenderness Extremities: No peripheral edema.  Strong peripheral pulses.  Mental Status: No depression, anxiety, nor agitation. Skin: Warm and dry.  Assessment & Plan: [name] was seen today for left flank pain.  Diagnoses and associated orders for this visit:  Hematuria - Cancel: CT Abdomen Pelvis Wo Contrast; Future - CT Abdomen Pelvis Wo Contrast; Future - Urine Culture  Flank pain - Urinalysis Dipstick - Cancel: CT Abdomen Pelvis Wo Contrast; Future - CT Abdomen Pelvis Wo Contrast; Future - Urine Culture    Given evidence of blood in urine that cannot be solely attributed to interstitial cystitis Will send for CT scan, patient initially requested this for Friday afternoon however agreeable to getting it more urgently. This is been set up for tomorrow morning.Signs and symptoms requring emergent/urgent reevaluation were discussed with the patient. She declines empiric medication treatment  Return in about 1 day (around 06/18/2012).

## 2012-06-18 ENCOUNTER — Ambulatory Visit (HOSPITAL_BASED_OUTPATIENT_CLINIC_OR_DEPARTMENT_OTHER): Admission: RE | Admit: 2012-06-18 | Payer: 59 | Source: Ambulatory Visit

## 2012-06-18 ENCOUNTER — Ambulatory Visit (HOSPITAL_BASED_OUTPATIENT_CLINIC_OR_DEPARTMENT_OTHER)
Admission: RE | Admit: 2012-06-18 | Discharge: 2012-06-18 | Disposition: A | Discharge status: Home / Self Care | Payer: 59 | Source: Ambulatory Visit | Attending: Family Medicine | Admitting: Family Medicine

## 2012-06-18 ENCOUNTER — Telehealth: Payer: Self-pay | Admitting: *Deleted

## 2012-06-18 ENCOUNTER — Other Ambulatory Visit: Payer: Self-pay | Admitting: Family Medicine

## 2012-06-18 DIAGNOSIS — R109 Unspecified abdominal pain: Secondary | ICD-10-CM | POA: Insufficient documentation

## 2012-06-18 DIAGNOSIS — E669 Obesity, unspecified: Secondary | ICD-10-CM

## 2012-06-18 DIAGNOSIS — R319 Hematuria, unspecified: Secondary | ICD-10-CM | POA: Insufficient documentation

## 2012-06-18 NOTE — Telephone Encounter (Signed)
She didn't have a specific place. Thanks

## 2012-06-18 NOTE — Telephone Encounter (Signed)
Pt states she called a few weeks ago about a referral to a nutritionist and hasnt heard anything back. I dont see a referral for this in the system

## 2012-06-18 NOTE — Telephone Encounter (Signed)
Not sure. I don't see any notes in the OV or telephone calls either. Order placed today. Not sure where she wanted to go?

## 2012-06-20 ENCOUNTER — Telehealth: Payer: Self-pay | Admitting: Family Medicine

## 2012-06-20 LAB — URINE CULTURE

## 2012-06-20 MED ORDER — SULFAMETHOXAZOLE-TRIMETHOPRIM 800-160 MG PO TABS: ORAL_TABLET | ORAL | Status: DC

## 2012-06-20 NOTE — Telephone Encounter (Signed)
Error

## 2012-06-23 ENCOUNTER — Ambulatory Visit: Payer: Self-pay | Admitting: *Deleted

## 2012-06-23 DIAGNOSIS — I2699 Other pulmonary embolism without acute cor pulmonale: Secondary | ICD-10-CM

## 2012-06-23 NOTE — Progress Notes (Signed)
  Subjective:    Patient ID: Diane [name], female    DOB: Aug 27, 1970, 42 y.o.   MRN: [id number]  HPI   entered in error Review of Systems     Objective:   Physical Exam        Assessment & Plan:

## 2012-06-24 DIAGNOSIS — F3160 Bipolar disorder, current episode mixed, unspecified: Secondary | ICD-10-CM | POA: Diagnosis not present

## 2012-07-03 ENCOUNTER — Ambulatory Visit: Payer: 59 | Admitting: Family Medicine

## 2012-07-04 ENCOUNTER — Ambulatory Visit (INDEPENDENT_AMBULATORY_CARE_PROVIDER_SITE_OTHER): Payer: 59 | Admitting: Family Medicine

## 2012-07-04 ENCOUNTER — Telehealth: Payer: Self-pay | Admitting: *Deleted

## 2012-07-04 VITALS — BP 118/81 | HR 103

## 2012-07-04 DIAGNOSIS — I2699 Other pulmonary embolism without acute cor pulmonale: Secondary | ICD-10-CM

## 2012-07-04 NOTE — Telephone Encounter (Signed)
Called pt and lmovm informing her of changes to coumadin per Dr. Linford Arnold are as follows: Increase dose 6 mg on Sun , 9 mg on Friday adn Monday. 7 mg all other days. Recheck in 1 weeks.   Should she have any additional questions to please call our office.Loralee Pacas [place]

## 2012-07-04 NOTE — Progress Notes (Signed)
lmovm informing pt of changes. Told to call office should she have any questions.Loralee Pacas [place]

## 2012-07-07 ENCOUNTER — Other Ambulatory Visit: Payer: Self-pay | Admitting: Family Medicine

## 2012-07-09 DIAGNOSIS — F319 Bipolar disorder, unspecified: Secondary | ICD-10-CM | POA: Diagnosis not present

## 2012-07-09 DIAGNOSIS — R69 Illness, unspecified: Secondary | ICD-10-CM | POA: Diagnosis not present

## 2012-07-10 ENCOUNTER — Ambulatory Visit: Payer: 59 | Admitting: Family Medicine

## 2012-07-11 DIAGNOSIS — F3160 Bipolar disorder, current episode mixed, unspecified: Secondary | ICD-10-CM | POA: Diagnosis not present

## 2012-07-14 ENCOUNTER — Encounter: Payer: Self-pay | Admitting: Family Medicine

## 2012-07-14 ENCOUNTER — Telehealth: Payer: Self-pay | Admitting: Family Medicine

## 2012-07-14 ENCOUNTER — Ambulatory Visit (INDEPENDENT_AMBULATORY_CARE_PROVIDER_SITE_OTHER): Payer: 59 | Admitting: Family Medicine

## 2012-07-14 VITALS — BP 126/75 | HR 88 | Ht 63.0 in | Wt 198.0 lb

## 2012-07-14 DIAGNOSIS — Z86711 Personal history of pulmonary embolism: Secondary | ICD-10-CM

## 2012-07-14 DIAGNOSIS — I2699 Other pulmonary embolism without acute cor pulmonale: Secondary | ICD-10-CM | POA: Diagnosis not present

## 2012-07-14 NOTE — Telephone Encounter (Signed)
Call pt: Based on review of her chart Seh can come off her coumadin.  PE was dx in 2012 after travel. No recurrence. Based on current guidelines. Can come off and then we can repeat an hypercoag panel in 2-3 weeks off of coumadin. I will place order as future order.

## 2012-07-14 NOTE — Progress Notes (Signed)
  Subjective:    Patient ID: Diane [name], female    DOB: [birth year], 42 y.o.   MRN: [id number]  HPI  she would like to discuss anticoagulation. She had a pulmonary embolism about 2 years ago after traveling in the car for extended period of time. No other known triggers. She was on birth control at the time. She has been on Coumadin ever since. She has major fluctuations in her level and has great difficulty keeping it under control. She wants to discuss newer agents on the market. She is most worried about skin, hair and nail changes with the medications and also with nausea. She says she's very sensitive to many medications and gets nauseated very easily.  She did mention that one of her psychiatric medications has changed.  Review of Systems     Objective:   Physical Exam  Constitutional: She is oriented to person, place, and time. She appears well-developed and well-nourished.  HENT:  Head: Normocephalic and atraumatic.  Eyes: Conjunctivae and EOM are normal.  Cardiovascular: Normal rate.   Pulmonary/Chest: Effort normal.  Neurological: She is alert and oriented to person, place, and time.  Skin: Skin is dry. No pallor.  Psychiatric: She has a normal mood and affect. Her behavior is normal.          Assessment & Plan:  History of pulmonary embolism-I would like to go back and check to see if she really needs to continue anticoagulation therapy or not. She might be fairly low risk in which case we can discontinue it. If she is higher risk then we can discuss some of the alternative treatments. It looks like her insurance will cover Xarelto or Eliquis.  We decided to investigate both of these drugs a little bit further. I discussed potential side effects including irreversibility of the medications in case she were to have a major bleed. I did discuss increased risk of stroke and bleeding. I also gave her handouts for both drugs off of up to date so that she can review these  while I am investigating whether not she even needs to be on any anticoagulation. Followup in 2 weeks. If we do decide to stop her Coumadin then when she comes off of it for couple weeks I would like to repeat her anticoagulation panel.  Primary Imodium-see anticoagulation flowsheet for adjustments to her Coumadin. Follow up in 2 weeks.

## 2012-07-15 ENCOUNTER — Telehealth: Payer: Self-pay | Admitting: *Deleted

## 2012-07-15 NOTE — Telephone Encounter (Signed)
I advised Diane [name] to stop her coumadin. She was very excited and told me to tell you thank you! She will return in 2 weeks for blood work. She has a follow up appointment with you on 07/18/12. Should she still keep this appointment?

## 2012-07-15 NOTE — Telephone Encounter (Signed)
Called and informed pt that she only has to come in to have her labs drawn and she can do this in the next 2-3 weeks. I cancelled the appt with Dr. Linford Arnold on 3/28 .Loralee Pacas [place]

## 2012-07-18 ENCOUNTER — Ambulatory Visit: Payer: 59 | Admitting: Family Medicine

## 2012-07-24 DIAGNOSIS — F3160 Bipolar disorder, current episode mixed, unspecified: Secondary | ICD-10-CM | POA: Diagnosis not present

## 2012-08-01 ENCOUNTER — Other Ambulatory Visit: Payer: Self-pay | Admitting: *Deleted

## 2012-08-01 MED ORDER — PROMETHAZINE HCL 25 MG PO TABS: 25.0000 mg | ORAL_TABLET | Freq: Four times a day (QID) | ORAL | Status: DC | PRN

## 2012-08-05 ENCOUNTER — Other Ambulatory Visit: Payer: Self-pay | Admitting: Family Medicine

## 2012-08-07 DIAGNOSIS — F3160 Bipolar disorder, current episode mixed, unspecified: Secondary | ICD-10-CM | POA: Diagnosis not present

## 2012-08-07 DIAGNOSIS — E039 Hypothyroidism, unspecified: Secondary | ICD-10-CM | POA: Diagnosis not present

## 2012-08-07 DIAGNOSIS — E282 Polycystic ovarian syndrome: Secondary | ICD-10-CM | POA: Diagnosis not present

## 2012-08-21 DIAGNOSIS — F3160 Bipolar disorder, current episode mixed, unspecified: Secondary | ICD-10-CM | POA: Diagnosis not present

## 2012-08-28 ENCOUNTER — Telehealth: Payer: Self-pay | Admitting: *Deleted

## 2012-08-28 MED ORDER — HYDROCODONE-ACETAMINOPHEN 5-325 MG PO TABS: 2.0000 | ORAL_TABLET | Freq: Two times a day (BID) | ORAL | Status: DC | PRN

## 2012-08-28 NOTE — Telephone Encounter (Signed)
Pt notified med sent to pharmacy-[facility] in [place]. Barry Dienes, LPN

## 2012-08-28 NOTE — Telephone Encounter (Signed)
OK to fill

## 2012-08-28 NOTE — Telephone Encounter (Signed)
Pt calls and request a refill on the Hydrocodone that she uses for pain and headaches. Last refill was 18 months ago. Ok to fill

## 2012-09-05 DIAGNOSIS — F3160 Bipolar disorder, current episode mixed, unspecified: Secondary | ICD-10-CM | POA: Diagnosis not present

## 2012-09-16 ENCOUNTER — Telehealth: Payer: Self-pay | Admitting: *Deleted

## 2012-09-16 DIAGNOSIS — I10 Essential (primary) hypertension: Secondary | ICD-10-CM | POA: Diagnosis not present

## 2012-09-16 DIAGNOSIS — F411 Generalized anxiety disorder: Secondary | ICD-10-CM | POA: Diagnosis not present

## 2012-09-16 DIAGNOSIS — Z79899 Other long term (current) drug therapy: Secondary | ICD-10-CM | POA: Diagnosis not present

## 2012-09-16 DIAGNOSIS — F329 Major depressive disorder, single episode, unspecified: Secondary | ICD-10-CM | POA: Diagnosis not present

## 2012-09-16 DIAGNOSIS — R0789 Other chest pain: Secondary | ICD-10-CM | POA: Diagnosis not present

## 2012-09-16 NOTE — Telephone Encounter (Signed)
Pt calls & states that when she got out of the shower today she felt extremely weak & had to sit down.  She states that her chest feels "a little tight", she states that she feels "flu sick".  There is a hx of PE.  What should she do? Please advise

## 2012-09-16 NOTE — Telephone Encounter (Signed)
Go to ED since no appts available. Pt notified and agrees to go

## 2012-09-24 DIAGNOSIS — F319 Bipolar disorder, unspecified: Secondary | ICD-10-CM | POA: Diagnosis not present

## 2012-09-26 DIAGNOSIS — F3160 Bipolar disorder, current episode mixed, unspecified: Secondary | ICD-10-CM | POA: Diagnosis not present

## 2012-09-29 ENCOUNTER — Telehealth: Payer: Self-pay | Admitting: *Deleted

## 2012-09-29 NOTE — Telephone Encounter (Signed)
Pt calls and LM on voicemail stating she went to ED per recommendation of this office and they diagnosed her with bronchitis and dehydration. Her D Dimer was high at 350 and they looked for a clot and did not find one.  Pt states she is still SOB and has some chest pain. Has appt with you on Friday.  Tried to call pt back to get more details and had to leave message on her VM to call us back. Barry Dienes, LPN

## 2012-09-30 ENCOUNTER — Other Ambulatory Visit: Payer: Self-pay | Admitting: *Deleted

## 2012-09-30 NOTE — Telephone Encounter (Signed)
Can we get her in sooner?  Even if need to see [facility] tomorrow.

## 2012-09-30 NOTE — Telephone Encounter (Signed)
Went to ED 2 weeks ago. States off and on has had SOB, chest pain some and weak. Pt states finished the Levaquin course of 10 days.  Pt states feels weaker now than when she was diagnosed with bronchitis. Again has appt on Friday with you. Barry Dienes, LPN

## 2012-09-30 NOTE — Telephone Encounter (Signed)
LMOM for pt to return call. Jovi Zavadil, LPN  

## 2012-09-30 NOTE — Telephone Encounter (Signed)
Patient calls back and LM on VM for [place] and instructed [place] to call pt and schedule her on [place] schedule for tomorrow. Barry Dienes, LPN

## 2012-10-01 ENCOUNTER — Encounter: Payer: Self-pay | Admitting: Physician Assistant

## 2012-10-01 ENCOUNTER — Ambulatory Visit (INDEPENDENT_AMBULATORY_CARE_PROVIDER_SITE_OTHER): Payer: 59 | Admitting: Physician Assistant

## 2012-10-01 ENCOUNTER — Ambulatory Visit: Payer: 59 | Admitting: Family Medicine

## 2012-10-01 VITALS — BP 122/83 | HR 104 | Wt 197.0 lb

## 2012-10-01 DIAGNOSIS — Z86711 Personal history of pulmonary embolism: Secondary | ICD-10-CM | POA: Diagnosis not present

## 2012-10-01 DIAGNOSIS — R079 Chest pain, unspecified: Secondary | ICD-10-CM

## 2012-10-01 DIAGNOSIS — R0602 Shortness of breath: Secondary | ICD-10-CM

## 2012-10-01 MED ORDER — LUBIPROSTONE 8 MCG PO CAPS: 8.0000 ug | ORAL_CAPSULE | Freq: Two times a day (BID) | ORAL | Status: DC

## 2012-10-01 NOTE — Patient Instructions (Addendum)
Will get stress test.  

## 2012-10-01 NOTE — Progress Notes (Signed)
  Subjective:    Patient ID: Diane [name], female    DOB: 1971-04-03, 42 y.o.   MRN: [id number]  HPI Patient is a 42 yo female who presents to the clinic to follow up on ER visit to [facility] for SOB on 09/29/12. She called our office and because of her history of PE was told to go to ER. D-dimer was elevated but CTA scan was negative. UA/labs was good via patient except signs of dehydration. She was told she had mild dehydration and bronchitis. She was started on antibiotic but does not remember the name she thinks it might be levaquin. She has finished the antibiotic. She feels much better today. She still has some SOB when she exerts herself like walking up a flight of stairs and also a sharp pain in the center of her chest. She is very tired but usually all of a sudden. Dr. Linford Arnold tested her for mono last month and was negative. She was recently taken off coumadin but never had hypercogulation panel done.  Denies any fever, chills, upper respiratory symptoms.   Called for fax never received.   Review of Systems     Objective:   Physical Exam  Constitutional: She is oriented to person, place, and time. She appears well-developed and well-nourished.  overweight  HENT:  Head: Normocephalic and atraumatic.  Cardiovascular: Normal rate, regular rhythm and normal heart sounds.   Pulmonary/Chest: Effort normal and breath sounds normal. She has no wheezes.  Neurological: She is alert and oriented to person, place, and time.  Skin: Skin is warm and dry.  Psychiatric: She has a normal mood and affect. Her behavior is normal.          Assessment & Plan:  CP on exertion/SOB with exertion/history of PE- Explained to her that symptoms could most certainly be from getting over bronchitis. Continue to drink water. Gave note for pt to be able to bring water to class to make sure she does not get dehydrated. Will recheck CMP. Would also like to order hypercoagulation panel. Pt  has not had a stress test in a while and since symptoms are always with exertion i would like to rule out stress on heart.

## 2012-10-02 LAB — CBC WITH DIFFERENTIAL/PLATELET
Basophils Relative: 0 % (ref 0–1)
Eosinophils Absolute: 0 10*3/uL (ref 0.0–0.7)
Eosinophils Relative: 1 % (ref 0–5)
HCT: 39.5 % (ref 36.0–46.0)
Lymphs Abs: 3.6 10*3/uL (ref 0.7–4.0)
MCH: 26.6 pg (ref 26.0–34.0)
MCHC: 33.7 g/dL (ref 30.0–36.0)
MCV: 79 fL (ref 78.0–100.0)
Monocytes Relative: 6 % (ref 3–12)
Neutrophils Relative %: 52 % (ref 43–77)
WBC: 8.7 10*3/uL (ref 4.0–10.5)

## 2012-10-02 LAB — BASIC METABOLIC PANEL
Calcium: 9.6 mg/dL (ref 8.4–10.5)
Glucose, Bld: 64 mg/dL — ABNORMAL LOW (ref 70–99)
Sodium: 130 mEq/L — ABNORMAL LOW (ref 135–145)

## 2012-10-03 ENCOUNTER — Ambulatory Visit: Payer: 59 | Admitting: Family Medicine

## 2012-10-03 DIAGNOSIS — F3160 Bipolar disorder, current episode mixed, unspecified: Secondary | ICD-10-CM | POA: Diagnosis not present

## 2012-10-03 LAB — HYPERCOAGULABLE PANEL, COMPREHENSIVE
Anticardiolipin IgA: 4 APL U/mL (ref <22)
Anticardiolipin IgM: 32 MPL U/mL — ABNORMAL HIGH (ref <11)
DRVVT 1:1 Mix: 38 secs (ref <42.9)
DRVVT: 46.2 secs — ABNORMAL HIGH (ref <42.9)
PTT Lupus Anticoagulant: 39 secs (ref 28.0–43.0)
Protein C Activity: 200 % — ABNORMAL HIGH (ref 75–133)
Protein S Activity: 146 % — ABNORMAL HIGH (ref 69–129)

## 2012-10-08 DIAGNOSIS — F319 Bipolar disorder, unspecified: Secondary | ICD-10-CM | POA: Diagnosis not present

## 2012-10-10 DIAGNOSIS — F3160 Bipolar disorder, current episode mixed, unspecified: Secondary | ICD-10-CM | POA: Diagnosis not present

## 2012-10-14 ENCOUNTER — Other Ambulatory Visit: Payer: Self-pay | Admitting: Family Medicine

## 2012-10-14 DIAGNOSIS — D699 Hemorrhagic condition, unspecified: Secondary | ICD-10-CM

## 2012-10-14 DIAGNOSIS — I2699 Other pulmonary embolism without acute cor pulmonale: Secondary | ICD-10-CM

## 2012-10-15 ENCOUNTER — Telehealth: Payer: Self-pay | Admitting: Hematology & Oncology

## 2012-10-15 DIAGNOSIS — F319 Bipolar disorder, unspecified: Secondary | ICD-10-CM | POA: Diagnosis not present

## 2012-10-15 DIAGNOSIS — F3160 Bipolar disorder, current episode mixed, unspecified: Secondary | ICD-10-CM | POA: Diagnosis not present

## 2012-10-15 NOTE — Telephone Encounter (Signed)
Left message for her to call and schedule appointment

## 2012-10-17 ENCOUNTER — Telehealth: Payer: Self-pay | Admitting: Hematology & Oncology

## 2012-10-17 NOTE — Telephone Encounter (Signed)
Pt aware of 7-31 appointment. She could only come on wens,thurs or fri in the afternoon

## 2012-10-20 ENCOUNTER — Other Ambulatory Visit: Payer: Self-pay | Admitting: Family Medicine

## 2012-10-22 DIAGNOSIS — F319 Bipolar disorder, unspecified: Secondary | ICD-10-CM | POA: Diagnosis not present

## 2012-10-23 DIAGNOSIS — F3160 Bipolar disorder, current episode mixed, unspecified: Secondary | ICD-10-CM | POA: Diagnosis not present

## 2012-11-20 ENCOUNTER — Ambulatory Visit: Payer: 59

## 2012-11-20 ENCOUNTER — Telehealth: Payer: Self-pay | Admitting: Hematology & Oncology

## 2012-11-20 ENCOUNTER — Other Ambulatory Visit: Payer: 59 | Admitting: Lab

## 2012-11-20 ENCOUNTER — Ambulatory Visit: Payer: 59 | Admitting: Hematology & Oncology

## 2012-11-20 NOTE — Telephone Encounter (Signed)
Pt called said she has migraines and cx 7-31 moved to 9-4. She stated she could only come on Thursday afternoon

## 2012-11-26 ENCOUNTER — Other Ambulatory Visit: Payer: Self-pay | Admitting: *Deleted

## 2012-11-26 ENCOUNTER — Other Ambulatory Visit: Payer: Self-pay | Admitting: Family Medicine

## 2012-11-26 DIAGNOSIS — R079 Chest pain, unspecified: Secondary | ICD-10-CM

## 2012-11-26 DIAGNOSIS — R0602 Shortness of breath: Secondary | ICD-10-CM

## 2012-11-26 DIAGNOSIS — I2699 Other pulmonary embolism without acute cor pulmonale: Secondary | ICD-10-CM

## 2012-11-26 DIAGNOSIS — Z86711 Personal history of pulmonary embolism: Secondary | ICD-10-CM

## 2012-11-26 NOTE — Addendum Note (Signed)
Addended by: Deno Etienne on: 11/26/2012 04:52 PM   Modules accepted: Orders

## 2012-11-26 NOTE — Addendum Note (Signed)
Addended by: Deno Etienne on: 11/26/2012 05:00 PM   Modules accepted: Orders

## 2012-11-27 LAB — BASIC METABOLIC PANEL
BUN: 12 mg/dL (ref 6–23)
Calcium: 10 mg/dL (ref 8.4–10.5)
Chloride: 100 mEq/L (ref 96–112)
Creat: 0.99 mg/dL (ref 0.50–1.10)

## 2012-11-27 LAB — D-DIMER, QUANTITATIVE: D-Dimer, Quant: 0.45 ug/mL-FEU (ref 0.00–0.48)

## 2012-11-28 LAB — HYPERCOAGULABLE PANEL, COMPREHENSIVE RET.
AntiThromb III Func: 138 % — ABNORMAL HIGH (ref 76–126)
DRVVT 1:1 Mix: 35.6 secs (ref <42.9)
DRVVT: 45.1 secs — ABNORMAL HIGH (ref <42.9)
Homocysteine: 7 umol/L (ref 4.0–15.4)
Lupus Anticoagulant: DETECTED — AB
Protein C Activity: 200 % — ABNORMAL HIGH (ref 75–133)
Protein S Total: 112 % (ref 60–150)

## 2012-11-28 LAB — HYPERCOAGULABLE PANEL, COMPREHENSIVE
AntiThromb III Func: 131 % — ABNORMAL HIGH (ref 76–126)
Anticardiolipin IgA: 3 APL U/mL (ref <22)
Beta-2-Glycoprotein I IgM: 1 M Units (ref <20)
DRVVT 1:1 Mix: 37.1 secs (ref <42.9)
Lupus Anticoagulant: NOT DETECTED
PTTLA 4:1 Mix: 42 secs (ref 28.0–43.0)
Protein S Activity: 175 % — ABNORMAL HIGH (ref 69–129)

## 2012-12-01 ENCOUNTER — Telehealth: Payer: Self-pay | Admitting: Family Medicine

## 2012-12-01 ENCOUNTER — Telehealth: Payer: Self-pay | Admitting: Hematology & Oncology

## 2012-12-01 DIAGNOSIS — R760 Raised antibody titer: Secondary | ICD-10-CM

## 2012-12-01 NOTE — Telephone Encounter (Deleted)
Ms. Medders would like to talk more to Dr. about her recent diagnosis of Lupus also wants to be scheduled with a Rhuematologist asap

## 2012-12-01 NOTE — Telephone Encounter (Signed)
Pt called wanting sooner appointment said was positive antigen for Lupus. Per Dr. Myna Hidalgo pt needs to keep 9-4 for [place] and call PCP about other. Pt aware

## 2012-12-01 NOTE — Telephone Encounter (Signed)
Lupus, is technically a clinical diagnosis meaning it has to be diagnosed based on symptoms and blood work and does help support the diagnosis. Thus she will have to see the rheumatologist to fully determine if she meets the criteria for lupus.

## 2012-12-01 NOTE — Telephone Encounter (Signed)
Diane [name] contacted Haematologist and was told that she needs to see a Rheumatologist also wants to talk to Dr more about diagnosis.

## 2012-12-01 NOTE — Telephone Encounter (Signed)
Pt wanted to know that since this result came back positive does this mean that she actually have lupus?

## 2012-12-01 NOTE — Telephone Encounter (Signed)
Pt lvm stating that hematology told her that she would need to be seen by Rheumatology.  she would like to see Dr. Kathi Ludwig that is a rheumatologist that is in her network.Diane [name], Diane [name]

## 2012-12-01 NOTE — Telephone Encounter (Signed)
Pt informed.Diane [name]  

## 2012-12-05 DIAGNOSIS — D6862 Lupus anticoagulant syndrome: Secondary | ICD-10-CM | POA: Insufficient documentation

## 2012-12-05 DIAGNOSIS — Z86711 Personal history of pulmonary embolism: Secondary | ICD-10-CM | POA: Insufficient documentation

## 2012-12-10 DIAGNOSIS — F319 Bipolar disorder, unspecified: Secondary | ICD-10-CM | POA: Diagnosis not present

## 2012-12-11 DIAGNOSIS — F319 Bipolar disorder, unspecified: Secondary | ICD-10-CM | POA: Diagnosis not present

## 2012-12-17 ENCOUNTER — Other Ambulatory Visit: Payer: Self-pay | Admitting: Family Medicine

## 2012-12-17 DIAGNOSIS — F319 Bipolar disorder, unspecified: Secondary | ICD-10-CM | POA: Diagnosis not present

## 2012-12-18 DIAGNOSIS — F319 Bipolar disorder, unspecified: Secondary | ICD-10-CM | POA: Diagnosis not present

## 2012-12-23 DIAGNOSIS — R768 Other specified abnormal immunological findings in serum: Secondary | ICD-10-CM | POA: Insufficient documentation

## 2012-12-24 DIAGNOSIS — F319 Bipolar disorder, unspecified: Secondary | ICD-10-CM | POA: Diagnosis not present

## 2012-12-25 ENCOUNTER — Ambulatory Visit (HOSPITAL_BASED_OUTPATIENT_CLINIC_OR_DEPARTMENT_OTHER): Payer: 59 | Admitting: Hematology & Oncology

## 2012-12-25 ENCOUNTER — Ambulatory Visit: Payer: 59

## 2012-12-25 ENCOUNTER — Other Ambulatory Visit: Payer: 59 | Admitting: Lab

## 2012-12-25 VITALS — BP 122/70 | HR 83 | Temp 98.0°F | Resp 16 | Ht 63.0 in | Wt 212.0 lb

## 2012-12-25 DIAGNOSIS — I2699 Other pulmonary embolism without acute cor pulmonale: Secondary | ICD-10-CM | POA: Diagnosis not present

## 2012-12-25 DIAGNOSIS — M329 Systemic lupus erythematosus, unspecified: Secondary | ICD-10-CM

## 2012-12-25 DIAGNOSIS — Z7982 Long term (current) use of aspirin: Secondary | ICD-10-CM | POA: Diagnosis not present

## 2012-12-25 DIAGNOSIS — D6859 Other primary thrombophilia: Secondary | ICD-10-CM

## 2012-12-25 NOTE — Progress Notes (Signed)
This office note has been dictated.

## 2012-12-26 ENCOUNTER — Encounter: Payer: Self-pay | Admitting: Family Medicine

## 2012-12-26 DIAGNOSIS — D6861 Antiphospholipid syndrome: Secondary | ICD-10-CM | POA: Insufficient documentation

## 2012-12-26 NOTE — Progress Notes (Signed)
CC:   Nani Gasser, M.D.  DIAGNOSES: 1. Pulmonary embolism-October 2012. 2. Elevated IgM anticardiolipin antibody. 3. Possible systemic lupus.  HISTORY OF PRESENT ILLNESS:  Ms. Arizpe is a very nice 42 year old African American female.  She has a history of bipolar disorder.  She was diagnosed with this probably about 19 years ago.  Back in October 2012, she apparently had a pulmonary embolism.  She was treated over at [facility].  She was on Coumadin for a little over a year.  This apparently happened after she got back from a long trip.  She went to the [employer].  She feels that this happened because she was under a lot of stress at that time of her life.  She did well on the Coumadin.  She had no complications from this.  Apparently, she has been having some issues this year.  She has been having some chest issues.  She went to the ER back in June.  A D-dimer was elevated, but CT angiogram was negative.  She subsequently had a hypercoagulable panel done.  It does not sound like this was done when she initially had her pulmonary embolism.  She had a hypercoagulable panel which showed an elevated IgM anticardiolipin antibody at a titer of 32.  She had normal beta-2 glycoprotein levels. She was negative for factor V Leiden and prothrombin 2 gene mutation. She had a negative lupus anticoagulant.  She had some lab work done.  A CBC was done which showed a white cell count of 8.7, hemoglobin 13.3, hematocrit 39.5, and platelet count was 294.  She had normal BUN and creatinine.  Glucose was a little on the low side.  Because of this elevated IgM anticardiolipin antibody level, she was felt that hematologic input was needed to see if she needed to be on anticoagulation.  She currently is on aspirin 325 mg a day.  She apparently was recently diagnosed with lupus.  She sees Dr. Kathi Ludwig over in [place].  She has some titers that are positive for lupus. It  sounds like she is on Plaquenil now.  Overall, she is doing okay.  She is not too keen on taking a lot of pills.  She has had no leg swelling.  She has had no rashes.  She has had no photophobia.  She has had no joint issues.  There has been no change in bowel or bladder habits.  She had a mammogram earlier this year.  PAST MEDICAL HISTORY: 1. Remarkable for bipolar disorder. 2. Lupus. 3. Anxiety. 4. Hypothyroidism. 5. Non insulin-dependent diabetes. 6. High blood pressure. 7. Depression.  DRUG ALLERGIES: 1. Lisinopril. 2. Relpax.  MEDICATIONS:  Saphris 2.5 mg sublingual q. day, Klonopin 1 mg p.o. t.i.d. p.r.n., doxepin 3 mg p.o. at bedtime, Nexium 40 mg p.o. q. day, iron sulfate 1 p.o. q. day, Lasix 20 mg p.o. q. day, Neurontin 600 mg p.o. q. day, Synthroid 0.025 mg p.o. q. day, Amitiza 8 mcg p.o. q. day, metformin 500 mg p.o. b.i.d., Latuda 20 mg p.o. q. day, Aldactone 25 mg p.o. q. day, verapamil SR 240 mg p.o. q. day, Wellbutrin SR 100 mg p.o. q. day.  SOCIAL HISTORY:  Negative for tobacco use.  There is no alcohol use. She has no obvious occupational exposures.  FAMILY HISTORY:  Noncontributory.  There is no history of thromboembolic events in the family.  There is no history of collagen vascular disease in the family.  REVIEW OF SYSTEMS:  As stated in the history  of present illness.  No additional findings noted on a 12-system review.  PHYSICAL EXAMINATION:  General:  This is a well-developed, well- nourished African American female in no obvious distress.  Vital Signs: Show a temperature of 98, pulse 83, respiratory rate 16, blood pressure 122/70.  Weight is 212 pounds.  Head and Neck Exam:  Shows a normocephalic, atraumatic skull.  There are no ocular or oral lesions. There are no palpable cervical or supraclavicular lymph nodes.  Lungs: Clear bilaterally.  Cardiac Exam:  Regular rate and rhythm with a normal S1, S2.  There are no murmurs, rubs or bruits.   Abdomen:  Soft.  She has good bowel sounds.  There is no fluid wave.  There is no palpable hepatosplenomegaly.  Extremities:  Show no clubbing, cyanosis or edema. There is no palpable venous cord in her legs.  She has a negative Homan's sign bilaterally.  Skin Exam:  No rashes, ecchymoses or petechia.  Neurological Exam:  Shows no focal neurological deficits.  IMPRESSION:  Ms. Abdelrahman is a 42 year old African American female with a history of pulmonary embolism back in October 2012.  She is on Coumadin for probably about 15 months.  She is now on aspirin.  This is not listed on her medical profile, but she says she is taking 325 mg of aspirin a day.  The elevated anticardiolipin antibody assay is not indicative of her being hypercoagulable.  One would think that she also would have an elevated beta-2 glycoprotein assay.  The fact that she has lupus could certainly increase her risk for a thromboembolic disease.  It also could increase the risk of bleeding.  She has minimized other risk factors.  She has non insulin-dependent diabetes as the only other risk factor that I can see.  I do not see that we have to do any testing on her.  I do not see that we have to get her back to see unless she does have a thromboembolic event.  I think the risk of her having a thromboembolic event is probably going to be less than 10%.  Ms. Matsen is very nice.  She has a strong faith.  We did give her a prayer blanket, which she very much appreciated.  I will not have to get her back to see [place], again, unless she does have a thromboembolic event.  If she does, she will need lifelong anticoagulation.    ______________________________ Josph Macho, M.D. PRE/MEDQ  D:  12/25/2012  T:  12/26/2012  Job:  [id number]

## 2012-12-31 DIAGNOSIS — F319 Bipolar disorder, unspecified: Secondary | ICD-10-CM | POA: Diagnosis not present

## 2013-01-01 DIAGNOSIS — F319 Bipolar disorder, unspecified: Secondary | ICD-10-CM | POA: Diagnosis not present

## 2013-01-07 DIAGNOSIS — F319 Bipolar disorder, unspecified: Secondary | ICD-10-CM | POA: Diagnosis not present

## 2013-01-08 DIAGNOSIS — F319 Bipolar disorder, unspecified: Secondary | ICD-10-CM | POA: Diagnosis not present

## 2013-01-14 DIAGNOSIS — F319 Bipolar disorder, unspecified: Secondary | ICD-10-CM | POA: Diagnosis not present

## 2013-01-15 DIAGNOSIS — F319 Bipolar disorder, unspecified: Secondary | ICD-10-CM | POA: Diagnosis not present

## 2013-01-19 ENCOUNTER — Other Ambulatory Visit: Payer: Self-pay | Admitting: Family Medicine

## 2013-01-21 ENCOUNTER — Other Ambulatory Visit: Payer: Self-pay | Admitting: Family Medicine

## 2013-01-21 DIAGNOSIS — F319 Bipolar disorder, unspecified: Secondary | ICD-10-CM | POA: Diagnosis not present

## 2013-01-22 DIAGNOSIS — F309 Manic episode, unspecified: Secondary | ICD-10-CM | POA: Diagnosis not present

## 2013-01-22 DIAGNOSIS — IMO0001 Reserved for inherently not codable concepts without codable children: Secondary | ICD-10-CM | POA: Diagnosis not present

## 2013-01-22 DIAGNOSIS — M329 Systemic lupus erythematosus, unspecified: Secondary | ICD-10-CM | POA: Diagnosis not present

## 2013-01-22 DIAGNOSIS — E669 Obesity, unspecified: Secondary | ICD-10-CM | POA: Diagnosis not present

## 2013-01-22 DIAGNOSIS — R5381 Other malaise: Secondary | ICD-10-CM | POA: Diagnosis not present

## 2013-01-22 DIAGNOSIS — R5383 Other fatigue: Secondary | ICD-10-CM | POA: Insufficient documentation

## 2013-01-28 DIAGNOSIS — F319 Bipolar disorder, unspecified: Secondary | ICD-10-CM | POA: Diagnosis not present

## 2013-02-04 DIAGNOSIS — F319 Bipolar disorder, unspecified: Secondary | ICD-10-CM | POA: Diagnosis not present

## 2013-02-05 DIAGNOSIS — F319 Bipolar disorder, unspecified: Secondary | ICD-10-CM | POA: Diagnosis not present

## 2013-02-06 DIAGNOSIS — E282 Polycystic ovarian syndrome: Secondary | ICD-10-CM | POA: Diagnosis not present

## 2013-02-06 DIAGNOSIS — E039 Hypothyroidism, unspecified: Secondary | ICD-10-CM | POA: Diagnosis not present

## 2013-02-11 DIAGNOSIS — F319 Bipolar disorder, unspecified: Secondary | ICD-10-CM | POA: Diagnosis not present

## 2013-02-12 DIAGNOSIS — M25569 Pain in unspecified knee: Secondary | ICD-10-CM | POA: Insufficient documentation

## 2013-02-12 DIAGNOSIS — M659 Synovitis and tenosynovitis, unspecified: Secondary | ICD-10-CM | POA: Insufficient documentation

## 2013-02-12 DIAGNOSIS — R894 Abnormal immunological findings in specimens from other organs, systems and tissues: Secondary | ICD-10-CM | POA: Diagnosis not present

## 2013-02-12 DIAGNOSIS — M6580 Other synovitis and tenosynovitis, unspecified site: Secondary | ICD-10-CM | POA: Diagnosis not present

## 2013-02-18 DIAGNOSIS — F319 Bipolar disorder, unspecified: Secondary | ICD-10-CM | POA: Diagnosis not present

## 2013-02-19 ENCOUNTER — Other Ambulatory Visit: Payer: Self-pay | Admitting: Family Medicine

## 2013-02-19 DIAGNOSIS — F319 Bipolar disorder, unspecified: Secondary | ICD-10-CM | POA: Diagnosis not present

## 2013-02-24 ENCOUNTER — Other Ambulatory Visit: Payer: Self-pay | Admitting: Family Medicine

## 2013-02-26 DIAGNOSIS — F319 Bipolar disorder, unspecified: Secondary | ICD-10-CM | POA: Diagnosis not present

## 2013-02-27 DIAGNOSIS — M6580 Other synovitis and tenosynovitis, unspecified site: Secondary | ICD-10-CM | POA: Diagnosis not present

## 2013-02-27 DIAGNOSIS — M1990 Unspecified osteoarthritis, unspecified site: Secondary | ICD-10-CM | POA: Diagnosis not present

## 2013-02-27 DIAGNOSIS — M25569 Pain in unspecified knee: Secondary | ICD-10-CM | POA: Diagnosis not present

## 2013-02-27 DIAGNOSIS — R894 Abnormal immunological findings in specimens from other organs, systems and tissues: Secondary | ICD-10-CM | POA: Diagnosis not present

## 2013-03-02 ENCOUNTER — Encounter: Payer: Self-pay | Admitting: Family Medicine

## 2013-03-02 ENCOUNTER — Ambulatory Visit (INDEPENDENT_AMBULATORY_CARE_PROVIDER_SITE_OTHER): Payer: 59 | Admitting: Family Medicine

## 2013-03-02 VITALS — BP 139/93 | HR 94 | Temp 97.0°F | Wt 211.0 lb

## 2013-03-02 DIAGNOSIS — J029 Acute pharyngitis, unspecified: Secondary | ICD-10-CM | POA: Diagnosis not present

## 2013-03-02 DIAGNOSIS — J019 Acute sinusitis, unspecified: Secondary | ICD-10-CM | POA: Diagnosis not present

## 2013-03-02 LAB — POCT RAPID STREP A (OFFICE): Rapid Strep A Screen: NEGATIVE

## 2013-03-02 MED ORDER — AMOXICILLIN-POT CLAVULANATE 875-125 MG PO TABS: 1.0000 | ORAL_TABLET | Freq: Two times a day (BID) | ORAL | Status: DC

## 2013-03-02 NOTE — Progress Notes (Signed)
  Subjective:    Patient ID: Diane [name], female    DOB: May 09, 1970, 42 y.o.   MRN: [id number]  HPI  6 days of sorethroat, nasla congestion, + HA.  No fever. + chills, no sweats.  usint tylenol flu otc cold medication.  Had some diarrhea.  She is currntly off of the Plaquenil.  She has had a lot of blood and mucous form hre throat.  Slight cough. + sneezing.  + hoarse  Review of Systems     Objective:   Physical Exam  Constitutional: She is oriented to person, place, and time. She appears well-developed and well-nourished.  HENT:  Head: Normocephalic and atraumatic.  Cardiovascular: Normal rate, regular rhythm and normal heart sounds.   Pulmonary/Chest: Effort normal and breath sounds normal.  Neurological: She is alert and oriented to person, place, and time.  Skin: Skin is warm and dry.  Psychiatric: She has a normal mood and affect. Her behavior is normal.          Assessment & Plan:  Acute sinusitis - will tx with antibiotic since has SLE and feeling worse. We'll start Augmentin. If she's not feeling significantly better in the next 72 hours then please call me back. Work note given for Monday Tuesday and Wednesday, to return on Thursday. Okay to continue over-the-counter symptomatic care. Discussed importance of hand hygiene.

## 2013-03-05 ENCOUNTER — Telehealth: Payer: Self-pay | Admitting: *Deleted

## 2013-03-05 DIAGNOSIS — F319 Bipolar disorder, unspecified: Secondary | ICD-10-CM | POA: Diagnosis not present

## 2013-03-05 NOTE — Telephone Encounter (Signed)
Okay for note for out this weeks return on Monday.

## 2013-03-05 NOTE — Telephone Encounter (Signed)
Pt left message stating that she is still sick & is unable to go to class any this week.  She wants to know if you can write her out of school for the whole week.

## 2013-03-06 ENCOUNTER — Encounter: Payer: Self-pay | Admitting: *Deleted

## 2013-03-06 ENCOUNTER — Other Ambulatory Visit: Payer: Self-pay | Admitting: *Deleted

## 2013-03-06 MED ORDER — PROMETHAZINE HCL 25 MG PO TABS: 25.0000 mg | ORAL_TABLET | Freq: Four times a day (QID) | ORAL | Status: DC | PRN

## 2013-03-06 NOTE — Telephone Encounter (Signed)
Pt called and stated that she is not feeling any better. Noticed that she had spoken with Amber yesterday and Dr.Metheney ok'd note to be written. New note written and faxed to ATTN: Shiela Mayer.Loralee Pacas [place]

## 2013-03-11 DIAGNOSIS — F319 Bipolar disorder, unspecified: Secondary | ICD-10-CM | POA: Diagnosis not present

## 2013-03-17 DIAGNOSIS — E669 Obesity, unspecified: Secondary | ICD-10-CM | POA: Diagnosis not present

## 2013-03-17 DIAGNOSIS — D6859 Other primary thrombophilia: Secondary | ICD-10-CM | POA: Diagnosis not present

## 2013-03-17 DIAGNOSIS — IMO0001 Reserved for inherently not codable concepts without codable children: Secondary | ICD-10-CM | POA: Diagnosis not present

## 2013-03-17 DIAGNOSIS — M25539 Pain in unspecified wrist: Secondary | ICD-10-CM | POA: Diagnosis not present

## 2013-03-17 DIAGNOSIS — M329 Systemic lupus erythematosus, unspecified: Secondary | ICD-10-CM | POA: Diagnosis not present

## 2013-03-17 DIAGNOSIS — E039 Hypothyroidism, unspecified: Secondary | ICD-10-CM | POA: Diagnosis not present

## 2013-03-17 DIAGNOSIS — M1990 Unspecified osteoarthritis, unspecified site: Secondary | ICD-10-CM | POA: Diagnosis not present

## 2013-03-17 DIAGNOSIS — K5730 Diverticulosis of large intestine without perforation or abscess without bleeding: Secondary | ICD-10-CM | POA: Diagnosis not present

## 2013-03-17 DIAGNOSIS — M6580 Other synovitis and tenosynovitis, unspecified site: Secondary | ICD-10-CM | POA: Diagnosis not present

## 2013-03-17 DIAGNOSIS — R5381 Other malaise: Secondary | ICD-10-CM | POA: Diagnosis not present

## 2013-03-17 DIAGNOSIS — M7710 Lateral epicondylitis, unspecified elbow: Secondary | ICD-10-CM | POA: Diagnosis not present

## 2013-03-17 DIAGNOSIS — M67919 Unspecified disorder of synovium and tendon, unspecified shoulder: Secondary | ICD-10-CM | POA: Diagnosis not present

## 2013-03-17 DIAGNOSIS — E282 Polycystic ovarian syndrome: Secondary | ICD-10-CM | POA: Diagnosis not present

## 2013-03-17 DIAGNOSIS — M359 Systemic involvement of connective tissue, unspecified: Secondary | ICD-10-CM | POA: Diagnosis not present

## 2013-03-17 DIAGNOSIS — G43909 Migraine, unspecified, not intractable, without status migrainosus: Secondary | ICD-10-CM | POA: Diagnosis not present

## 2013-03-17 DIAGNOSIS — R635 Abnormal weight gain: Secondary | ICD-10-CM | POA: Diagnosis not present

## 2013-03-17 DIAGNOSIS — F309 Manic episode, unspecified: Secondary | ICD-10-CM | POA: Diagnosis not present

## 2013-03-24 ENCOUNTER — Other Ambulatory Visit: Payer: Self-pay | Admitting: Family Medicine

## 2013-03-25 ENCOUNTER — Telehealth: Payer: Self-pay | Admitting: *Deleted

## 2013-03-25 NOTE — Telephone Encounter (Signed)
Pt called and stated that she fell last night at school. She has made an appt for tomorrow @ 2pm.Cutberto Winfree, Viann Shove

## 2013-03-26 ENCOUNTER — Encounter: Payer: Self-pay | Admitting: Family Medicine

## 2013-03-26 ENCOUNTER — Ambulatory Visit (INDEPENDENT_AMBULATORY_CARE_PROVIDER_SITE_OTHER): Payer: 59 | Admitting: Family Medicine

## 2013-03-26 VITALS — BP 129/90 | HR 87 | Temp 97.8°F | Ht 63.0 in | Wt 210.0 lb

## 2013-03-26 DIAGNOSIS — R42 Dizziness and giddiness: Secondary | ICD-10-CM

## 2013-03-26 DIAGNOSIS — S060X0A Concussion without loss of consciousness, initial encounter: Secondary | ICD-10-CM | POA: Diagnosis not present

## 2013-03-26 DIAGNOSIS — F319 Bipolar disorder, unspecified: Secondary | ICD-10-CM | POA: Diagnosis not present

## 2013-03-26 NOTE — Patient Instructions (Signed)
Post-Concussion Syndrome  Post-concussion syndrome describes the symptoms that can occur after a head injury. These symptoms can last from weeks to months.  CAUSES   It is not clear why some head injuries cause post-concussion syndrome. It can occur whether your head injury was mild or severe and whether you were wearing head protection or not.   SYMPTOMS   Memory difficulties.   Dizziness.   Headaches.   Double vision or blurry vision.   Sensitivity to light.   Hearing difficulties.   Depression.   Tiredness.   Weakness.   Difficulty with concentration.   Difficulty sleeping or staying asleep.   Vomiting.  DIAGNOSIS   There is no test to determine whether you have post-concussion syndrome. Your caregiver may order an imaging scan of your brain, such as a CT scan, to check for other problems that may be causing your symptoms (such as severe injury inside your skull).  TREATMENT   Usually, these problems disappear over time without medical care. Your caregiver may prescribe medicine to help ease your symptoms. It is important to follow up with a neurologist to evaluate your recovery and address any lingering symptoms or issues.  HOME CARE INSTRUCTIONS    Only take over-the-counter or prescription medicines for pain, discomfort, or fever as directed by your caregiver. Do not take aspirin. Aspirin can slow blood clotting.   Sleep with your head slightly elevated to help with headaches.   Avoid any situation where there is potential for another head injury (football, hockey, martial arts, horseback riding). Your condition will get worse every time you experience a concussion. You should avoid these activities until you are evaluated by the appropriate follow-up caregivers.   Keep all follow-up appointments as directed by your caregiver.  SEEK IMMEDIATE MEDICAL CARE IF:   You develop confusion or unusual drowsiness.   You cannot wake the injured person.   You develop nausea or persistent, forceful  vomiting.   You feel like you are moving when you are not (vertigo).   You notice the injured person's eyes moving rapidly back and forth. This may be a sign of vertigo.   You have convulsions or faint.   You have severe, persistent headaches that are not relieved by medicine.   You cannot use your arms or legs normally.   Your pupils change size.   You have clear or bloody discharge from the nose or ears.   Your problems are getting worse, not better.  MAKE SURE YOU:   Understand these instructions.   Will watch your condition.   Will get help right away if you are not doing well or get worse.  Document Released: 09/29/2001 Document Revised: 07/02/2011 Document Reviewed: 10/26/2010  Exit[employer] Patient Information 2014 [employer].

## 2013-03-26 NOTE — Progress Notes (Signed)
   Subjective:    Patient ID: Diane [name], female    DOB: 08-Feb-1971, 42 y.o.   MRN: [id number]  HPI  Says 2 days ago got lightheaded and then lost her balance and fell backwards. She didn't skip meals and fell she was well-hydrated..  Says she didn't feel well that whole day.  Says she was bending over repeatedly doing laundry and then walked back to class and fell backwards because she felt like she lost her balance because she was lightheaded. Says has had a HA since then.  She says it starts in the back of her head and radiates around to the front. It is bilateral. She does not have any nodule or bruise on the back of her head which is where she hit. She also has some pain in her neck on the right side. She say her back hurt initially but that is better. She is on an ASA for her SLE.  Head his a concrete floor. Vision  Felt glassy. Has felt nauseated today.  No vomiting.  She also says her right knee has been more painful since the fall.  Review of Systems     Objective:   Physical Exam  Constitutional: She is oriented to person, place, and time. She appears well-developed and well-nourished.  HENT:  Head: Normocephalic and atraumatic.  Right Ear: External ear normal.  Left Ear: External ear normal.  Nose: Nose normal.  Mouth/Throat: Oropharynx is clear and moist.  TMs and canals are clear.   Eyes: Conjunctivae and EOM are normal. Pupils are equal, round, and reactive to light.  She does have colored contacts in today which makes it a little bit difficult to clearly see her pupil reaction with light.  Neck: Neck supple. No thyromegaly present.  Cardiovascular: Normal rate, regular rhythm and normal heart sounds.   Pulmonary/Chest: Effort normal and breath sounds normal. She has no wheezes.  Musculoskeletal:  Strength is normal in all 4 limbs. She's able to flex extend rotate and side bend the neck. She does have discomfort with full flexion and extension.  Lymphadenopathy:   She has no cervical adenopathy.  Neurological: She is alert and oriented to person, place, and time. She has normal reflexes. She displays normal reflexes. No cranial nerve deficit. She exhibits normal muscle tone. Coordination normal.  Skin: Skin is warm and dry.  Psychiatric: She has a normal mood and affect. Her behavior is normal. Judgment and thought content normal.          Assessment & Plan:  Postconcussive syndrome-I. think based on her symptoms she has a concussion. I want her to be on complete brain rest which includes no TV, computer, testing etc. for the next 5-6 days. She will see me back next Wednesday or Thursday and see if she is improved enough to release back to school. Work note given. If she feels her symptoms are changing or progressing then please let me know immediately and we'll consider CT scan of the head. She is on aspirin. Call immediately if any change in pain level, change in nausea or starts to vomit, or any change in vision. H.O given. Orthostatics are normal today. She has no other viral symptoms.

## 2013-03-28 ENCOUNTER — Emergency Department (HOSPITAL_BASED_OUTPATIENT_CLINIC_OR_DEPARTMENT_OTHER)
Admission: EM | Admit: 2013-03-28 | Discharge: 2013-03-29 | Disposition: A | Discharge status: Home / Self Care | Payer: 59 | Attending: Emergency Medicine | Admitting: Emergency Medicine

## 2013-03-28 ENCOUNTER — Encounter (HOSPITAL_BASED_OUTPATIENT_CLINIC_OR_DEPARTMENT_OTHER): Payer: Self-pay | Admitting: Emergency Medicine

## 2013-03-28 ENCOUNTER — Emergency Department (HOSPITAL_BASED_OUTPATIENT_CLINIC_OR_DEPARTMENT_OTHER): Payer: 59

## 2013-03-28 DIAGNOSIS — F319 Bipolar disorder, unspecified: Secondary | ICD-10-CM | POA: Diagnosis not present

## 2013-03-28 DIAGNOSIS — G44309 Post-traumatic headache, unspecified, not intractable: Secondary | ICD-10-CM | POA: Insufficient documentation

## 2013-03-28 DIAGNOSIS — E282 Polycystic ovarian syndrome: Secondary | ICD-10-CM | POA: Insufficient documentation

## 2013-03-28 DIAGNOSIS — K219 Gastro-esophageal reflux disease without esophagitis: Secondary | ICD-10-CM | POA: Insufficient documentation

## 2013-03-28 DIAGNOSIS — Z87448 Personal history of other diseases of urinary system: Secondary | ICD-10-CM | POA: Diagnosis not present

## 2013-03-28 DIAGNOSIS — Z79899 Other long term (current) drug therapy: Secondary | ICD-10-CM | POA: Diagnosis not present

## 2013-03-28 DIAGNOSIS — Z86711 Personal history of pulmonary embolism: Secondary | ICD-10-CM | POA: Diagnosis not present

## 2013-03-28 DIAGNOSIS — Z7982 Long term (current) use of aspirin: Secondary | ICD-10-CM | POA: Insufficient documentation

## 2013-03-28 DIAGNOSIS — R110 Nausea: Secondary | ICD-10-CM | POA: Diagnosis not present

## 2013-03-28 DIAGNOSIS — Z8711 Personal history of peptic ulcer disease: Secondary | ICD-10-CM | POA: Diagnosis not present

## 2013-03-28 DIAGNOSIS — IMO0001 Reserved for inherently not codable concepts without codable children: Secondary | ICD-10-CM | POA: Insufficient documentation

## 2013-03-28 DIAGNOSIS — I10 Essential (primary) hypertension: Secondary | ICD-10-CM | POA: Diagnosis not present

## 2013-03-28 DIAGNOSIS — E039 Hypothyroidism, unspecified: Secondary | ICD-10-CM | POA: Insufficient documentation

## 2013-03-28 DIAGNOSIS — F411 Generalized anxiety disorder: Secondary | ICD-10-CM | POA: Diagnosis not present

## 2013-03-28 DIAGNOSIS — G43909 Migraine, unspecified, not intractable, without status migrainosus: Secondary | ICD-10-CM | POA: Insufficient documentation

## 2013-03-28 DIAGNOSIS — R51 Headache: Secondary | ICD-10-CM | POA: Diagnosis not present

## 2013-03-28 HISTORY — DX: Systemic lupus erythematosus, unspecified: M32.9

## 2013-03-28 HISTORY — DX: Fibromyalgia: M79.7

## 2013-03-28 MED ORDER — ONDANSETRON 4 MG PO TBDP
4.0000 mg | ORAL_TABLET | Freq: Once | ORAL | Status: AC
  Administered 2013-03-28: 4 mg via ORAL
  Filled 2013-03-28: qty 1

## 2013-03-28 NOTE — ED Notes (Signed)
Pt states passes out tues , fell hit head,  Was seen by her md on thur,  Was told she had concussion,  Now c/o ha and nausea  Pt a&o,

## 2013-03-28 NOTE — ED Notes (Signed)
Pt seen at [facility] on Tuesday and dx with concussion after falling from standing "after i blacked out"- has meds for headache which "aren't working"- reports nausea. Took pain meds last night

## 2013-03-28 NOTE — ED Provider Notes (Signed)
CSN: [id number]     Arrival date & time 03/28/13  1949 History   First MD Initiated Contact with Patient 03/28/13 2223     Chief Complaint  Patient presents with  . Headache   (Consider location/radiation/quality/duration/timing/severity/associated sxs/prior Treatment) Patient is a 42 y.o. female presenting with headaches. The history is provided by the patient.  Headache Pain location:  L parietal Quality:  Sharp Radiates to:  L neck Onset quality:  Sudden Duration:  4 days Timing:  Constant Progression:  Worsening Chronicity:  New Relieved by:  Nothing Worsened by:  Light, neck movement and sound Associated symptoms: nausea   Associated symptoms: no abdominal pain, no back pain, no ear pain, no fever, no hearing loss and no vomiting    Diane [name] is a 42 y.o. female who presents to the ED with pain on the left side of her head. She states that she was walking around her bed and fell and hit her head. She went to her PCP 2 days later and was diagnoses with a concussion. She continues to have nausea and headaches. The symptoms have gotten worse sine her PCP visit.   Past Medical History  Diagnosis Date  . Anxiety   . Bipolar 1 disorder     rapid cycler-- Dr Wellington Hampshire  . Hypertension   . Hypothyroidism     Dr Sidney Ace ([facility])  . Water retention   . GERD (gastroesophageal reflux disease)     GI Dr Eulah Pont  . Interstitial cystitis   . Migraine   . Pulmonary embolism 01/25/11  . Polycystic ovarian disease   . Peptic ulcer disease   . Lupus   . Fibromyalgia    Past Surgical History  Procedure Laterality Date  . Cholecystectomy  2004   Family History  Problem Relation Age of Onset  . Anxiety disorder Other   . Depression Other   . Hypertension Other   . Diabetes Mother   . Urolithiasis Father    History  Substance Use Topics  . Smoking status: Never Smoker   . Smokeless tobacco: Never Used  . Alcohol Use: Yes     Comment: rarely   OB History   Grav  Para Term Preterm Abortions TAB SAB Ect Mult Living   0 0             Review of Systems  Constitutional: Negative for fever and chills.  HENT: Negative for ear pain, hearing loss, nosebleeds and trouble swallowing.   Eyes: Negative for visual disturbance.  Respiratory: Negative for shortness of breath.   Cardiovascular: Negative for chest pain.  Gastrointestinal: Positive for nausea. Negative for vomiting and abdominal pain.  Genitourinary: Negative for dysuria and urgency.  Musculoskeletal: Negative for back pain.  Skin: Negative for wound.  Neurological: Positive for headaches.  Psychiatric/Behavioral: Negative for confusion.    Allergies  Lisinopril and Relpax  Home Medications   Current Outpatient Rx  Name  Route  Sig  Dispense  Refill  . Armodafinil (NUVIGIL) 250 MG tablet   Oral   Take 250 mg by mouth daily.         Marland Kitchen asenapine (SAPHRIS) 5 MG SUBL 24 hr tablet   Sublingual   Place 2.5 mg under the tongue daily.         Marland Kitchen aspirin 325 MG tablet   Oral   Take 325 mg by mouth daily.         . clonazePAM (KLONOPIN) 1 MG tablet   Oral  Take 1 mg by mouth 3 (three) times daily as needed for anxiety.         Marland Kitchen esomeprazole (NEXIUM) 40 MG capsule   Oral   Take 40 mg by mouth daily before breakfast.           . ferrous sulfate 325 (65 FE) MG tablet      TAKE 1 TABLET BY MOUTH DAILY   30 tablet   0   . furosemide (LASIX) 20 MG tablet      TAKE 1 TABLET BY MOUTH DAILY   30 tablet   0   . Gabapentin Enacarbil 600 MG TB24   Oral   Take 600 mg by mouth daily.         . hydroquinone 4 % cream   Topical   Apply topically 2 (two) times daily.   30 g   0   . L-Methylfolate-Algae (DEPLIN 15 PO)   Oral   Take 1 tablet by mouth daily.         Marland Kitchen L-Methylfolate-B12-B6-B2 (CEREFOLIN) 09-21-48-5 MG TABS   Oral   Take 1 tablet by mouth 1 day or 1 dose.         . levothyroxine (SYNTHROID, LEVOTHROID) 25 MCG tablet   Oral   Take 25 mcg by mouth  daily.           . metFORMIN (GLUCOPHAGE) 500 MG tablet   Oral   Take 500 mg by mouth 2 (two) times daily with a meal.           . promethazine (PHENERGAN) 25 MG tablet   Oral   Take 1 tablet (25 mg total) by mouth every 6 (six) hours as needed for nausea.   10 tablet   0   . spironolactone (ALDACTONE) 25 MG tablet   Oral   Take 25 mg by mouth daily.         . traZODone (DESYREL) 50 MG tablet   Oral   Take 50 mg by mouth at bedtime.         . valACYclovir (VALTREX) 500 MG tablet               . verapamil (VERELAN PM) 240 MG 24 hr capsule   Oral   Take 240 mg by mouth at bedtime.           Brigitte Pulse SR 100 MG 12 hr tablet   Oral   Take 1 tablet by mouth daily.         Marland Kitchen HYDROcodone-acetaminophen (NORCO/VICODIN) 5-325 MG per tablet   Oral   Take 2 tablets by mouth 2 (two) times daily as needed for pain.   40 tablet   0    BP 131/96  Pulse 97  Temp(Src) 98.3 F (36.8 C) (Oral)  Resp 18  Ht 5\' 3"  (1.6 m)  Wt 210 lb (95.255 kg)  BMI 37.21 kg/m2  SpO2 100%  LMP 03/01/2013 Physical Exam  Nursing note and vitals reviewed. Constitutional: She is oriented to person, place, and time. She appears well-developed and well-nourished.  HENT:  Head: Normocephalic.    Right Ear: Tympanic membrane and external ear normal.  Left Ear: Tympanic membrane and external ear normal.  Nose: No sinus tenderness. No epistaxis.  Mouth/Throat: Uvula is midline, oropharynx is clear and moist and mucous membranes are normal.  Eyes: Conjunctivae and EOM are normal. Pupils are equal, round, and reactive to light.  Neck: Normal range of motion. Neck supple.  Cardiovascular:  Normal rate and regular rhythm.   Pulmonary/Chest: Effort normal and breath sounds normal.  Abdominal: Soft. There is no tenderness.  Musculoskeletal: Normal range of motion.  Neurological: She is alert and oriented to person, place, and time. She has normal strength and normal reflexes. No cranial  nerve deficit or sensory deficit. She displays a negative Romberg sign. Coordination and gait normal.  Skin: Skin is warm and dry.  Psychiatric: She has a normal mood and affect.   Ct Head Wo Contrast  03/28/2013   CLINICAL DATA:  Syncope and fall 4 days ago; hit head. Headache and nausea.  EXAM: CT HEAD WITHOUT CONTRAST  TECHNIQUE: Contiguous axial images were obtained from the base of the skull through the vertex without intravenous contrast.  COMPARISON:  None.  FINDINGS: There is no evidence of acute infarction, mass lesion, or intra- or extra-axial hemorrhage on CT.  The posterior fossa, including the cerebellum, brainstem and fourth ventricle, is within normal limits. The third and lateral ventricles, and basal ganglia are unremarkable in appearance. The cerebral hemispheres are symmetric in appearance, with normal gray-white differentiation. No mass effect or midline shift is seen.  There is no evidence of fracture; visualized osseous structures are unremarkable in appearance. The orbits are within normal limits. The paranasal sinuses and mastoid air cells are well-aerated. No significant soft tissue abnormalities are seen.  IMPRESSION: No evidence of traumatic intracranial injury or fracture.   Electronically Signed   By: Roanna Raider M.D.   On: 03/28/2013 23:48    ED Course  Procedures    MDM: Dr. Fayrene Fearing in to speak with the patient and discuss plan of care.     42 y.o. female with continued headache s/p fall where she hit her head four days ago. She is stable for discharge without any immediate complications. She has a normal neuro exam. She will continue her medications from her PCP as needed and follow up there as needed. Discussed with the patient clinical and CT finding. She will return if any problems arise.  Patient now requesting letter to her school stating she was here for CT scan and that she has a concussion. Letter given stating she was here and had CT scan. Dr. Fayrene Fearing discussed  with patient with normal neuro exam and normal CT scan we can not states that she has a concussion. She may contact her PCP for a letter if she felt the patient had a concussion at the time of the injury.   [facility] Orlene Och, [place] 03/29/13 [phone]

## 2013-04-01 DIAGNOSIS — F319 Bipolar disorder, unspecified: Secondary | ICD-10-CM | POA: Diagnosis not present

## 2013-04-02 ENCOUNTER — Encounter: Payer: Self-pay | Admitting: Family Medicine

## 2013-04-02 ENCOUNTER — Ambulatory Visit: Payer: 59 | Admitting: Family Medicine

## 2013-04-02 ENCOUNTER — Ambulatory Visit (INDEPENDENT_AMBULATORY_CARE_PROVIDER_SITE_OTHER): Payer: 59 | Admitting: Family Medicine

## 2013-04-02 VITALS — BP 122/83 | HR 96 | Temp 97.8°F | Wt 209.0 lb

## 2013-04-02 DIAGNOSIS — F319 Bipolar disorder, unspecified: Secondary | ICD-10-CM | POA: Diagnosis not present

## 2013-04-02 DIAGNOSIS — Z5189 Encounter for other specified aftercare: Secondary | ICD-10-CM

## 2013-04-02 DIAGNOSIS — S060X0D Concussion without loss of consciousness, subsequent encounter: Secondary | ICD-10-CM

## 2013-04-02 NOTE — Progress Notes (Signed)
   Subjective:    Patient ID: Diane [name], female    DOB: 1970/10/02, 42 y.o.   MRN: [id number]  HPI Went to [facility] ED over the weekend  bc the headaches were getting worse.  She did stop the hydrocodone.  Today HA is 7/10.  Mostly over the crown of the head. Constant and dull right now.  Has used some aleve and not really helping.  Still not really sure what caused the initial fall. She had been repetitively bending and stooping goal being larger he and then started walking and got lightheaded and fell. She has not had any more lightheaded episode since then. She admits she has not been on complete mental rest. She's had multiple doctors appointments this week and has been on the computer some.  She is in a mixed state with her Bipolar d/o. She had to call her psychiatrist for an emergency visit over the weekend and in fact has a followup tomorrow. He plans on adjusting some of her medications. He did stop her Wellbutrin and her phentermine. Review of Systems     Objective:   Physical Exam  Constitutional: She is oriented to person, place, and time. She appears well-developed and well-nourished.  Neurological: She is alert and oriented to person, place, and time. She has normal reflexes. She displays normal reflexes. No cranial nerve deficit. She exhibits normal muscle tone. Coordination normal.  Psychiatric: She has a normal mood and affect. Her behavior is normal.          Assessment & Plan:  Concussion - continue mental rest.  She spent multiple doctors office visits this week and has been crying a lot because of her shifts in mood and she says when she cries it aggravates her headache. Encouraged her to use narcotic pain medication sparingly. She uses it too frequently it can cause rebound headaches. She says she understands. She could certainly try Tylenol if she would like to. Recommend complete mental rest. Followup in one week. Again reminded her that this includes TV, computer,  cell phone, testing et Karie Soda.  Bipolar disorder-she's working very closely with her psychiatrist on this. In fact she has a followup appointment tomorrow. He has held her Wellbutrin and phentermine for now. She is concerned about gaining weight. Explained her that we can readdress this when she is better and she is able to get back into exercise in addition to weight loss medication. In fact her psychiatrist and I can communicate about what might be the best option in the future to help her with weight loss.  Has questions about her lasix.  Encouraged her to decrease to every other day.  Discussed prefer prn use.

## 2013-04-03 DIAGNOSIS — IMO0001 Reserved for inherently not codable concepts without codable children: Secondary | ICD-10-CM | POA: Diagnosis not present

## 2013-04-03 DIAGNOSIS — M25539 Pain in unspecified wrist: Secondary | ICD-10-CM | POA: Insufficient documentation

## 2013-04-03 DIAGNOSIS — M25569 Pain in unspecified knee: Secondary | ICD-10-CM | POA: Diagnosis not present

## 2013-04-03 DIAGNOSIS — M6580 Other synovitis and tenosynovitis, unspecified site: Secondary | ICD-10-CM | POA: Diagnosis not present

## 2013-04-08 NOTE — ED Provider Notes (Signed)
Medical screening examination/treatment/procedure(s) were performed by non-physician practitioner and as supervising physician I was immediately available for consultation/collaboration.  EKG Interpretation   None         Kaheem Halleck J Antolin Belsito, MD 04/08/13 0100 

## 2013-04-09 ENCOUNTER — Encounter: Payer: Self-pay | Admitting: Family Medicine

## 2013-04-09 ENCOUNTER — Ambulatory Visit (INDEPENDENT_AMBULATORY_CARE_PROVIDER_SITE_OTHER): Payer: 59 | Admitting: Family Medicine

## 2013-04-09 VITALS — BP 126/84 | HR 99 | Temp 97.9°F | Wt 208.0 lb

## 2013-04-09 DIAGNOSIS — K589 Irritable bowel syndrome without diarrhea: Secondary | ICD-10-CM

## 2013-04-09 DIAGNOSIS — F319 Bipolar disorder, unspecified: Secondary | ICD-10-CM

## 2013-04-09 DIAGNOSIS — M65839 Other synovitis and tenosynovitis, unspecified forearm: Secondary | ICD-10-CM

## 2013-04-09 DIAGNOSIS — S060X9D Concussion with loss of consciousness of unspecified duration, subsequent encounter: Secondary | ICD-10-CM

## 2013-04-09 DIAGNOSIS — M778 Other enthesopathies, not elsewhere classified: Secondary | ICD-10-CM

## 2013-04-09 MED ORDER — HYOSCYAMINE SULFATE ER 0.375 MG PO TB12: 0.3750 mg | ORAL_TABLET | Freq: Two times a day (BID) | ORAL | Status: DC | PRN

## 2013-04-09 NOTE — Progress Notes (Signed)
   Subjective:    Patient ID: Diane [name], female    DOB: 10-29-1970, 42 y.o.   MRN: [id number]  HPI F/u concussion - Headaches are overall better. She is feeling much better. Feels she is focusing and processing better. No N/V.  She did try to play Monopoly last night and overall did well. She still felt a little confused when trying to add numbers together but says it did not make her headache worse. She did receive a notification from her school that because she had this for many days she would have to drop out of this semester and start over next semester. They said they were going to contact me but I have not heard from them.  Has been having some problems controlling her bipolar. She was started on lithium about a week ago. So far she's tolerating it well but is still getting adjusted to it.  Says her IBS has been flaring.She is normall constipated but now has had diarrhea.  She Use to be on a true clean and it worked really well for controlling her IBS. It was stopped her psychiatrist because it was interfering with some of her medications.  She also asked if we could provide her with a wrist splint for her right wrist. She did hurt her wrist when she fell. She had her rheumatologist look at it and they felt that she had a little bit of tendinitis or sprain from the fall. She has some discomfort with full flexion and extension against resistance. A little bit of tenderness over the wrist posteriorly. No swelling.  Review of Systems     Objective:   Physical Exam  Constitutional: She is oriented to person, place, and time. She appears well-developed and well-nourished.  HENT:  Head: Normocephalic and atraumatic.  Cardiovascular: Normal rate, regular rhythm and normal heart sounds.   Pulmonary/Chest: Effort normal and breath sounds normal.  Musculoskeletal:  Right wrist with normal range of motion. She did have some pain in the anterior wrist with flexion against resistance and  some pain in the posterior wrist with extension against resistance. Some mild tenderness over the posterior wrist. No pinpoint bony tenderness. No swelling or tenderness over the forearm. Hand strength and finger strength and range of motion was normal.  Neurological: She is alert and oriented to person, place, and time. She has normal reflexes. No cranial nerve deficit. She exhibits normal muscle tone.  Skin: Skin is warm and dry.  Psychiatric: She has a normal mood and affect. Her behavior is normal.          Assessment & Plan:  IBS-gave her a small quantity of Levbid to use as needed for bowel spasms and diarrhea. As soon as the diarrhea improved she is to stop the medication. It is written for as needed use. She is normally more constipation predominant.  Bipolar disorder-recently started on lithium.  Concussion-Much improved, but not resolved. Continue mental rest and gradually increase activity as tolerated as long as it's not making her headache worse. Hopefully over the next 2-4 weeks she should be close to baseline. If over the next month she's not continuing to improve then please followup with me.  Right wrist tendinitis-given cockup wrist splint for the right hand. Encourage her to wear this for one week. 2 weeks maximum. Work on gentle range of motion stretches. If not improving in the next one to weeks then please followup.

## 2013-04-10 DIAGNOSIS — F309 Manic episode, unspecified: Secondary | ICD-10-CM | POA: Diagnosis not present

## 2013-04-13 DIAGNOSIS — K5900 Constipation, unspecified: Secondary | ICD-10-CM | POA: Diagnosis not present

## 2013-04-13 DIAGNOSIS — K219 Gastro-esophageal reflux disease without esophagitis: Secondary | ICD-10-CM | POA: Diagnosis not present

## 2013-04-23 ENCOUNTER — Other Ambulatory Visit: Payer: Self-pay | Admitting: Family Medicine

## 2013-04-24 ENCOUNTER — Ambulatory Visit: Payer: 59 | Admitting: Physician Assistant

## 2013-04-29 ENCOUNTER — Ambulatory Visit: Payer: 59 | Admitting: Physician Assistant

## 2013-04-30 DIAGNOSIS — F319 Bipolar disorder, unspecified: Secondary | ICD-10-CM | POA: Diagnosis not present

## 2013-05-04 ENCOUNTER — Encounter: Payer: Self-pay | Admitting: Family Medicine

## 2013-05-04 ENCOUNTER — Ambulatory Visit (INDEPENDENT_AMBULATORY_CARE_PROVIDER_SITE_OTHER): Payer: 59 | Admitting: Family Medicine

## 2013-05-04 VITALS — BP 126/88 | HR 99 | Temp 98.0°F | Ht 63.0 in | Wt 210.0 lb

## 2013-05-04 DIAGNOSIS — R599 Enlarged lymph nodes, unspecified: Secondary | ICD-10-CM

## 2013-05-04 DIAGNOSIS — R591 Generalized enlarged lymph nodes: Secondary | ICD-10-CM

## 2013-05-04 NOTE — Progress Notes (Signed)
   Subjective:    Patient ID: Diane [name], female    DOB: 05/26/1970, 43 y.o.   MRN: [id number]  HPI Here for swollen lymph nodes. She complains of pain in her left anterior cervical lymph node on and off for the last couple weeks. She said one day she just noticed it felt very sore and tender. She does not really trauma or injury to the neck. She has not had a persistent sore throat. No tear pain or pressure. She has not had any dental pain. No fevers chills or sweats. She is once in a while if not getting better Still sore.   Review of Systems     Objective:   Physical Exam  Constitutional: She is oriented to person, place, and time. She appears well-developed and well-nourished.  HENT:  Head: Normocephalic and atraumatic.  Right Ear: External ear normal.  Left Ear: External ear normal.  Nose: Nose normal.  Mouth/Throat: Oropharynx is clear and moist.  TMs and canals are clear.   Eyes: Conjunctivae and EOM are normal. Pupils are equal, round, and reactive to light.  Neck: Neck supple. No thyromegaly present.  Bilat ant cer LN. Tender on the left.  Nontender on the right.   Cardiovascular: Normal rate, regular rhythm and normal heart sounds.   Pulmonary/Chest: Effort normal and breath sounds normal. She has no wheezes.  Lymphadenopathy:    She has cervical adenopathy.  Neurological: She is alert and oriented to person, place, and time.  Skin: Skin is warm and dry.  Psychiatric: She has a normal mood and affect.          Assessment & Plan:  Anterior cervical lymphadenopathy-she does have bilateral mildly enlarged cervical lymph nodes. The left is tender the right is not. She's not had any fevers chills or sweats to indicate actual infection of the lip gland itself. We'll check a CBC with differential. If it's in the normal range we will continue to give this 2-3 weeks to resolve on its own. When her that sometimes lymph nodes can take 6-8 weeks to completely resolve  once they become inflamed. Recommend Aleve or ibuprofen for acute pain and inflammation. If the white count is elevated then we will consider a trial of antibiotics for treatment.

## 2013-05-05 LAB — CBC WITH DIFFERENTIAL/PLATELET
BASOS PCT: 0 % (ref 0–1)
Basophils Absolute: 0 10*3/uL (ref 0.0–0.1)
EOS ABS: 0.1 10*3/uL (ref 0.0–0.7)
EOS PCT: 1 % (ref 0–5)
HEMATOCRIT: 41.3 % (ref 36.0–46.0)
Hemoglobin: 13.8 g/dL (ref 12.0–15.0)
LYMPHS ABS: 2.9 10*3/uL (ref 0.7–4.0)
Lymphocytes Relative: 28 % (ref 12–46)
MCH: 27.3 pg (ref 26.0–34.0)
MCHC: 33.4 g/dL (ref 30.0–36.0)
MCV: 81.8 fL (ref 78.0–100.0)
MONO ABS: 0.6 10*3/uL (ref 0.1–1.0)
MONOS PCT: 6 % (ref 3–12)
NEUTROS PCT: 65 % (ref 43–77)
Neutro Abs: 6.6 10*3/uL (ref 1.7–7.7)
Platelets: 311 10*3/uL (ref 150–400)
RBC: 5.05 MIL/uL (ref 3.87–5.11)
RDW: 15 % (ref 11.5–15.5)
WBC: 10.2 10*3/uL (ref 4.0–10.5)

## 2013-05-06 ENCOUNTER — Ambulatory Visit (INDEPENDENT_AMBULATORY_CARE_PROVIDER_SITE_OTHER): Payer: 59 | Admitting: Physician Assistant

## 2013-05-06 ENCOUNTER — Encounter: Payer: Self-pay | Admitting: Physician Assistant

## 2013-05-06 VITALS — BP 126/78 | HR 82 | Wt 213.0 lb

## 2013-05-06 DIAGNOSIS — A088 Other specified intestinal infections: Secondary | ICD-10-CM

## 2013-05-06 DIAGNOSIS — A084 Viral intestinal infection, unspecified: Secondary | ICD-10-CM

## 2013-05-06 DIAGNOSIS — F319 Bipolar disorder, unspecified: Secondary | ICD-10-CM | POA: Diagnosis not present

## 2013-05-06 NOTE — Progress Notes (Signed)
   Subjective:    Patient ID: Diane [name], female    DOB: 02-02-1971, 43 y.o.   MRN: [id number]  HPI Patient presents to the clinic with abdominal pain and diarrhea that started yesterday morning. She has not started any new medications and has not eaten anything new or raw. She reports approximately 5-7 loose stools today. She denies any blood or mucus in stools. She does feel nauseated but denies any vomiting. She has tried to keep today and gone straight through. She has not tried anything over-the-counter. Her abdominal pain is generalized over entire abdomen. She did try her height is the main and it did not seem to help or make worse. She denies any other sick contacts. Denies any fever, chills, upper respiratory symptoms. No body aches.    Review of Systems     Objective:   Physical Exam  Constitutional: She is oriented to person, place, and time. She appears well-developed and well-nourished.  HENT:  Head: Normocephalic and atraumatic.  Right Ear: External ear normal.  Left Ear: External ear normal.  Nose: Nose normal.  Mouth/Throat: Oropharynx is clear and moist.  Eyes: Conjunctivae are normal.  Neck: Normal range of motion. Neck supple.  Cardiovascular: Normal rate, regular rhythm and normal heart sounds.   Pulmonary/Chest: Effort normal and breath sounds normal.  Abdominal: Soft. Bowel sounds are normal.  Mild tenderness over general abdomen. No guarding.  Lymphadenopathy:    She has no cervical adenopathy.  Neurological: She is alert and oriented to person, place, and time.  Skin: Skin is warm and dry.  Psychiatric: She has a normal mood and affect. Her behavior is normal.          Assessment & Plan:  Viral gastroenteritis-discuss with patient diagnosis. Patient aware needs to run its course. Gave handout for symptomatic care. Encouraged patient to use brat diet and to advance foods slowly. Offered medication for nausea patient declined at this time. If  diarrhea persists then after 48 hours consider Imodium. Continue to stay hydrated. Call if symptoms suddenly worsening.

## 2013-05-06 NOTE — Patient Instructions (Signed)
Immodium OTC with diarrhea.   Diet for Diarrhea, Adult Frequent, runny stools (diarrhea) may be caused or worsened by food or drink. Diarrhea may be relieved by changing your diet. Since diarrhea can last up to 7 days, it is easy for you to lose too much fluid from the body and become dehydrated. Fluids that are lost need to be replaced. Along with a modified diet, make sure you drink enough fluids to keep your urine clear or pale yellow. DIET INSTRUCTIONS  Ensure adequate fluid intake (hydration): have 1 cup (8 oz) of fluid for each diarrhea episode. Avoid fluids that contain simple sugars or sports drinks, fruit juices, whole milk products, and sodas. Your urine should be clear or pale yellow if you are drinking enough fluids. Hydrate with an oral rehydration solution that you can purchase at pharmacies, retail stores, and online. You can prepare an oral rehydration solution at home by mixing the following ingredients together:    tsp table salt.   tsp baking soda.   tsp salt substitute containing potassium chloride.  1  tablespoons sugar.  1 L (34 oz) of water.  Certain foods and beverages may increase the speed at which food moves through the gastrointestinal (GI) tract. These foods and beverages should be avoided and include:  Caffeinated and alcoholic beverages.  High-fiber foods, such as raw fruits and vegetables, nuts, seeds, and whole grain breads and cereals.  Foods and beverages sweetened with sugar alcohols, such as xylitol, sorbitol, and mannitol.  Some foods may be well tolerated and may help thicken stool including:  Starchy foods, such as rice, toast, pasta, low-sugar cereal, oatmeal, grits, baked potatoes, crackers, and bagels.   Bananas.   Applesauce.  Add probiotic-rich foods to help increase healthy bacteria in the GI tract, such as yogurt and fermented milk products. RECOMMENDED FOODS AND BEVERAGES Starches Choose foods with less than 2 g of fiber per  serving.  Recommended:  White, [place], and pita breads, plain rolls, buns, bagels. Plain muffins, matzo. Soda, saltine, or graham crackers. Pretzels, melba toast, zwieback. Cooked cereals made with water: cornmeal, farina, cream cereals. Dry cereals: refined corn, wheat, rice. Potatoes prepared any way without skins, refined macaroni, spaghetti, noodles, refined rice.  Avoid:  Bread, rolls, or crackers made with whole wheat, multi-grains, rye, bran seeds, nuts, or coconut. Corn tortillas or taco shells. Cereals containing whole grains, multi-grains, bran, coconut, nuts, raisins. Cooked or dry oatmeal. Coarse wheat cereals, granola. Cereals advertised as "high-fiber." Potato skins. Whole grain pasta, wild or brown rice. Popcorn. Sweet potatoes, yams. Sweet rolls, doughnuts, waffles, pancakes, sweet breads. Vegetables  Recommended: Strained tomato and vegetable juices. Most well-cooked and canned vegetables without seeds. Fresh: Tender lettuce, cucumber without the skin, cabbage, spinach, bean sprouts.  Avoid: Fresh, cooked, or canned: Artichokes, baked beans, beet greens, broccoli, Brussels sprouts, corn, kale, legumes, peas, sweet potatoes. Cooked: Green or red cabbage, spinach. Avoid large servings of any vegetables because vegetables shrink when cooked, and they contain more fiber per serving than fresh vegetables. Fruit  Recommended: Cooked or canned: Apricots, applesauce, cantaloupe, cherries, fruit cocktail, grapefruit, grapes, kiwi, mandarin oranges, peaches, pears, plums, watermelon. Fresh: Apples without skin, ripe banana, grapes, cantaloupe, cherries, grapefruit, peaches, oranges, plums. Keep servings limited to  cup or 1 piece.  Avoid: Fresh: Apples with skin, apricots, mangoes, pears, raspberries, strawberries. Prune juice, stewed or dried prunes. Dried fruits, raisins, dates. Large servings of all fresh fruits. Protein  Recommended: Ground or well-cooked tender beef, ham, veal, lamb,  pork,  or poultry. Eggs. Fish, oysters, shrimp, lobster, other seafoods. Liver, organ meats.  Avoid: Tough, fibrous meats with gristle. Peanut butter, smooth or chunky. Cheese, nuts, seeds, legumes, dried peas, beans, lentils. Dairy  Recommended: Yogurt, lactose-free milk, kefir, drinkable yogurt, buttermilk, soy milk, or plain hard cheese.  Avoid: Milk, chocolate milk, beverages made with milk, such as milkshakes. Soups  Recommended: Bouillon, broth, or soups made from allowed foods. Any strained soup.  Avoid: Soups made from vegetables that are not allowed, cream or milk-based soups. Desserts and Sweets  Recommended: Sugar-free gelatin, sugar-free frozen ice pops made without sugar alcohol.  Avoid: Plain cakes and cookies, pie made with fruit, pudding, custard, cream pie. Gelatin, fruit, ice, sherbet, frozen ice pops. Ice cream, ice milk without nuts. Plain hard candy, honey, jelly, molasses, syrup, sugar, chocolate syrup, gumdrops, marshmallows. Fats and Oils  Recommended: Limit fats to less than 8 tsp per day.  Avoid: Seeds, nuts, olives, avocados. Margarine, butter, cream, mayonnaise, salad oils, plain salad dressings. Plain gravy, crisp bacon without rind. Beverages  Recommended: Water, decaffeinated teas, oral rehydration solutions, sugar-free beverages not sweetened with sugar alcohols.  Avoid: Fruit juices, caffeinated beverages (coffee, tea, soda), alcohol, sports drinks, or lemon-lime soda. Condiments  Recommended: Ketchup, mustard, horseradish, vinegar, cocoa powder. Spices in moderation: allspice, basil, bay leaves, celery powder or leaves, cinnamon, cumin powder, curry powder, ginger, mace, marjoram, onion or garlic powder, oregano, paprika, parsley flakes, ground pepper, rosemary, sage, savory, tarragon, thyme, turmeric.  Avoid: Coconut, honey. Document Released: 06/30/2003 Document Revised: 01/02/2012 Document Reviewed: 08/24/2011 [facility] Patient Information 2014  [place].   Viral Gastroenteritis Viral gastroenteritis is also known as stomach flu. This condition affects the stomach and intestinal tract. It can cause sudden diarrhea and vomiting. The illness typically lasts 3 to 8 days. Most people develop an immune response that eventually gets rid of the virus. While this natural response develops, the virus can make you quite ill. CAUSES  Many different viruses can cause gastroenteritis, such as rotavirus or noroviruses. You can catch one of these viruses by consuming contaminated food or water. You may also catch a virus by sharing utensils or other personal items with an infected person or by touching a contaminated surface. SYMPTOMS  The most common symptoms are diarrhea and vomiting. These problems can cause a severe loss of body fluids (dehydration) and a body salt (electrolyte) imbalance. Other symptoms may include:  Fever.  Headache.  Fatigue.  Abdominal pain. DIAGNOSIS  Your caregiver can usually diagnose viral gastroenteritis based on your symptoms and a physical exam. A stool sample may also be taken to test for the presence of viruses or other infections. TREATMENT  This illness typically goes away on its own. Treatments are aimed at rehydration. The most serious cases of viral gastroenteritis involve vomiting so severely that you are not able to keep fluids down. In these cases, fluids must be given through an intravenous line (IV). HOME CARE INSTRUCTIONS   Drink enough fluids to keep your urine clear or pale yellow. Drink small amounts of fluids frequently and increase the amounts as tolerated.  Ask your caregiver for specific rehydration instructions.  Avoid:  Foods high in sugar.  Alcohol.  Carbonated drinks.  Tobacco.  Juice.  Caffeine drinks.  Extremely hot or cold fluids.  Fatty, greasy foods.  Too much intake of anything at one time.  Dairy products until 24 to 48 hours after diarrhea stops.  You may  consume probiotics. Probiotics are active cultures of beneficial bacteria.  They may lessen the amount and number of diarrheal stools in adults. Probiotics can be found in yogurt with active cultures and in supplements.  Wash your hands well to avoid spreading the virus.  Only take over-the-counter or prescription medicines for pain, discomfort, or fever as directed by your caregiver. Do not give aspirin to children. Antidiarrheal medicines are not recommended.  Ask your caregiver if you should continue to take your regular prescribed and over-the-counter medicines.  Keep all follow-up appointments as directed by your caregiver. SEEK IMMEDIATE MEDICAL CARE IF:   You are unable to keep fluids down.  You do not urinate at least once every 6 to 8 hours.  You develop shortness of breath.  You notice blood in your stool or vomit. This may look like coffee grounds.  You have abdominal pain that increases or is concentrated in one small area (localized).  You have persistent vomiting or diarrhea.  You have a fever.  The patient is a child younger than 3 months, and he or she has a fever.  The patient is a child older than 3 months, and he or she has a fever and persistent symptoms.  The patient is a child older than 3 months, and he or she has a fever and symptoms suddenly get worse.  The patient is a baby, and he or she has no tears when crying. MAKE SURE YOU:   Understand these instructions.  Will watch your condition.  Will get help right away if you are not doing well or get worse. Document Released: 04/09/2005 Document Revised: 07/02/2011 Document Reviewed: 01/24/2011 [facility] Patient Information 2014 [place].

## 2013-05-14 DIAGNOSIS — F319 Bipolar disorder, unspecified: Secondary | ICD-10-CM | POA: Diagnosis not present

## 2013-05-21 DIAGNOSIS — M7710 Lateral epicondylitis, unspecified elbow: Secondary | ICD-10-CM | POA: Diagnosis not present

## 2013-05-21 DIAGNOSIS — F319 Bipolar disorder, unspecified: Secondary | ICD-10-CM | POA: Diagnosis not present

## 2013-05-21 DIAGNOSIS — M719 Bursopathy, unspecified: Secondary | ICD-10-CM | POA: Diagnosis not present

## 2013-05-21 DIAGNOSIS — M7550 Bursitis of unspecified shoulder: Secondary | ICD-10-CM | POA: Insufficient documentation

## 2013-05-21 DIAGNOSIS — M67919 Unspecified disorder of synovium and tendon, unspecified shoulder: Secondary | ICD-10-CM | POA: Diagnosis not present

## 2013-05-21 DIAGNOSIS — M25569 Pain in unspecified knee: Secondary | ICD-10-CM | POA: Diagnosis not present

## 2013-05-21 DIAGNOSIS — D6859 Other primary thrombophilia: Secondary | ICD-10-CM | POA: Diagnosis not present

## 2013-05-25 ENCOUNTER — Other Ambulatory Visit: Payer: Self-pay | Admitting: Family Medicine

## 2013-05-29 DIAGNOSIS — E282 Polycystic ovarian syndrome: Secondary | ICD-10-CM | POA: Diagnosis not present

## 2013-05-29 DIAGNOSIS — D6859 Other primary thrombophilia: Secondary | ICD-10-CM | POA: Diagnosis not present

## 2013-05-29 DIAGNOSIS — K5730 Diverticulosis of large intestine without perforation or abscess without bleeding: Secondary | ICD-10-CM | POA: Diagnosis not present

## 2013-05-29 DIAGNOSIS — R635 Abnormal weight gain: Secondary | ICD-10-CM | POA: Diagnosis not present

## 2013-05-29 DIAGNOSIS — E039 Hypothyroidism, unspecified: Secondary | ICD-10-CM | POA: Diagnosis not present

## 2013-05-29 DIAGNOSIS — G43909 Migraine, unspecified, not intractable, without status migrainosus: Secondary | ICD-10-CM | POA: Diagnosis not present

## 2013-05-29 DIAGNOSIS — F309 Manic episode, unspecified: Secondary | ICD-10-CM | POA: Diagnosis not present

## 2013-06-17 DIAGNOSIS — M359 Systemic involvement of connective tissue, unspecified: Secondary | ICD-10-CM | POA: Diagnosis not present

## 2013-06-17 DIAGNOSIS — M2550 Pain in unspecified joint: Secondary | ICD-10-CM | POA: Diagnosis not present

## 2013-06-17 DIAGNOSIS — D6859 Other primary thrombophilia: Secondary | ICD-10-CM | POA: Diagnosis not present

## 2013-06-30 ENCOUNTER — Telehealth: Payer: Self-pay | Admitting: Family Medicine

## 2013-06-30 MED ORDER — ONDANSETRON 8 MG PO TBDP: 8.0000 mg | ORAL_TABLET | Freq: Three times a day (TID) | ORAL | Status: DC | PRN

## 2013-06-30 MED ORDER — HYDROCODONE-ACETAMINOPHEN 5-325 MG PO TABS: 2.0000 | ORAL_TABLET | Freq: Two times a day (BID) | ORAL | Status: DC | PRN

## 2013-06-30 NOTE — Telephone Encounter (Signed)
Pt informed of rx via vm.Diane [name]

## 2013-06-30 NOTE — Telephone Encounter (Signed)
Pt called and she would like refills on Hydrocodone and Ondestron(works better that Promethazine).

## 2013-07-21 ENCOUNTER — Ambulatory Visit (INDEPENDENT_AMBULATORY_CARE_PROVIDER_SITE_OTHER): Payer: 59 | Admitting: Family Medicine

## 2013-07-21 ENCOUNTER — Encounter: Payer: Self-pay | Admitting: Family Medicine

## 2013-07-21 VITALS — BP 120/85 | HR 105 | Ht 63.0 in | Wt 217.0 lb

## 2013-07-21 DIAGNOSIS — I10 Essential (primary) hypertension: Secondary | ICD-10-CM

## 2013-07-21 DIAGNOSIS — G43009 Migraine without aura, not intractable, without status migrainosus: Secondary | ICD-10-CM | POA: Diagnosis not present

## 2013-07-21 DIAGNOSIS — F319 Bipolar disorder, unspecified: Secondary | ICD-10-CM

## 2013-07-21 MED ORDER — PROMETHAZINE HCL 25 MG/ML IJ SOLN
25.0000 mg | Freq: Once | INTRAMUSCULAR | Status: AC
  Administered 2013-07-21: 25 mg via INTRAMUSCULAR

## 2013-07-21 MED ORDER — HYDROCODONE-ACETAMINOPHEN 5-325 MG PO TABS: 2.0000 | ORAL_TABLET | Freq: Two times a day (BID) | ORAL | Status: DC | PRN

## 2013-07-21 MED ORDER — ZOLMITRIPTAN 2.5 MG NA SOLN: 1.0000 | Freq: Every day | NASAL | Status: DC | PRN

## 2013-07-21 NOTE — Addendum Note (Signed)
Addended by: Teddy Spike on: 07/21/2013 05:33 PM   Modules accepted: Orders

## 2013-07-21 NOTE — Progress Notes (Signed)
   Subjective:    Patient ID: Diane [name], female    DOB: July 12, 1970, 43 y.o.   MRN: [id number]  HPI IBS - Says her stress levels are up and has been flaring her IBS.  Feels like the Levbid does helps.  Her Bipolar meds have caused her to gain weight.  On tegretol now.  She is now on Tegretol through her psych. She is using vistaril 50mg  for sleep.   Has been having persistant headaches. Getting nauseated with it. relpax make her vomit.  zomig did well, but wasn't always consistent. She cannot remember if she tried Imitrex or not.  She's been under a lot of stress over the last several months and notices has been triggering her headaches. They also been making a lot of changes to her psychiatric medications about this could be triggering her headaches as well. Most of the pain is on the top of her head and she gets very nauseated with it and has light sensitivity.    Hypertension- Pt denies chest pain, SOB, dizziness, or heart palpitations.  Taking meds as directed w/o problems.  Denies medication side effects.     Review of Systems     Objective:   Physical Exam  Constitutional: She is oriented to person, place, and time. She appears well-developed and well-nourished.  HENT:  Head: Normocephalic and atraumatic.  Cardiovascular: Normal rate, regular rhythm and normal heart sounds.   Pulmonary/Chest: Effort normal and breath sounds normal.  Neurological: She is alert and oriented to person, place, and time.  Skin: Skin is warm and dry.  Psychiatric: She has a normal mood and affect. Her behavior is normal.          Assessment & Plan:  IBS - and back she felt like she was going to vomit here and had diarrhea. We would have to give her a shot of Phenergan for acute relief. Did not have anything to give her for the diarrhea. She is doing well with the [place]kon over all. If she's not taking it consistently. If she needs a refill then please let me know.   Weight gain  - Recommend  Belviq, since she did not tolerate a stimulant well. I'll need to make sure that it will do okay with her other medications and will likely require prior authorization with her insurance. She understands that this would be part of the diet and exercise regimen.  Attention-well-controlled. Continue current regimen.  Bipolar disorder mixed state-now on Tegretol. Hopefully this will work well for her. She's followed very carefully by psychiatry. Unfortunately there her psychiatrist no U. her healthcare so she may be in a binder she is going to need to look for a new psychiatrist.

## 2013-07-23 ENCOUNTER — Telehealth: Payer: Self-pay | Admitting: *Deleted

## 2013-07-23 DIAGNOSIS — F319 Bipolar disorder, unspecified: Secondary | ICD-10-CM | POA: Diagnosis not present

## 2013-07-23 NOTE — Telephone Encounter (Signed)
Pt called and stated that zomig is on backorder at [facility]. She was told that there was a problem w/the manufacture getting this in. She would like to have something else sent in that she doesn't have to take orally. Maryruth Eve, [place] Crocker

## 2013-07-26 NOTE — Telephone Encounter (Addendum)
Does she want to try Sumavel? It is a shot.

## 2013-07-27 NOTE — Telephone Encounter (Signed)
Pt stated that [facility] told her that the [facility] which she has used in the past is also on back order.Diane [name]]

## 2013-07-27 NOTE — Telephone Encounter (Signed)
Pt called back and stated that [facility] does have zomig in stock.Audelia Hives [place]

## 2013-07-29 ENCOUNTER — Telehealth: Payer: Self-pay | Admitting: *Deleted

## 2013-07-29 DIAGNOSIS — Z888 Allergy status to other drugs, medicaments and biological substances status: Secondary | ICD-10-CM | POA: Diagnosis not present

## 2013-07-29 DIAGNOSIS — R51 Headache: Secondary | ICD-10-CM | POA: Diagnosis not present

## 2013-07-29 DIAGNOSIS — G8929 Other chronic pain: Secondary | ICD-10-CM | POA: Diagnosis not present

## 2013-07-29 DIAGNOSIS — G43909 Migraine, unspecified, not intractable, without status migrainosus: Secondary | ICD-10-CM

## 2013-07-29 DIAGNOSIS — Z7982 Long term (current) use of aspirin: Secondary | ICD-10-CM | POA: Diagnosis not present

## 2013-07-29 DIAGNOSIS — I10 Essential (primary) hypertension: Secondary | ICD-10-CM | POA: Diagnosis not present

## 2013-07-29 DIAGNOSIS — Z79899 Other long term (current) drug therapy: Secondary | ICD-10-CM | POA: Diagnosis not present

## 2013-07-29 DIAGNOSIS — M329 Systemic lupus erythematosus, unspecified: Secondary | ICD-10-CM | POA: Diagnosis not present

## 2013-07-29 NOTE — Telephone Encounter (Signed)
Pt called and stated that she went to the ED for migraine last night and was treated and released. She is still not feeling well she said the zomig is not working for her and is asking that a referral be placed for her to be seen by her neurologist Dr. Maurice Small.   Called pt back and lvm for return call.Diane [name]

## 2013-08-04 ENCOUNTER — Ambulatory Visit: Payer: 59 | Admitting: Family Medicine

## 2013-08-05 DIAGNOSIS — G43709 Chronic migraine without aura, not intractable, without status migrainosus: Secondary | ICD-10-CM | POA: Diagnosis not present

## 2013-08-05 DIAGNOSIS — R51 Headache: Secondary | ICD-10-CM | POA: Diagnosis not present

## 2013-08-06 DIAGNOSIS — Z7189 Other specified counseling: Secondary | ICD-10-CM | POA: Diagnosis not present

## 2013-08-24 ENCOUNTER — Other Ambulatory Visit: Payer: Self-pay | Admitting: *Deleted

## 2013-08-24 MED ORDER — PROMETHAZINE HCL 25 MG PO TABS: 25.0000 mg | ORAL_TABLET | Freq: Four times a day (QID) | ORAL | Status: DC | PRN

## 2013-08-25 ENCOUNTER — Encounter: Payer: Self-pay | Admitting: Family Medicine

## 2013-08-25 ENCOUNTER — Ambulatory Visit (INDEPENDENT_AMBULATORY_CARE_PROVIDER_SITE_OTHER): Payer: 59 | Admitting: Family Medicine

## 2013-08-25 VITALS — BP 137/91 | HR 100 | Wt 213.0 lb

## 2013-08-25 DIAGNOSIS — K0889 Other specified disorders of teeth and supporting structures: Secondary | ICD-10-CM

## 2013-08-25 DIAGNOSIS — K089 Disorder of teeth and supporting structures, unspecified: Secondary | ICD-10-CM | POA: Diagnosis not present

## 2013-08-25 DIAGNOSIS — G4733 Obstructive sleep apnea (adult) (pediatric): Secondary | ICD-10-CM | POA: Diagnosis not present

## 2013-08-25 DIAGNOSIS — G43109 Migraine with aura, not intractable, without status migrainosus: Secondary | ICD-10-CM | POA: Diagnosis not present

## 2013-08-25 MED ORDER — PROMETHAZINE HCL 25 MG/ML IJ SOLN: 25.0000 mg | Freq: Once | INTRAMUSCULAR | Status: DC

## 2013-08-25 MED ORDER — AMBULATORY NON FORMULARY MEDICATION: Status: DC

## 2013-08-25 MED ORDER — PREDNISONE 10 MG PO TABS: ORAL_TABLET | ORAL | Status: DC

## 2013-08-25 MED ORDER — PROMETHAZINE HCL 25 MG/ML IJ SOLN
25.0000 mg | Freq: Once | INTRAMUSCULAR | Status: AC
  Administered 2013-08-25: 25 mg via INTRAMUSCULAR

## 2013-08-25 NOTE — Patient Instructions (Addendum)
Check out the prescription medication called Belsomra for sleep. He may want to talk to Dr. Alfonso Ramus about it. To see if it may play well with your other medications. Also recommend that you start using her CPAP again. If we need to continue supplies then please let me know.

## 2013-08-25 NOTE — Progress Notes (Signed)
   Subjective:    Patient ID: Diane [name], female    DOB: 08-05-1970, 43 y.o.   MRN: [id number]  HPI Still having persistant sorethroat.  Constantly feels thirsty and carries water with her.  Feel on 03/24/14 and had a concussion. By Feb she was much better.  For March and April her HA have been nonstop.  Went to [facility] in mid-April.  Her ears have been hurting as well. Say will start in her ears. Will hear a loud noise and it triggers the headaces. Also using some imitrex. She is now on Tegretol   Not treating her OSA.  [facility]. She had a sleep study in 2012 at the [facility]. She was followed by Dr. Mosetta Anis at that time. She really has not used her CPAP in a couple of years. She was previously getting her supplies for [facility]. Review of Systems     Objective:   Physical Exam  Constitutional: She is oriented to person, place, and time. She appears well-developed and well-nourished.  HENT:  Head: Normocephalic and atraumatic.  Right Ear: External ear normal.  Left Ear: External ear normal.  Nose: Nose normal.  Mouth/Throat: Oropharynx is clear and moist.  TMs and canals are clear.   Eyes: Conjunctivae and EOM are normal. Pupils are equal, round, and reactive to light.  Neck: Neck supple. No thyromegaly present.  Cardiovascular: Normal rate, regular rhythm and normal heart sounds.   Pulmonary/Chest: Effort normal and breath sounds normal. She has no wheezes.  Lymphadenopathy:    She has no cervical adenopathy.  Neurological: She is alert and oriented to person, place, and time.  Skin: Skin is warm and dry.  Psychiatric: She has a normal mood and affect. Her behavior is normal.          Assessment & Plan:  Migraine Headache/status migrainous - stopped all medications for pain relief. We'll give her a redness on taper. Starting 80 mg and going down to 10 mg over 5 days. If this will help break the cycle. Given an injection of  Phenergan today for acute nausea. Her mother is with her and was driving. We discussed triggers for migraine including poor sleep, stress, changes in medications, and certain food triggers. Clearly something is exacerbating her symptoms recently. She would prefer to see someone else for her migraines. Will refer to Neurology, Dr. Geralynn Ochs. She thinks she may have seen him years ago.  She has not Tegretol for mood but typically this actually consisted help headaches.  Otalgia-gave reassurance. Her exam is normal. She could certainly have some eustachian tube dysfunction I really think this is the sensitivity for her migraines.  OSA - Needs to start using CPAP. Uses [facility].will re-intiaate order through Home health.    Time spent 30 min, > 50% spent counseling about migraines and sleep apnea.

## 2013-08-26 ENCOUNTER — Telehealth: Payer: Self-pay | Admitting: *Deleted

## 2013-08-26 NOTE — Telephone Encounter (Signed)
Pharmacy calling to get correction of prednisone.Diane [name]

## 2013-08-27 NOTE — Telephone Encounter (Signed)
Day 1- 8 tabs Day 2 - 6 tabs Day 3 - 4 tabs Day 4 - 2 tabs Day 5 - 1 tabs

## 2013-09-11 ENCOUNTER — Telehealth: Payer: Self-pay | Admitting: Family Medicine

## 2013-09-11 ENCOUNTER — Other Ambulatory Visit: Payer: Self-pay | Admitting: Family Medicine

## 2013-09-11 ENCOUNTER — Encounter: Payer: Self-pay | Admitting: Family Medicine

## 2013-09-11 ENCOUNTER — Ambulatory Visit (INDEPENDENT_AMBULATORY_CARE_PROVIDER_SITE_OTHER): Payer: 59 | Admitting: Family Medicine

## 2013-09-11 VITALS — BP 121/83 | HR 87 | Wt 211.0 lb

## 2013-09-11 DIAGNOSIS — R10816 Epigastric abdominal tenderness: Secondary | ICD-10-CM | POA: Diagnosis not present

## 2013-09-11 DIAGNOSIS — R110 Nausea: Secondary | ICD-10-CM

## 2013-09-11 DIAGNOSIS — IMO0001 Reserved for inherently not codable concepts without codable children: Secondary | ICD-10-CM | POA: Diagnosis not present

## 2013-09-11 DIAGNOSIS — G43009 Migraine without aura, not intractable, without status migrainosus: Secondary | ICD-10-CM | POA: Diagnosis not present

## 2013-09-11 MED ORDER — PROMETHAZINE HCL 6.25 MG/5ML PO SYRP: 25.0000 mg | ORAL_SOLUTION | Freq: Four times a day (QID) | ORAL | Status: DC | PRN

## 2013-09-11 NOTE — Telephone Encounter (Signed)
Can you see if lab can add TSH. I looked back and last one was in 2013.

## 2013-09-11 NOTE — Progress Notes (Signed)
Subjective:    Patient ID: Diane [name], female    DOB: 1970-09-17, 43 y.o.   MRN: [id number]  HPI Having persistant nausea - did start tegretol recently but takes it at night and the attacks of nausea ro during the daytime.  No abdominal pain, gas or belching.  Has had more diarrhea over the last couple of week.  Decreased appetite.  Has lost a couple of lbs.  Wonders if the nausea is related to her migraines.  Has been having more frequent migraines.  Body has felt achey.  Has felt weak. No blood in the stool.  Has appt with rheumatology. Did take 3 baclofen last night for a headache.  Occ uses a sumitriptan. Occ will use her zomig.  They are working on getting botox approved for her migraines at the headaches center.   Review of Systems  BP 121/83  Pulse 87  Wt 211 lb (95.709 kg)    Allergies  Allergen Reactions  . Eletriptan Nausea And Vomiting  . Lisinopril Other (See Comments)    Cough   . Relpax [Eletriptan Hydrobromide] Nausea And Vomiting  . Zyprexa [Olanzapine] Other (See Comments)    Hallucinations    Past Medical History  Diagnosis Date  . Anxiety   . Bipolar 1 disorder     rapid cycler-- Dr Yehuda Budd  . Hypertension   . Hypothyroidism     Dr Starleen Blue ([facility])  . Water retention   . GERD (gastroesophageal reflux disease)     GI Dr Percell Miller  . Interstitial cystitis   . Migraine   . Pulmonary embolism 01/25/11  . Polycystic ovarian disease   . Peptic ulcer disease   . Lupus   . Fibromyalgia     Past Surgical History  Procedure Laterality Date  . Cholecystectomy  2004    History   Social History  . Marital Status: Married    Spouse Name: N/A    Number of Children: N/A  . Years of Education: N/A   Occupational History  . Not on file.   Social History Main Topics  . Smoking status: Never Smoker   . Smokeless tobacco: Never Used  . Alcohol Use: Yes     Comment: rarely  . Drug Use: No  . Sexual Activity: No   Other Topics Concern  . Not  on file   Social History Narrative  . No narrative on file    Family History  Problem Relation Age of Onset  . Anxiety disorder Other   . Depression Other   . Hypertension Other   . Diabetes Mother   . Urolithiasis Father     Outpatient Encounter Prescriptions as of 09/11/2013  Medication Sig  . AMBULATORY NON FORMULARY MEDICATION Medication Name: CPAP machine. ResMed Ultra mirage style full face mask with heated humification and titrated to 10 cm H20. Diagnosis:OSA. See attached report  . Armodafinil (NUVIGIL) 250 MG tablet Take 250 mg by mouth daily.  Marland Kitchen aspirin 325 MG tablet Take 325 mg by mouth daily.  . carbamazepine (TEGRETOL XR) 400 MG 12 hr tablet Take 400 mg by mouth daily.  . clonazePAM (KLONOPIN) 1 MG tablet Take 1 mg by mouth 3 (three) times daily as needed for anxiety.  Marland Kitchen esomeprazole (NEXIUM) 40 MG capsule Take 40 mg by mouth daily before breakfast.    . ferrous sulfate 325 (65 FE) MG tablet TAKE 1 TABLET BY MOUTH EVERY DAY  . Gabapentin Enacarbil 600 MG TB24 Take 600 mg by mouth  daily.  Marland Kitchen HYDROcodone-acetaminophen (NORCO/VICODIN) 5-325 MG per tablet Take 2 tablets by mouth 2 (two) times daily as needed.  . hydroquinone 4 % cream Apply topically 2 (two) times daily.  Marland Kitchen L-Methylfolate-Algae (DEPLIN 15 PO) Take 1 tablet by mouth daily.  Marland Kitchen L-Methylfolate-B12-B6-B2 (CEREFOLIN) 09-21-48-5 MG TABS Take 1 tablet by mouth 1 day or 1 dose.  . levothyroxine (SYNTHROID, LEVOTHROID) 25 MCG tablet Take 25 mcg by mouth daily.    . metFORMIN (GLUCOPHAGE) 500 MG tablet Take 500 mg by mouth 2 (two) times daily with a meal.    . spironolactone (ALDACTONE) 25 MG tablet Take 25 mg by mouth daily.  . valACYclovir (VALTREX) 500 MG tablet   . verapamil (VERELAN PM) 240 MG 24 hr capsule Take 240 mg by mouth at bedtime.    Marland Kitchen ZOLMitriptan (ZOMIG) 2.5 MG SOLN Place 1 Squirt into the nose daily as needed.  . [DISCONTINUED] promethazine (PHENERGAN) 25 MG tablet Take 1 tablet (25 mg total) by mouth  every 6 (six) hours as needed for nausea.  . promethazine (PHENERGAN) 6.25 MG/5ML syrup Take 20 mLs (25 mg total) by mouth every 6 (six) hours as needed for nausea or vomiting.  . [DISCONTINUED] furosemide (LASIX) 20 MG tablet TAKE 1 TABLET BY MOUTH DAILY  . [DISCONTINUED] hyoscyamine (LEVBID) 0.375 MG 12 hr tablet Take 1 tablet (0.375 mg total) by mouth 2 (two) times daily as needed.  . [DISCONTINUED] ondansetron (ZOFRAN-ODT) 8 MG disintegrating tablet   . [DISCONTINUED] predniSONE (DELTASONE) 10 MG tablet 8 on day one, 6 on day 2, 4 on day 3:2 on day 4, one on day 5          Objective:   Physical Exam  Constitutional: She is oriented to person, place, and time. She appears well-developed and well-nourished.  HENT:  Head: Normocephalic and atraumatic.  Cardiovascular: Normal rate, regular rhythm and normal heart sounds.   Pulmonary/Chest: Effort normal and breath sounds normal.  Abdominal: Soft. Bowel sounds are normal. She exhibits no distension and no mass. There is tenderness. There is no rebound and no guarding.  Tender in the epigastric area   Neurological: She is alert and oriented to person, place, and time.  Skin: Skin is warm and dry.  Psychiatric: She has a normal mood and affect. Her behavior is normal.          Assessment & Plan:  Nausea - unclear etiology. She is tender in teh epigastric area so may be gastritis.  Increase Nexium to BID for one week. Will check labs to rule out pancreatitis, hepatitis, etc. Will check CBC as well.    Migraines - not well controlled but is hoping to get botox. Awaiting headache clinic.   Myalgias - Will check CBC and CK. She is concerned about fibromyalgia. Explained this is a diagnosis of exclusion and has to meet certain criteria. Can evaluate further at future visit.

## 2013-09-12 LAB — AMYLASE: AMYLASE: 70 U/L (ref 0–105)

## 2013-09-12 LAB — COMPLETE METABOLIC PANEL WITH GFR
ALK PHOS: 84 U/L (ref 39–117)
ALT: 15 U/L (ref 0–35)
AST: 14 U/L (ref 0–37)
Albumin: 3.8 g/dL (ref 3.5–5.2)
BUN: 6 mg/dL (ref 6–23)
CALCIUM: 8.9 mg/dL (ref 8.4–10.5)
CHLORIDE: 96 meq/L (ref 96–112)
CO2: 26 mEq/L (ref 19–32)
CREATININE: 0.78 mg/dL (ref 0.50–1.10)
GFR, Est African American: >89 mL/min
GFR, Est Non African American: >89 mL/min
Glucose, Bld: 79 mg/dL (ref 70–99)
Potassium: 4 mEq/L (ref 3.5–5.3)
Sodium: 130 mEq/L — ABNORMAL LOW (ref 135–145)
Total Bilirubin: 0.2 mg/dL (ref 0.2–1.2)
Total Protein: 7 g/dL (ref 6.0–8.3)

## 2013-09-12 LAB — CBC
HEMATOCRIT: 38 % (ref 36.0–46.0)
HEMOGLOBIN: 13 g/dL (ref 12.0–15.0)
MCH: 27.5 pg (ref 26.0–34.0)
MCHC: 34.2 g/dL (ref 30.0–36.0)
MCV: 80.5 fL (ref 78.0–100.0)
Platelets: 268 10*3/uL (ref 150–400)
RBC: 4.72 MIL/uL (ref 3.87–5.11)
RDW: 14.9 % (ref 11.5–15.5)
WBC: 8.8 10*3/uL (ref 4.0–10.5)

## 2013-09-12 LAB — LIPASE: LIPASE: 38 U/L (ref 0–75)

## 2013-09-12 LAB — SEDIMENTATION RATE: SED RATE: 14 mm/h (ref 0–22)

## 2013-09-12 LAB — CK: Total CK: 46 U/L (ref 7–177)

## 2013-09-15 DIAGNOSIS — Z1231 Encounter for screening mammogram for malignant neoplasm of breast: Secondary | ICD-10-CM | POA: Diagnosis not present

## 2013-09-16 ENCOUNTER — Other Ambulatory Visit: Payer: Self-pay | Admitting: *Deleted

## 2013-09-16 DIAGNOSIS — E871 Hypo-osmolality and hyponatremia: Secondary | ICD-10-CM

## 2013-09-16 LAB — TSH: TSH: 1.128 u[IU]/mL (ref 0.350–4.500)

## 2013-09-16 NOTE — Progress Notes (Signed)
Quick Note:  All labs are normal. ______ 

## 2013-09-16 NOTE — Telephone Encounter (Signed)
Spoke w/ Kennyth Lose at [facility] and asked that she add on TSH to [id number]

## 2013-09-18 ENCOUNTER — Telehealth: Payer: Self-pay | Admitting: *Deleted

## 2013-09-18 NOTE — Telephone Encounter (Signed)
Pt called and stated that she was having severe pain in her abdomen,n/d. I told her that she should contact her GI if she is having these types of sxs. She stated that she did call and is waiting a call back. She also wanted to know what could she do to increase her electrolytes I told her to google it to get a list of foods.Diane [name]

## 2013-09-19 ENCOUNTER — Other Ambulatory Visit: Payer: Self-pay | Admitting: Family Medicine

## 2013-09-22 ENCOUNTER — Ambulatory Visit: Payer: 59 | Admitting: Family Medicine

## 2013-09-22 DIAGNOSIS — R209 Unspecified disturbances of skin sensation: Secondary | ICD-10-CM | POA: Diagnosis not present

## 2013-09-22 DIAGNOSIS — G43709 Chronic migraine without aura, not intractable, without status migrainosus: Secondary | ICD-10-CM | POA: Diagnosis not present

## 2013-09-22 DIAGNOSIS — M359 Systemic involvement of connective tissue, unspecified: Secondary | ICD-10-CM | POA: Diagnosis not present

## 2013-09-22 DIAGNOSIS — M542 Cervicalgia: Secondary | ICD-10-CM | POA: Diagnosis not present

## 2013-09-22 DIAGNOSIS — IMO0001 Reserved for inherently not codable concepts without codable children: Secondary | ICD-10-CM | POA: Diagnosis not present

## 2013-10-01 ENCOUNTER — Ambulatory Visit: Payer: 59 | Admitting: Family Medicine

## 2013-10-08 ENCOUNTER — Encounter: Payer: Self-pay | Admitting: Family Medicine

## 2013-10-08 ENCOUNTER — Ambulatory Visit (INDEPENDENT_AMBULATORY_CARE_PROVIDER_SITE_OTHER): Payer: 59 | Admitting: Family Medicine

## 2013-10-08 VITALS — BP 150/98 | HR 125 | Wt 210.0 lb

## 2013-10-08 DIAGNOSIS — R110 Nausea: Secondary | ICD-10-CM | POA: Diagnosis not present

## 2013-10-08 DIAGNOSIS — M797 Fibromyalgia: Secondary | ICD-10-CM

## 2013-10-08 DIAGNOSIS — E871 Hypo-osmolality and hyponatremia: Secondary | ICD-10-CM

## 2013-10-08 DIAGNOSIS — IMO0001 Reserved for inherently not codable concepts without codable children: Secondary | ICD-10-CM

## 2013-10-08 DIAGNOSIS — G43011 Migraine without aura, intractable, with status migrainosus: Secondary | ICD-10-CM

## 2013-10-08 DIAGNOSIS — B370 Candidal stomatitis: Secondary | ICD-10-CM

## 2013-10-08 MED ORDER — CYCLOBENZAPRINE HCL 5 MG PO TABS: 2.5000 mg | ORAL_TABLET | Freq: Three times a day (TID) | ORAL | Status: DC | PRN

## 2013-10-08 MED ORDER — FLEXERIL 5 MG PO TABS: 2.5000 mg | ORAL_TABLET | Freq: Three times a day (TID) | ORAL | Status: DC | PRN

## 2013-10-08 MED ORDER — HYDROCODONE-ACETAMINOPHEN 5-325 MG PO TABS: 2.0000 | ORAL_TABLET | Freq: Two times a day (BID) | ORAL | Status: DC | PRN

## 2013-10-08 MED ORDER — NYSTATIN 100000 UNIT/ML MT SUSP: 5.0000 mL | Freq: Four times a day (QID) | OROMUCOSAL | Status: DC

## 2013-10-08 NOTE — Addendum Note (Signed)
Addended by: Beatrice Lecher D on: 10/08/2013 05:44 PM   Modules accepted: Orders, Medications

## 2013-10-08 NOTE — Progress Notes (Addendum)
   Subjective:    Patient ID: Diane [name], female    DOB: 14-Feb-1971, 43 y.o.   MRN: [id number]  HPI Nausea - not well controlled. She is taking her nexium BID.    Fibromyalgia - She wants to know what she could take or do to help her symptoms. She feels like her pain has not been well controlled. Has been tryin got do yoga.   Migraine - did get the botox injections but didn't really help.  Still having a lot of ear pain.   She's had a sore throat for several weeks and noticed a rash in her mouth this week. She says it does feel little bit tender. She's also had persistent ear pain. She actually has scheduled appointment with ENT for next week. She thinks that somehow related to her migraines as her ear pain intensifies when she is having more severe headaches.  Review of Systems     Objective:   Physical Exam  Constitutional: She is oriented to person, place, and time. She appears well-developed and well-nourished.  HENT:  Head: Normocephalic and atraumatic.  Right Ear: External ear normal.  Left Ear: External ear normal.  Nose: Nose normal.  Mouth/Throat: Oropharyngeal exudate present.  TMs and canals are clear. She has a white coating on the left and side of the cheek and on the top of her tongue. She has some petechiae on the soft palate.  Eyes: Conjunctivae and EOM are normal. Pupils are equal, round, and reactive to light.  Neck: Neck supple. No thyromegaly present.  Cardiovascular: Normal rate.   Pulmonary/Chest: She has no wheezes.  Lymphadenopathy:    She has no cervical adenopathy.  Neurological: She is alert and oriented to person, place, and time.  Skin: Skin is warm and dry.  Psychiatric: She has a normal mood and affect.          Assessment & Plan:  Nausea - will test for H pylori.  She will have to be off Nexium for 2 weeks.   Migraine - unfortunately she did not improve with Botox injections as very hopeful that this will provide some significant  relief for her.  Fibromyalgia - She is doing some exercise.  She has been working on sleep for years with her psychiatrist.  We could try adding flexeril. She has tried baclofen in the past but never worked well for her. Recommend and NSAID for pain relief as well so not having to use so much hydrocodone. Make sure to take with food and water.  Patient requested brand Flexeril. Received notes from the pharmacy saying that they don't make any more. We'll send her for generic.  Ritta Slot - will treat with nystatin swish and swallow. Call if not improving in the next week. Unsure why she would have thrush. She does not use steroid inhalers. She's not have a suppressed immune system them aware of.  Hyponatremia-explained her that lithium could actually be contributing to some of her nausea. A Foley want to recheck her level. If it's still low then we need to do further workup and evaluation.

## 2013-10-08 NOTE — Patient Instructions (Addendum)
Stop the Nexium. Try the muscle relaxer, cyclobenzaprine/Flexeril Go to the lab to check the sodium when you can.   If the muscle relaxer is not really helping then recommend a trial of ibuprofen 600 mg 2-3 times a day. Make sure take with food and water and stop immediately if upset in the stomach. Followup in 2 weeks so that we can do the breath test for H. pylori.

## 2013-10-21 DIAGNOSIS — F331 Major depressive disorder, recurrent, moderate: Secondary | ICD-10-CM | POA: Diagnosis not present

## 2013-10-22 ENCOUNTER — Ambulatory Visit: Payer: 59

## 2013-10-22 ENCOUNTER — Other Ambulatory Visit: Payer: Self-pay | Admitting: Family Medicine

## 2013-10-22 DIAGNOSIS — M5412 Radiculopathy, cervical region: Secondary | ICD-10-CM | POA: Diagnosis not present

## 2013-10-22 DIAGNOSIS — G5610 Other lesions of median nerve, unspecified upper limb: Secondary | ICD-10-CM | POA: Diagnosis not present

## 2013-10-22 DIAGNOSIS — IMO0001 Reserved for inherently not codable concepts without codable children: Secondary | ICD-10-CM | POA: Diagnosis not present

## 2013-10-22 DIAGNOSIS — G3184 Mild cognitive impairment, so stated: Secondary | ICD-10-CM | POA: Diagnosis not present

## 2013-10-22 DIAGNOSIS — G609 Hereditary and idiopathic neuropathy, unspecified: Secondary | ICD-10-CM | POA: Diagnosis not present

## 2013-10-22 DIAGNOSIS — IMO0002 Reserved for concepts with insufficient information to code with codable children: Secondary | ICD-10-CM | POA: Diagnosis not present

## 2013-10-30 ENCOUNTER — Ambulatory Visit: Payer: 59

## 2013-10-30 ENCOUNTER — Telehealth: Payer: Self-pay | Admitting: *Deleted

## 2013-10-30 DIAGNOSIS — J309 Allergic rhinitis, unspecified: Secondary | ICD-10-CM | POA: Diagnosis not present

## 2013-10-30 DIAGNOSIS — H9209 Otalgia, unspecified ear: Secondary | ICD-10-CM | POA: Diagnosis not present

## 2013-10-30 DIAGNOSIS — G43909 Migraine, unspecified, not intractable, without status migrainosus: Secondary | ICD-10-CM | POA: Diagnosis not present

## 2013-10-30 DIAGNOSIS — G4733 Obstructive sleep apnea (adult) (pediatric): Secondary | ICD-10-CM | POA: Diagnosis not present

## 2013-10-30 NOTE — Telephone Encounter (Signed)
Diane [name] presented for testing today and was very unhappy that she was unable to have it completed. She was drinking water and had chewing gum in her mouth. I told her that we would have to reschedule her test and she said "you're killing me" and that I didn't tell her not to chew any gum. I did explain to her on the phone last week when I called myself to confirm appt and that she was off the nexium that she could not have anything in her mouth for 1 hr prior to exam. I said do not brush your teeth, chew gum, chew ice, smoke, drink water, pop etc. She said that I didn't tell her this and I said [name] I did tell you this because you do not want the bile and stomach acid activated in your stomach and tainting the test. She was visibly upset and wanted me to wait and do the test later on this afternoon. I told her that she would have to reschedule and earlier in the day would really be ideal. She left upset and said she would call to reschedule and she has been scheduled twice now for this test. Margette Fast, CMA

## 2013-10-31 LAB — BASIC METABOLIC PANEL
BUN: 12 mg/dL (ref 6–23)
CALCIUM: 9.4 mg/dL (ref 8.4–10.5)
CO2: 23 mEq/L (ref 19–32)
Chloride: 95 mEq/L — ABNORMAL LOW (ref 96–112)
Creat: 0.76 mg/dL (ref 0.50–1.10)
Glucose, Bld: 93 mg/dL (ref 70–99)
Potassium: 3.8 mEq/L (ref 3.5–5.3)
Sodium: 129 mEq/L — ABNORMAL LOW (ref 135–145)

## 2013-11-04 ENCOUNTER — Other Ambulatory Visit: Payer: Self-pay | Admitting: *Deleted

## 2013-11-04 ENCOUNTER — Other Ambulatory Visit: Payer: Self-pay | Admitting: Family Medicine

## 2013-11-04 DIAGNOSIS — E871 Hypo-osmolality and hyponatremia: Secondary | ICD-10-CM

## 2013-11-04 DIAGNOSIS — F4323 Adjustment disorder with mixed anxiety and depressed mood: Secondary | ICD-10-CM | POA: Diagnosis not present

## 2013-11-13 DIAGNOSIS — K5900 Constipation, unspecified: Secondary | ICD-10-CM | POA: Diagnosis not present

## 2013-11-13 DIAGNOSIS — K219 Gastro-esophageal reflux disease without esophagitis: Secondary | ICD-10-CM | POA: Diagnosis not present

## 2013-11-13 DIAGNOSIS — K3189 Other diseases of stomach and duodenum: Secondary | ICD-10-CM | POA: Diagnosis not present

## 2013-11-17 DIAGNOSIS — G609 Hereditary and idiopathic neuropathy, unspecified: Secondary | ICD-10-CM | POA: Diagnosis not present

## 2013-11-17 DIAGNOSIS — M5412 Radiculopathy, cervical region: Secondary | ICD-10-CM | POA: Diagnosis not present

## 2013-11-17 DIAGNOSIS — IMO0002 Reserved for concepts with insufficient information to code with codable children: Secondary | ICD-10-CM | POA: Diagnosis not present

## 2013-11-17 DIAGNOSIS — G5610 Other lesions of median nerve, unspecified upper limb: Secondary | ICD-10-CM | POA: Diagnosis not present

## 2013-11-18 DIAGNOSIS — R894 Abnormal immunological findings in specimens from other organs, systems and tissues: Secondary | ICD-10-CM | POA: Diagnosis not present

## 2013-11-18 DIAGNOSIS — M359 Systemic involvement of connective tissue, unspecified: Secondary | ICD-10-CM | POA: Diagnosis not present

## 2013-11-18 DIAGNOSIS — G43909 Migraine, unspecified, not intractable, without status migrainosus: Secondary | ICD-10-CM | POA: Diagnosis not present

## 2013-11-18 DIAGNOSIS — IMO0001 Reserved for inherently not codable concepts without codable children: Secondary | ICD-10-CM | POA: Diagnosis not present

## 2013-11-21 ENCOUNTER — Other Ambulatory Visit: Payer: Self-pay | Admitting: Family Medicine

## 2013-12-02 DIAGNOSIS — D6859 Other primary thrombophilia: Secondary | ICD-10-CM | POA: Diagnosis not present

## 2013-12-02 DIAGNOSIS — M359 Systemic involvement of connective tissue, unspecified: Secondary | ICD-10-CM | POA: Diagnosis not present

## 2013-12-02 DIAGNOSIS — IMO0001 Reserved for inherently not codable concepts without codable children: Secondary | ICD-10-CM | POA: Diagnosis not present

## 2013-12-10 ENCOUNTER — Other Ambulatory Visit: Payer: Self-pay | Admitting: Family Medicine

## 2013-12-21 ENCOUNTER — Other Ambulatory Visit: Payer: Self-pay | Admitting: Family Medicine

## 2013-12-22 ENCOUNTER — Telehealth: Payer: Self-pay | Admitting: *Deleted

## 2013-12-22 NOTE — Telephone Encounter (Signed)
Dr Suzi Roots said that patient needs seen in office for further refills. Margette Fast, CMA

## 2013-12-23 DIAGNOSIS — E039 Hypothyroidism, unspecified: Secondary | ICD-10-CM | POA: Diagnosis not present

## 2013-12-23 DIAGNOSIS — Z86711 Personal history of pulmonary embolism: Secondary | ICD-10-CM | POA: Diagnosis not present

## 2013-12-23 DIAGNOSIS — D6859 Other primary thrombophilia: Secondary | ICD-10-CM | POA: Diagnosis not present

## 2013-12-23 DIAGNOSIS — M359 Systemic involvement of connective tissue, unspecified: Secondary | ICD-10-CM | POA: Diagnosis not present

## 2013-12-24 DIAGNOSIS — G43709 Chronic migraine without aura, not intractable, without status migrainosus: Secondary | ICD-10-CM | POA: Diagnosis not present

## 2013-12-30 ENCOUNTER — Other Ambulatory Visit: Payer: Self-pay | Admitting: Family Medicine

## 2013-12-30 DIAGNOSIS — M545 Low back pain, unspecified: Secondary | ICD-10-CM | POA: Diagnosis not present

## 2013-12-30 DIAGNOSIS — M542 Cervicalgia: Secondary | ICD-10-CM | POA: Insufficient documentation

## 2014-01-19 DIAGNOSIS — M19029 Primary osteoarthritis, unspecified elbow: Secondary | ICD-10-CM | POA: Diagnosis not present

## 2014-01-19 DIAGNOSIS — G4730 Sleep apnea, unspecified: Secondary | ICD-10-CM | POA: Diagnosis not present

## 2014-01-19 DIAGNOSIS — M949 Disorder of cartilage, unspecified: Secondary | ICD-10-CM | POA: Diagnosis not present

## 2014-01-19 DIAGNOSIS — Z79899 Other long term (current) drug therapy: Secondary | ICD-10-CM | POA: Diagnosis not present

## 2014-01-19 DIAGNOSIS — M25569 Pain in unspecified knee: Secondary | ICD-10-CM | POA: Diagnosis not present

## 2014-01-19 DIAGNOSIS — M899 Disorder of bone, unspecified: Secondary | ICD-10-CM | POA: Diagnosis not present

## 2014-01-19 DIAGNOSIS — G43809 Other migraine, not intractable, without status migrainosus: Secondary | ICD-10-CM | POA: Diagnosis not present

## 2014-01-19 DIAGNOSIS — G709 Myoneural disorder, unspecified: Secondary | ICD-10-CM | POA: Diagnosis not present

## 2014-01-19 DIAGNOSIS — IMO0001 Reserved for inherently not codable concepts without codable children: Secondary | ICD-10-CM | POA: Diagnosis not present

## 2014-01-19 DIAGNOSIS — Z888 Allergy status to other drugs, medicaments and biological substances status: Secondary | ICD-10-CM | POA: Diagnosis not present

## 2014-01-19 DIAGNOSIS — E039 Hypothyroidism, unspecified: Secondary | ICD-10-CM | POA: Diagnosis not present

## 2014-01-19 DIAGNOSIS — D6859 Other primary thrombophilia: Secondary | ICD-10-CM | POA: Diagnosis not present

## 2014-01-19 DIAGNOSIS — M20029 Boutonniere deformity of unspecified finger(s): Secondary | ICD-10-CM | POA: Diagnosis not present

## 2014-01-19 DIAGNOSIS — Z8659 Personal history of other mental and behavioral disorders: Secondary | ICD-10-CM | POA: Diagnosis not present

## 2014-01-21 DIAGNOSIS — K3580 Unspecified acute appendicitis: Secondary | ICD-10-CM | POA: Insufficient documentation

## 2014-01-23 ENCOUNTER — Other Ambulatory Visit: Payer: Self-pay | Admitting: Family Medicine

## 2014-02-17 ENCOUNTER — Other Ambulatory Visit: Payer: Self-pay | Admitting: Family Medicine

## 2014-02-18 ENCOUNTER — Telehealth: Payer: Self-pay | Admitting: *Deleted

## 2014-02-18 NOTE — Telephone Encounter (Signed)
Message left to call and schedule appointment. Her medications were refused and as per Dr. Madilyn Fireman she is to be seen before any further medications are refilled. Margette Fast, CMA

## 2014-03-03 DIAGNOSIS — R413 Other amnesia: Secondary | ICD-10-CM | POA: Insufficient documentation

## 2014-03-03 DIAGNOSIS — R51 Headache: Secondary | ICD-10-CM

## 2014-03-03 DIAGNOSIS — R519 Headache, unspecified: Secondary | ICD-10-CM | POA: Insufficient documentation

## 2014-03-05 ENCOUNTER — Telehealth: Payer: Self-pay | Admitting: Family Medicine

## 2014-03-05 ENCOUNTER — Ambulatory Visit: Payer: 59 | Admitting: Family Medicine

## 2014-03-05 DIAGNOSIS — M797 Fibromyalgia: Secondary | ICD-10-CM | POA: Diagnosis not present

## 2014-03-05 DIAGNOSIS — M329 Systemic lupus erythematosus, unspecified: Secondary | ICD-10-CM | POA: Diagnosis not present

## 2014-03-05 DIAGNOSIS — G4709 Other insomnia: Secondary | ICD-10-CM | POA: Diagnosis not present

## 2014-03-05 DIAGNOSIS — G2581 Restless legs syndrome: Secondary | ICD-10-CM | POA: Diagnosis not present

## 2014-03-05 DIAGNOSIS — M359 Systemic involvement of connective tissue, unspecified: Secondary | ICD-10-CM | POA: Diagnosis not present

## 2014-03-05 NOTE — Telephone Encounter (Signed)
Ms. Reid-Parker called. She wants dr to know that the bloodwork she was getting done  is being done by her rheumatologist.

## 2014-03-07 NOTE — Telephone Encounter (Signed)
OK.  If sodium is still low then we can refer her to an endocrinologist for further evaluation.

## 2014-03-24 DIAGNOSIS — R413 Other amnesia: Secondary | ICD-10-CM | POA: Diagnosis not present

## 2014-03-25 ENCOUNTER — Other Ambulatory Visit: Payer: Self-pay

## 2014-03-25 MED ORDER — CLONAZEPAM 1 MG PO TABS: 1.0000 mg | ORAL_TABLET | Freq: Two times a day (BID) | ORAL | Status: DC

## 2014-03-25 MED ORDER — LEVOTHYROXINE SODIUM 25 MCG PO TABS
25.0000 ug | ORAL_TABLET | Freq: Every day | ORAL | Status: DC
Start: 2014-03-25 — End: 2014-07-15

## 2014-03-25 MED ORDER — VERAPAMIL HCL ER 240 MG PO CP24: 240.0000 mg | ORAL_CAPSULE | Freq: Every day | ORAL | Status: DC

## 2014-03-25 NOTE — Telephone Encounter (Signed)
Carrie Mew MD Address: [address] [place], [place] [address] [phone]

## 2014-03-30 ENCOUNTER — Ambulatory Visit (INDEPENDENT_AMBULATORY_CARE_PROVIDER_SITE_OTHER): Payer: 59 | Admitting: Family Medicine

## 2014-03-30 ENCOUNTER — Encounter: Payer: Self-pay | Admitting: Family Medicine

## 2014-03-30 VITALS — BP 145/95 | HR 86 | Wt 209.0 lb

## 2014-03-30 DIAGNOSIS — M5010 Cervical disc disorder with radiculopathy, unspecified cervical region: Secondary | ICD-10-CM

## 2014-03-30 DIAGNOSIS — Z5181 Encounter for therapeutic drug level monitoring: Secondary | ICD-10-CM

## 2014-03-30 DIAGNOSIS — M545 Low back pain: Secondary | ICD-10-CM

## 2014-03-30 DIAGNOSIS — F319 Bipolar disorder, unspecified: Secondary | ICD-10-CM

## 2014-03-30 DIAGNOSIS — G43009 Migraine without aura, not intractable, without status migrainosus: Secondary | ICD-10-CM

## 2014-03-30 MED ORDER — DEPLIN 7.5 MG PO TABS: 1.0000 | ORAL_TABLET | Freq: Every day | ORAL | Status: DC

## 2014-03-30 MED ORDER — ARMODAFINIL 250 MG PO TABS: 250.0000 mg | ORAL_TABLET | Freq: Every day | ORAL | Status: DC

## 2014-03-30 MED ORDER — CEREFOLIN NAC 6-90.314-2-600 MG PO TABS: 1.0000 | ORAL_TABLET | Freq: Every day | ORAL | Status: DC

## 2014-03-30 MED ORDER — GABAPENTIN ENACARBIL ER 600 MG PO TBCR: 600.0000 mg | EXTENDED_RELEASE_TABLET | Freq: Every day | ORAL | Status: DC

## 2014-03-30 MED ORDER — CARBAMAZEPINE ER 200 MG PO TB12: 600.0000 mg | ORAL_TABLET | Freq: Every day | ORAL | Status: DC

## 2014-03-30 MED ORDER — VERAPAMIL HCL ER 240 MG PO CP24: 240.0000 mg | ORAL_CAPSULE | Freq: Every day | ORAL | Status: DC

## 2014-03-30 MED ORDER — PURALOR CI PO TBCR: 1.0000 | EXTENDED_RELEASE_TABLET | Freq: Every day | ORAL | Status: DC

## 2014-03-30 MED ORDER — HYDROXYZINE HCL 25 MG PO TABS: 50.0000 mg | ORAL_TABLET | Freq: Every day | ORAL | Status: DC

## 2014-03-30 NOTE — Patient Instructions (Signed)
Dr. Louanna Raw. Jake Samples, MD  Psychiatrist  Address: [address] [place], [place] [address]  [phone]

## 2014-03-30 NOTE — Progress Notes (Signed)
   Subjective:    Patient ID: Diane [name], female    DOB: 1970-07-31, 43 y.o.   MRN: [id number]  HPI   Hasn't been on her spironolactone for awhile.  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    Her Psychiatrist, Dr. Finis Bud has had 2 massive heart attacks and is no longer practicing.  Her therapist is no longer there.  She hasn't found a therapist.  Dr. Finis Bud had  referred her for Neuropsych at [facility], Dr. Dionne Milo for memory issues. She now needs to find a new psych and new therapist to prescribe her regular medications.     Neurologist - She sees Dr. Trula Ore, and Dr. Sima Matas and Dr. Darrold Span. She is now getting Botox injections for her migraines. Has been doing falling more often.  The botox has been helpful. She retired topamax but felt like it really "threw her off" so she stopped it.  Had been on it several times in the past.      Had had neck and low back pain. Says had some radicular she was initially referred to physical therapy for the McKenzie method in [place], but ended up having surgery on her finger so was unable to start physical therapy. She prefers [place].  Did have "steroid" injections into her neck but says they were really painful and doesn't want to to that again.  Her rheumatologist, Dr. Dossie Der recommend tumeric and ginger.   Review of Systems     Objective:   Physical Exam  Constitutional: She is oriented to person, place, and time. She appears well-developed and well-nourished.  HENT:  Head: Normocephalic and atraumatic.  Right Ear: External ear normal.  Left Ear: External ear normal.  Nose: Nose normal.  Mouth/Throat: Oropharynx is clear and moist.  TMs and canals are clear.   Eyes: Conjunctivae and EOM are normal. Pupils are equal, round, and reactive to light.  Neck: Neck supple. No thyromegaly present.  Cardiovascular: Normal rate, regular rhythm and normal heart sounds.   Pulmonary/Chest: Effort normal and breath sounds normal. She  has no wheezes.  Lymphadenopathy:    She has no cervical adenopathy.  Neurological: She is alert and oriented to person, place, and time.  Skin: Skin is warm and dry.  Psychiatric: She has a normal mood and affect.          Assessment & Plan:  Bipolar 1 - will fill meds until able to get in with pscyh. Will refer to Dr. Jake Samples in [facility]. If doesn't take insurance then consider other options.  She says Dr. Janeice Robinson office hasn't made any recommendations to her.    Cervical radiculopathy and lumbar pain  - will refer to PT who does the McKenzie technique.   Migraine HE- doing a series of Botox injections and getting some relief.  Though has a HA today.    Falling -  Recommend discuss further with  Her neurologist who she is seeing next week.   Time spent 45 min, > 50% spent discussing her bipolar, neck and low back pain, migraine headaches and recent falls.

## 2014-03-31 DIAGNOSIS — M79641 Pain in right hand: Secondary | ICD-10-CM | POA: Diagnosis not present

## 2014-03-31 DIAGNOSIS — M20029 Boutonniere deformity of unspecified finger(s): Secondary | ICD-10-CM | POA: Insufficient documentation

## 2014-03-31 DIAGNOSIS — M20021 Boutonniere deformity of right finger(s): Secondary | ICD-10-CM | POA: Diagnosis not present

## 2014-03-31 DIAGNOSIS — M25531 Pain in right wrist: Secondary | ICD-10-CM | POA: Diagnosis not present

## 2014-03-31 LAB — BASIC METABOLIC PANEL
BUN: 7 mg/dL (ref 6–23)
CHLORIDE: 100 meq/L (ref 96–112)
CO2: 28 mEq/L (ref 19–32)
CREATININE: 0.72 mg/dL (ref 0.50–1.10)
Calcium: 8.9 mg/dL (ref 8.4–10.5)
Glucose, Bld: 87 mg/dL (ref 70–99)
Potassium: 3.8 mEq/L (ref 3.5–5.3)
SODIUM: 136 meq/L (ref 135–145)

## 2014-03-31 LAB — CARBAMAZEPINE LEVEL, TOTAL: CARBAMAZEPINE LVL: 7.3 ug/mL (ref 4.0–12.0)

## 2014-03-31 NOTE — Progress Notes (Signed)
Quick Note:  All labs are normal. ______ 

## 2014-04-06 ENCOUNTER — Telehealth: Payer: Self-pay | Admitting: *Deleted

## 2014-04-06 DIAGNOSIS — M545 Low back pain, unspecified: Secondary | ICD-10-CM | POA: Insufficient documentation

## 2014-04-06 DIAGNOSIS — M5010 Cervical disc disorder with radiculopathy, unspecified cervical region: Secondary | ICD-10-CM

## 2014-04-06 NOTE — Telephone Encounter (Signed)
Diane [name] calls today inquiring about referrals that were to be placed last week after her office visit. She was interested mainly in the one for PT that does a McKenzie technique. She also mentioned Dr. Chaya Jan but I didn't find his name in the chart but she did say it was a place in [facility]. Are you aware of these referrals? Please advise. Margette Fast, CMa

## 2014-04-07 NOTE — Telephone Encounter (Signed)
I did find out that [facility] does have someone he does the McKenzie technique. Also I did place the referral the same day for behavioral health with Dr. Jake Samples in [facility]. Per the notes it looks like the information was faxed the following day on the ninth to his office. Please check on the status of this with Anderson Malta.

## 2014-04-07 NOTE — Telephone Encounter (Signed)
Forwarding message to Anderson Malta to call Jadan with referrals.

## 2014-04-12 ENCOUNTER — Other Ambulatory Visit: Payer: Self-pay | Admitting: Family Medicine

## 2014-04-13 DIAGNOSIS — G43701 Chronic migraine without aura, not intractable, with status migrainosus: Secondary | ICD-10-CM | POA: Diagnosis not present

## 2014-04-19 ENCOUNTER — Telehealth: Payer: Self-pay | Admitting: *Deleted

## 2014-04-19 NOTE — Telephone Encounter (Signed)
Pt called and lvm stating that [facility] is waiting for a PA for her medications and she is requesting a return call with regards to this. She stated that she has been calling since last week.Diane [name]

## 2014-04-26 NOTE — Telephone Encounter (Signed)
I had submitted 3 prior auth's via cover my meds for Puralor, Deplin and Cerefolin. I was then prompted to call customer service and after speaking with Juliann Pulse @ [facility] I was advised that puralor and deplin are plan exclusiions and are not on her formulary so therefore there is no coverage criteria for a prior authorization and they were not covered. The cerefolin is now considered a vitamin. I called and let Laquasha now what has transpired. She was grateful and would contact [facility].

## 2014-05-03 ENCOUNTER — Other Ambulatory Visit: Payer: Self-pay | Admitting: *Deleted

## 2014-05-03 MED ORDER — CLONAZEPAM 1 MG PO TABS: 1.0000 mg | ORAL_TABLET | Freq: Two times a day (BID) | ORAL | Status: DC

## 2014-05-04 ENCOUNTER — Encounter: Payer: Self-pay | Admitting: Family Medicine

## 2014-05-04 ENCOUNTER — Ambulatory Visit (INDEPENDENT_AMBULATORY_CARE_PROVIDER_SITE_OTHER): Payer: 59 | Admitting: Family Medicine

## 2014-05-04 VITALS — BP 138/82 | HR 97 | Wt 205.0 lb

## 2014-05-04 DIAGNOSIS — K2100 Gastro-esophageal reflux disease with esophagitis, without bleeding: Secondary | ICD-10-CM

## 2014-05-04 DIAGNOSIS — H6983 Other specified disorders of Eustachian tube, bilateral: Secondary | ICD-10-CM | POA: Diagnosis not present

## 2014-05-04 DIAGNOSIS — J387 Other diseases of larynx: Secondary | ICD-10-CM | POA: Diagnosis not present

## 2014-05-04 DIAGNOSIS — K209 Esophagitis, unspecified without bleeding: Secondary | ICD-10-CM | POA: Insufficient documentation

## 2014-05-04 DIAGNOSIS — E669 Obesity, unspecified: Secondary | ICD-10-CM | POA: Diagnosis not present

## 2014-05-04 DIAGNOSIS — J310 Chronic rhinitis: Secondary | ICD-10-CM | POA: Diagnosis not present

## 2014-05-04 DIAGNOSIS — M5010 Cervical disc disorder with radiculopathy, unspecified cervical region: Secondary | ICD-10-CM

## 2014-05-04 DIAGNOSIS — G43009 Migraine without aura, not intractable, without status migrainosus: Secondary | ICD-10-CM | POA: Diagnosis not present

## 2014-05-04 DIAGNOSIS — F319 Bipolar disorder, unspecified: Secondary | ICD-10-CM | POA: Diagnosis not present

## 2014-05-04 MED ORDER — PHENTERMINE HCL 15 MG PO CAPS: 15.0000 mg | ORAL_CAPSULE | ORAL | Status: DC

## 2014-05-04 MED ORDER — HYDROCODONE-ACETAMINOPHEN 5-325 MG PO TABS: 2.0000 | ORAL_TABLET | Freq: Two times a day (BID) | ORAL | Status: DC | PRN

## 2014-05-04 MED ORDER — HYDROQUINONE 4 % EX CREA
TOPICAL_CREAM | Freq: Two times a day (BID) | CUTANEOUS | Status: DC
Start: 2014-05-04 — End: 2014-10-14

## 2014-05-04 NOTE — Progress Notes (Signed)
   Subjective:    Patient ID: Diane [name], female    DOB: 1970-09-11, 44 y.o.   MRN: [id number]  HPI Bipolar 1 D/o. Never heard form Dr. Jake Samples office. Will call and see where she is at in referral process.     Saw Dr. Valora Corporal, ENT,  for her throat and had a scope and was told had reflux causing swelling of the vocal cords.  She is now on [place] with Rheumatology on her chronic fatigue.  They are thinking about putting her on a stimulant for energy.   Migraine Headache - she is now getting Botox injections and this seems to be working well.  Abnormal weight - Says really doesn't eat much. Not exercising regularly. She still wants to discuss options for weight loss.  Follow-up cervical radiculopathy-she did start physical therapy with integrative therapies and is doing well. She feels like she's making some great progress.  Review of Systems     Objective:   Physical Exam  Constitutional: She is oriented to person, place, and time. She appears well-developed and well-nourished.  HENT:  Head: Normocephalic and atraumatic.  Cardiovascular: Normal rate, regular rhythm and normal heart sounds.   Pulmonary/Chest: Effort normal and breath sounds normal.  Neurological: She is alert and oriented to person, place, and time.  Skin: Skin is warm and dry.  Psychiatric: She has a normal mood and affect. Her behavior is normal.          Assessment & Plan:  Bipolar 1 disorder-will work on trying to get the referral for her. We placed for about a month ago but she has not heard back. We tried to call today to have a leave a message. We did write a letter and are faxing it today to see if her insurance will help cover her supplements that she uses with her medications. She was mostly getting these 3 samples through her psychiatrist and felt like they were very helpful for her.  Reflux-doing well on dexilant.   Abnormal weight gain-discussed options including a low-dose of  phentermine. Certainly if she feels like it's too stimulating her causing sleep problems and she can stop immediately. We'll start with 15 mg. Discussed the importance of setting some weight loss goals for herself. She is very familiar with the smart phone application called my fitness pal. She says she will start using it again and start some type of exercise routine. She will need to follow up in one month for blood pressure and weight check if she's doing well on the medication we can continue it. I did discuss that it has only been studied short-term and we can only use it short-term.  Migraine headaches-doing better getting Botox injections.  Cervical radiculopathy-continue PT since it seems to be helping.

## 2014-05-11 ENCOUNTER — Telehealth: Payer: Self-pay | Admitting: *Deleted

## 2014-05-11 NOTE — Telephone Encounter (Signed)
Pt informed that Dr. Madilyn Fireman can refer her downstairs for f/u of psychiatry if she is ok with this. Or if she has someone else in mind we will make the referral if needed. Pt also informed that we are waiting to hear back from [facility] regarding her meds. She was under the impression that it would not take this long. I apologized for the inconvenience. She informed me that she would check around to see if she can be seen by someone else and call back with information.Diane [name]

## 2014-05-12 ENCOUNTER — Telehealth: Payer: Self-pay | Admitting: *Deleted

## 2014-05-12 NOTE — Telephone Encounter (Signed)
I called Diane [name] and made her aware of denial letter received today for medications. She was unhappy and stated that she was going to take it to a higher level.

## 2014-05-26 ENCOUNTER — Ambulatory Visit: Payer: Medicare Other | Admitting: Psychology

## 2014-05-27 ENCOUNTER — Other Ambulatory Visit: Payer: Self-pay | Admitting: Family Medicine

## 2014-06-01 ENCOUNTER — Telehealth: Payer: Self-pay | Admitting: *Deleted

## 2014-06-01 NOTE — Telephone Encounter (Signed)
Pt called and stated that she will need a letter for school that states the nature of her condition, Dx and how it impacts her over all life and academic abilities  to complete tasks e.g. Homework, test taking, turning in assignments. Because of this she is asking for a recommendation to be allowed extra time for assignments, discussion groups, homework, tests. Ect..  Pt really works hard at making every effort to stay on task. And this would be a great help to her to stay motivated in completing her education.Diane [name] Diane [name]]

## 2014-06-02 ENCOUNTER — Other Ambulatory Visit: Payer: Self-pay | Admitting: Family Medicine

## 2014-06-02 ENCOUNTER — Encounter: Payer: Self-pay | Admitting: *Deleted

## 2014-06-05 ENCOUNTER — Other Ambulatory Visit: Payer: Self-pay | Admitting: Family Medicine

## 2014-06-08 ENCOUNTER — Other Ambulatory Visit: Payer: Self-pay | Admitting: Family Medicine

## 2014-06-14 DIAGNOSIS — E039 Hypothyroidism, unspecified: Secondary | ICD-10-CM | POA: Diagnosis not present

## 2014-06-14 DIAGNOSIS — E282 Polycystic ovarian syndrome: Secondary | ICD-10-CM | POA: Diagnosis not present

## 2014-06-16 ENCOUNTER — Telehealth: Payer: Self-pay

## 2014-06-16 NOTE — Telephone Encounter (Signed)
Patient's husband called and left a message requesting a referral to cardiology for him and his wife.

## 2014-06-17 ENCOUNTER — Ambulatory Visit: Payer: Medicare Other | Admitting: Psychology

## 2014-06-17 NOTE — Telephone Encounter (Signed)
Please call patient find out why she needs referral to cardiology. I look back at the last several office visits and I really did not document a remember her talking about any type of cardiac issues.

## 2014-06-24 NOTE — Telephone Encounter (Signed)
Left detailed message.   

## 2014-06-25 ENCOUNTER — Telehealth: Payer: Self-pay | Admitting: *Deleted

## 2014-06-25 ENCOUNTER — Other Ambulatory Visit: Payer: Self-pay | Admitting: Family Medicine

## 2014-06-25 ENCOUNTER — Other Ambulatory Visit: Payer: Self-pay

## 2014-06-25 ENCOUNTER — Ambulatory Visit: Payer: Medicare Other | Admitting: Family Medicine

## 2014-06-25 MED ORDER — DEPLIN 15 15-90.314 MG PO CAPS: 1.0000 | ORAL_CAPSULE | Freq: Every day | ORAL | Status: DC

## 2014-06-25 MED ORDER — CEREFOLIN NAC 6-90.314-2-600 MG PO TABS: 1.0000 | ORAL_TABLET | Freq: Every day | ORAL | Status: DC

## 2014-06-25 NOTE — Telephone Encounter (Signed)
Medication faxed

## 2014-06-25 NOTE — Telephone Encounter (Signed)
Diane [name]  Patient called in this morning 3/4 saying she needs refills on her clonazepam & that she found another place that would cover her Cerefolin & Deplin 15. She left a fax # [phone] & Rx code [id number].

## 2014-06-25 NOTE — Telephone Encounter (Signed)
Patient cakk this morning

## 2014-06-28 NOTE — Telephone Encounter (Signed)
Patient will call her cardiologist.

## 2014-07-09 ENCOUNTER — Ambulatory Visit: Payer: Medicare Other | Admitting: Family Medicine

## 2014-07-14 DIAGNOSIS — G43709 Chronic migraine without aura, not intractable, without status migrainosus: Secondary | ICD-10-CM | POA: Diagnosis not present

## 2014-07-15 ENCOUNTER — Ambulatory Visit (INDEPENDENT_AMBULATORY_CARE_PROVIDER_SITE_OTHER): Payer: 59 | Admitting: Family Medicine

## 2014-07-15 ENCOUNTER — Encounter: Payer: Self-pay | Admitting: Family Medicine

## 2014-07-15 VITALS — BP 139/87 | HR 94 | Ht 63.0 in | Wt 215.0 lb

## 2014-07-15 DIAGNOSIS — G43009 Migraine without aura, not intractable, without status migrainosus: Secondary | ICD-10-CM

## 2014-07-15 DIAGNOSIS — G4700 Insomnia, unspecified: Secondary | ICD-10-CM | POA: Diagnosis not present

## 2014-07-15 DIAGNOSIS — I10 Essential (primary) hypertension: Secondary | ICD-10-CM

## 2014-07-15 DIAGNOSIS — Z5181 Encounter for therapeutic drug level monitoring: Secondary | ICD-10-CM

## 2014-07-15 DIAGNOSIS — F411 Generalized anxiety disorder: Secondary | ICD-10-CM

## 2014-07-15 NOTE — Progress Notes (Signed)
   Subjective:    Patient ID: Diane [name], female    DOB: Nov 23, 1970, 44 y.o.   MRN: [id number]  HPI Hypertension- Says her BP does seem to go up and down.  BP was 127/73 the other day.  Hasn't really been exercising.  In fact she's gained about 10 pounds since last time she was here. She says she doesn't eat often when she does it mostly been junk food.    She has found a new therapist.  Roney Jaffe has been a few times and does like her. Says used to see therapist once a week.  She is very vigilant bout hr medications. She still has not found a new psychiatrist.  Not sleeping well for years.  Says will often wake up after a few hours. Feels like her bedroom is too hot.  Plans on getting a air conditioning window units. She is living with her parents. She does have her TV in the bedroom but that's because she's living and her parents home and that her only that is private. Review of Systems     Objective:   Physical Exam  Constitutional: She is oriented to person, place, and time. She appears well-developed and well-nourished.  HENT:  Head: Normocephalic and atraumatic.  Cardiovascular: Normal rate, regular rhythm and normal heart sounds.   Pulmonary/Chest: Effort normal and breath sounds normal.  Neurological: She is alert and oriented to person, place, and time.  Skin: Skin is warm and dry.  Psychiatric: She has a normal mood and affect. Her behavior is normal.          Assessment & Plan:  BMI 38 -discussed the importance of diet and weight loss and getting back on track.  Hypertension-borderline today though it sounds like she's had some good blood pressures recently. Discussed the importance of getting back on track with healthy diet and regular exercise and weight loss. She was doing well for a while.   Bipolar disorder-encouraged her to stick with her current therapist for a while and continue to see if it goes well. Strongly encouraged her to try to find a new  psychiatrist. Evidently where I had referred her they are not taking new patients. Due to check Tegretol level.  Insomnia-says has tried multiple sleep medications over the years and has not found them helpful. We discussed non-sleep strategies to help her get a better nights rest.  Time spent 30 minutes, greater 50% time spent counseling about mood, sleep, blood pressure and weight and diet.

## 2014-07-22 ENCOUNTER — Other Ambulatory Visit: Payer: Self-pay | Admitting: *Deleted

## 2014-07-23 ENCOUNTER — Telehealth: Payer: Self-pay | Admitting: *Deleted

## 2014-07-23 NOTE — Telephone Encounter (Signed)
Pt requests refill on her clonazepam.I had advised her yesterday that it was due on 07/26/14 since it was last written on 06/25/14. She states she had " gotten behind" on her last rx. I didn't understand what she meant by this but I again told her that its not due until Monday. At that time she did say "ok if you want to wait unitl then that is fine." Pt also wanted me to ask Dr. Madilyn Fireman if she "heard anything from her doctor friend." I asked if she was speaking about a referral and she states " Dr. Madilyn Fireman  Would know what I am talking about. I think pt is referring to a psych referral just looking back at the last note. I did encourage her to stick with her current doctor if this was what she was referring to. She wanted me to rout a note to Dr. Madilyn Fireman.

## 2014-07-26 ENCOUNTER — Other Ambulatory Visit: Payer: Self-pay | Admitting: Family Medicine

## 2014-07-26 MED ORDER — CLONAZEPAM 1 MG PO TABS: 1.0000 mg | ORAL_TABLET | Freq: Two times a day (BID) | ORAL | Status: DC

## 2014-07-27 ENCOUNTER — Telehealth: Payer: Self-pay | Admitting: Family Medicine

## 2014-07-27 NOTE — Telephone Encounter (Signed)
Received Pa on Horizant 600mg  Patients insurance has changed and we need copy of new card. Tried to call and the phone does not except incoming calls. - CF

## 2014-07-28 ENCOUNTER — Other Ambulatory Visit: Payer: Self-pay | Admitting: *Deleted

## 2014-07-28 MED ORDER — HYDROXYZINE PAMOATE 25 MG PO CAPS: ORAL_CAPSULE | ORAL | Status: DC

## 2014-07-28 NOTE — Telephone Encounter (Signed)
I would recommend Dr. Loni Dolly. He works for [employer].

## 2014-07-28 NOTE — Telephone Encounter (Signed)
Dr. Nyra Market himself left a message on my vm a few months ago saying that they were not accepting any patients outside of [facility] because there was such a back log

## 2014-07-30 ENCOUNTER — Other Ambulatory Visit: Payer: Self-pay | Admitting: Family Medicine

## 2014-08-02 NOTE — Telephone Encounter (Signed)
I really don't have any other suggestions.  She may want to call her insurance and start with seeing who is in her network.

## 2014-08-03 ENCOUNTER — Telehealth: Payer: Self-pay | Admitting: *Deleted

## 2014-08-03 NOTE — Telephone Encounter (Signed)
Called pt to let her know Dr. Madilyn Fireman didn't have any reccomendations about psych referral.Pt then asks me about her medications stating her insurance will no longer cover synthroid and hydroxizine and other medications and that she has been out of medication for 5 days. I asked if the medications needed a PA that she knew of and she states that they didn't need a PA but we had to fill out a form to appeal the decision. I think she may have been confused and I was unclear as to what she wanted me to do and what she was asking. I did check in the PA folder and I didn't see anything pending. I didn't see anything in cover my meds either. Jenny Reichmann can you check on this when you get a chance

## 2014-08-05 ENCOUNTER — Other Ambulatory Visit: Payer: Self-pay | Admitting: Family Medicine

## 2014-08-05 MED ORDER — CARBAMAZEPINE ER 200 MG PO TB12: 600.0000 mg | ORAL_TABLET | Freq: Every day | ORAL | Status: DC

## 2014-08-05 NOTE — Telephone Encounter (Signed)
Pt returned clinic all, advised we need her current insurance card. Pt advised she was unaware of any change. Informed her to call the insurance company and see if they had any information for her so we can proceed with the PA's on file. Verbalized understanding.

## 2014-08-05 NOTE — Telephone Encounter (Signed)
Waiting on patient to call back with insurance information so I can initiate the prior auth. - CF

## 2014-08-21 ENCOUNTER — Other Ambulatory Visit: Payer: Self-pay | Admitting: Family Medicine

## 2014-09-11 ENCOUNTER — Emergency Department (INDEPENDENT_AMBULATORY_CARE_PROVIDER_SITE_OTHER)
Admission: EM | Admit: 2014-09-11 | Discharge: 2014-09-11 | Disposition: A | Discharge status: Home / Self Care | Payer: 59 | Source: Home / Self Care | Attending: Family Medicine | Admitting: Family Medicine

## 2014-09-11 ENCOUNTER — Encounter: Payer: Self-pay | Admitting: Emergency Medicine

## 2014-09-11 DIAGNOSIS — G43701 Chronic migraine without aura, not intractable, with status migrainosus: Secondary | ICD-10-CM

## 2014-09-11 MED ORDER — ZOLMITRIPTAN 5 MG NA SOLN: NASAL | Status: DC

## 2014-09-11 MED ORDER — ONDANSETRON HCL 4 MG/2ML IJ SOLN
2.0000 mg | Freq: Once | INTRAMUSCULAR | Status: AC
  Administered 2014-09-11: 2 mg via INTRAMUSCULAR

## 2014-09-11 MED ORDER — ZOLMITRIPTAN 2.5 MG NA SOLN: 1.0000 | Freq: Every day | NASAL | Status: DC | PRN

## 2014-09-11 NOTE — ED Provider Notes (Signed)
CSN: [id number]     Arrival date & time 09/11/14  1645 History   First MD Initiated Contact with Patient 09/11/14 1741     Chief Complaint  Patient presents with  . Migraine      HPI Comments: Patient has a long history of daily headaches.  One week ago her usual daily headache became worse, transforming yesterday and today into a typical migraine without localizing neurologic symptoms.  She has had nausea with occasional vomiting.  She reports that Zomig has worked well in the past, and she requests refill.  Patient is a 44 y.o. female presenting with migraines. The history is provided by the patient.  Migraine The current episode started more than 2 days ago. The problem occurs constantly. The problem has been gradually worsening. Associated symptoms include headaches. The symptoms are aggravated by exertion. Nothing relieves the symptoms. She has tried nothing for the symptoms.    Past Medical History  Diagnosis Date  . Anxiety   . Bipolar 1 disorder     rapid cycler-- Dr Yehuda Budd  . Hypertension   . Hypothyroidism     Dr Starleen Blue (endo)  . Water retention   . GERD (gastroesophageal reflux disease)     GI Dr Percell Miller  . Interstitial cystitis   . Migraine   . Pulmonary embolism 01/25/11  . Polycystic ovarian disease   . Peptic ulcer disease   . Lupus   . Fibromyalgia    Past Surgical History  Procedure Laterality Date  . Cholecystectomy  2004   Family History  Problem Relation Age of Onset  . Anxiety disorder Other   . Depression Other   . Hypertension Father   . Diabetes Mother   . Urolithiasis Father    History  Substance Use Topics  . Smoking status: Never Smoker   . Smokeless tobacco: Never Used  . Alcohol Use: Yes     Comment: rarely   OB History    Gravida Para Term Preterm AB TAB SAB Ectopic Multiple Living   0 0             Review of Systems  Constitutional: Positive for appetite change and fatigue. Negative for fever, chills and diaphoresis.  HENT:  Negative for congestion, ear pain, rhinorrhea, sinus pressure, sore throat and trouble swallowing.   Eyes: Negative.  Negative for photophobia.  Respiratory: Negative.   Cardiovascular: Negative.   Gastrointestinal: Positive for nausea and vomiting.  Genitourinary: Negative.   Musculoskeletal: Negative.   Neurological: Positive for headaches. Negative for dizziness, syncope, facial asymmetry, speech difficulty, weakness, light-headedness and numbness.  All other systems reviewed and are negative.   Allergies  Eletriptan; Lisinopril; Relpax; and Zyprexa  Home Medications   Prior to Admission medications   Medication Sig Start Date End Date Taking? Authorizing Provider  AMBULATORY NON FORMULARY MEDICATION Medication Name: CPAP machine. ResMed Ultra mirage style full face mask with heated humification and titrated to 10 cm H20. Diagnosis:OSA. See attached report 08/25/13   Hali Marry, MD  aspirin 325 MG tablet Take 325 mg by mouth daily.    Historical Provider, MD  carbamazepine (TEGRETOL-XR) 200 MG 12 hr tablet Take 3 tablets (600 mg total) by mouth at bedtime. 08/05/14   Hali Marry, MD  clonazePAM (KLONOPIN) 1 MG tablet TAKE 1 TABLET BY MOUTH TWICE DAILY 08/23/14   Donella Stade, PA-C  DEXILANT 60 MG capsule  04/03/14   Historical Provider, MD  Dietary Management Product (PURALOR CI) TBCR Take 1  tablet by mouth daily. 03/30/14   Hali Marry, MD  DUEXIS 800-26.6 MG TABS  01/14/14   Historical Provider, MD  esomeprazole (NEXIUM) 40 MG capsule TAKE 1 CAPSULE BY MOUTH EVERY DAY 05/25/14   Historical Provider, MD  ferrous sulfate 325 (65 FE) MG tablet TAKE 1 TABLET BY MOUTH EVERY DAY 01/25/14   Hali Marry, MD  Gabapentin Enacarbil 600 MG TBCR Take 600 mg by mouth daily. 03/30/14   Hali Marry, MD  hydroquinone 4 % cream Apply topically 2 (two) times daily. 05/04/14   Hali Marry, MD  hydrOXYzine (VISTARIL) 25 MG capsule TAKE 2 CAPSULES BY MOUTH AT  BEDTIME AS NEEDED 07/28/14   Hali Marry, MD  L-Methylfolate-Algae (DEPLIN 15) [phone] MG CAPS Take 1 capsule by mouth daily. 06/25/14   Hali Marry, MD  levothyroxine (SYNTHROID, LEVOTHROID) 25 MCG tablet Take 37.5 mcg by mouth. 06/17/14   Historical Provider, MD  metFORMIN (GLUCOPHAGE-XR) 500 MG 24 hr tablet Take 500 mg by mouth daily with breakfast. 01/23/14   Historical Provider, MD  Methylfol-Algae-B12-Acetylcyst (CEREFOLIN NAC) 6-90.314-2-600 MG TABS Take 1 tablet by mouth daily. 06/25/14   Hali Marry, MD  ondansetron (ZOFRAN-ODT) 8 MG disintegrating tablet DISSOLVE ONE TABLET BY MOUTH EVERY 8 HOURS AS NEEDED FOR NAUSEA 08/23/14   Jade L Breeback, PA-C  Promethazine HCl 6.25 MG/5ML SOLN TAKE 20 MLS BY MOUTH EVERY 6 HOURS AS NEEDED FOR NAUSEA OR VOMITING 12/22/13   Hali Marry, MD  traMADol Veatrice Bourbon) 50 MG tablet  04/03/14   Historical Provider, MD  verapamil (VERELAN PM) 240 MG 24 hr capsule Take 1 capsule (240 mg total) by mouth at bedtime. 03/30/14   Hali Marry, MD  zolmitriptan (ZOMIG) 5 MG nasal solution Place one spray in one nostril for migraine headache.  May repeat once after 2 hours.  Maximum of two doses in 24 hours 09/11/14   Kandra Nicolas, MD   BP 119/80 mmHg  Pulse 97  Temp(Src) 98.7 F (37.1 C) (Oral)  Resp 16  Ht 5\' 4"  (1.626 m)  Wt 209 lb (94.802 kg)  BMI 35.86 kg/m2  SpO2 97% Physical Exam Nursing notes and Vital Signs reviewed. Appearance:  Patient appears stated age, and in no acute distress.  Patient is obese (BMI 35.9) Eyes:  Pupils are equal, round, and reactive to light and accomodation.  Extraocular movement is intact.  Conjunctivae are not inflamed.  Fundi benign.  No photophobia  Ears:  Canals normal.  Tympanic membranes normal.  Nose:   Normal turbinates.  No sinus tenderness.   Pharynx:  Normal Neck:  Supple.  No adenopathy Lungs:  Clear to auscultation.  Breath sounds are equal.  Heart:  Regular rate and rhythm  without murmurs, rubs, or gallops.  Abdomen:  Nontender   Extremities:  No edema.  No calf tenderness Skin:  No rash present. Neurologic:  Cranial nerves 2 through 12 are normal.  Patellar, achilles, and elbow reflexes are normal.  Cerebellar function is intact (finger-to-nose and rapid alternating hand movement).  Gait and station are normal.   ED Course  Procedures  none  MDM    Chronic migraine without aura with status migrainosus, not intractable     Zofran 4mg  IM. Rx for Zomig 5mg  nasal spray. Followup with Family Doctor if not improved in 2 days.    Kandra Nicolas, MD 09/13/14 [phone]

## 2014-09-11 NOTE — ED Notes (Signed)
Reports chronic migraines with exacerbation now she is close to needing another botox injection. She is requesting rx for Zomig which works well for her.

## 2014-09-11 NOTE — Discharge Instructions (Signed)
Recurrent Migraine Headache A migraine headache is an intense, throbbing pain on one or both sides of your head. Recurrent migraines keep coming back. A migraine can last for 30 minutes to several hours. CAUSES  The exact cause of a migraine headache is not always known. However, a migraine may be caused when nerves in the brain become irritated and release chemicals that cause inflammation. This causes pain. Certain things may also trigger migraines, such as:   Alcohol.  Smoking.  Stress.  Menstruation.  Aged cheeses.  Foods or drinks that contain nitrates, glutamate, aspartame, or tyramine.  Lack of sleep.  Chocolate.  Caffeine.  Hunger.  Physical exertion.  Fatigue.  Medicines used to treat chest pain (nitroglycerine), birth control pills, estrogen, and some blood pressure medicines. SYMPTOMS   Pain on one or both sides of your head.  Pulsating or throbbing pain.  Severe pain that prevents daily activities.  Pain that is aggravated by any physical activity.  Nausea, vomiting, or both.  Dizziness.  Pain with exposure to bright lights, loud noises, or activity.  General sensitivity to bright lights, loud noises, or smells. Before you get a migraine, you may get warning signs that a migraine is coming (aura). An aura may include:  Seeing flashing lights.  Seeing bright spots, halos, or zigzag lines.  Having tunnel vision or blurred vision.  Having feelings of numbness or tingling.  Having trouble talking.  Having muscle weakness. DIAGNOSIS  A recurrent migraine headache is often diagnosed based on:  Symptoms.  Physical examination.  A CT scan or MRI of your head. These imaging tests cannot diagnose migraines but can help rule out other causes of headaches.  TREATMENT  Medicines may be given for pain and nausea. Medicines can also be given to help prevent recurrent migraines. HOME CARE INSTRUCTIONS  Only take over-the-counter or prescription  medicines for pain or discomfort as directed by your health care provider. The use of long-term narcotics is not recommended.  Lie down in a dark, quiet room when you have a migraine.  Keep a journal to find out what may trigger your migraine headaches. For example, write down:  What you eat and drink.  How much sleep you get.  Any change to your diet or medicines.  Limit alcohol consumption.  Quit smoking if you smoke.  Get 7-9 hours of sleep, or as recommended by your health care provider.  Limit stress.  Keep lights dim if bright lights bother you and make your migraines worse. SEEK MEDICAL CARE IF:   You do not get relief from the medicines given to you.  You have a recurrence of pain.  You have a fever. SEEK IMMEDIATE MEDICAL CARE IF:  Your migraine becomes severe.  You have a stiff neck.  You have loss of vision.  You have muscular weakness or loss of muscle control.  You start losing your balance or have trouble walking.  You feel faint or pass out.  You have severe symptoms that are different from your first symptoms. MAKE SURE YOU:   Understand these instructions.  Will watch your condition.  Will get help right away if you are not doing well or get worse. Document Released: 01/02/2001 Document Revised: 08/24/2013 Document Reviewed: 12/15/2012 ExitCare Patient Information 2015 [employer]. This information is not intended to replace advice given to you by your health care provider. Make sure you discuss any questions you have with your health care provider.  

## 2014-09-13 NOTE — Telephone Encounter (Signed)
Received letter from [employer] today and they have approved Horizant from 06/15/2014 - 09/13/2015. - CF

## 2014-09-14 ENCOUNTER — Other Ambulatory Visit: Payer: Self-pay | Admitting: *Deleted

## 2014-09-15 ENCOUNTER — Other Ambulatory Visit: Payer: Self-pay | Admitting: Family Medicine

## 2014-09-15 MED ORDER — ZOLMITRIPTAN 5 MG PO TABS: 5.0000 mg | ORAL_TABLET | ORAL | Status: DC | PRN

## 2014-09-16 ENCOUNTER — Telehealth: Payer: Self-pay | Admitting: Family Medicine

## 2014-09-16 NOTE — Telephone Encounter (Signed)
Called [employer] regarding VISTARIL Rx, was informed by Phillips Odor that Rx had already been approved. Auth #: [id number] valid until 09/13/15.  Called [facility] pharmacy where Rx was supposed to be sent, spoke with Bray. She did not show the Rx in their system. Gave verbal order and provided auth information.

## 2014-09-20 ENCOUNTER — Other Ambulatory Visit: Payer: Self-pay | Admitting: Physician Assistant

## 2014-09-21 ENCOUNTER — Telehealth: Payer: Self-pay | Admitting: *Deleted

## 2014-09-21 NOTE — Telephone Encounter (Signed)
Pt called and lvm asking for refill of klonipin. I looked back on her med list and this was refilled today. I called pt back to inform her of this and her phone is not accepting  Any vm.Audelia Hives [place]

## 2014-10-01 ENCOUNTER — Ambulatory Visit: Payer: Medicare Other | Admitting: Family Medicine

## 2014-10-05 ENCOUNTER — Other Ambulatory Visit: Payer: Self-pay | Admitting: *Deleted

## 2014-10-05 ENCOUNTER — Other Ambulatory Visit: Payer: Self-pay | Admitting: Family Medicine

## 2014-10-05 MED ORDER — CARBAMAZEPINE ER 200 MG PO TB12: 600.0000 mg | ORAL_TABLET | Freq: Every day | ORAL | Status: DC

## 2014-10-11 DIAGNOSIS — E669 Obesity, unspecified: Secondary | ICD-10-CM | POA: Diagnosis not present

## 2014-10-11 DIAGNOSIS — R5383 Other fatigue: Secondary | ICD-10-CM | POA: Diagnosis not present

## 2014-10-11 DIAGNOSIS — M2550 Pain in unspecified joint: Secondary | ICD-10-CM | POA: Diagnosis not present

## 2014-10-11 DIAGNOSIS — R768 Other specified abnormal immunological findings in serum: Secondary | ICD-10-CM | POA: Diagnosis not present

## 2014-10-11 DIAGNOSIS — M797 Fibromyalgia: Secondary | ICD-10-CM | POA: Diagnosis not present

## 2014-10-12 LAB — CBC
HEMATOCRIT: 38.7 % (ref 36.0–46.0)
Hemoglobin: 13.2 g/dL (ref 12.0–15.0)
MCH: 27.2 pg (ref 26.0–34.0)
MCHC: 34.1 g/dL (ref 30.0–36.0)
MCV: 79.8 fL (ref 78.0–100.0)
MPV: 9.9 fL (ref 8.6–12.4)
Platelets: 264 10*3/uL (ref 150–400)
RBC: 4.85 MIL/uL (ref 3.87–5.11)
RDW: 14.1 % (ref 11.5–15.5)
WBC: 10 10*3/uL (ref 4.0–10.5)

## 2014-10-12 LAB — CARBAMAZEPINE LEVEL, TOTAL: CARBAMAZEPINE LVL: 7.8 ug/mL (ref 4.0–12.0)

## 2014-10-14 ENCOUNTER — Ambulatory Visit (INDEPENDENT_AMBULATORY_CARE_PROVIDER_SITE_OTHER): Payer: 59 | Admitting: Family Medicine

## 2014-10-14 ENCOUNTER — Encounter: Payer: Self-pay | Admitting: Family Medicine

## 2014-10-14 VITALS — BP 147/98 | HR 101 | Ht 64.0 in | Wt 223.0 lb

## 2014-10-14 DIAGNOSIS — E039 Hypothyroidism, unspecified: Secondary | ICD-10-CM

## 2014-10-14 DIAGNOSIS — F411 Generalized anxiety disorder: Secondary | ICD-10-CM

## 2014-10-14 DIAGNOSIS — F319 Bipolar disorder, unspecified: Secondary | ICD-10-CM

## 2014-10-14 DIAGNOSIS — I10 Essential (primary) hypertension: Secondary | ICD-10-CM | POA: Diagnosis not present

## 2014-10-14 DIAGNOSIS — E785 Hyperlipidemia, unspecified: Secondary | ICD-10-CM | POA: Diagnosis not present

## 2014-10-14 DIAGNOSIS — R635 Abnormal weight gain: Secondary | ICD-10-CM

## 2014-10-14 DIAGNOSIS — L819 Disorder of pigmentation, unspecified: Secondary | ICD-10-CM

## 2014-10-14 MED ORDER — HYDROQUINONE 4 % EX CREA: TOPICAL_CREAM | Freq: Two times a day (BID) | CUTANEOUS | Status: DC

## 2014-10-14 MED ORDER — TEGRETOL-XR 200 MG PO TB12: 600.0000 mg | ORAL_TABLET | Freq: Every day | ORAL | Status: DC

## 2014-10-14 NOTE — Progress Notes (Signed)
Subjective:    Patient ID: Diane [name], female    DOB: 1970-05-06, 44 y.o.   MRN: [id number]  HPI Hypertension- Pt denies chest pain, SOB, dizziness, or heart palpitations.  Taking meds as directed w/o problems.  Denies medication side effects.    Hypothyroid - Says plans on calling her endocrinologist.  They increased her thyroid medicine recently.   Bipolar 1 Seeing Dr. Sigmund Hazel for brain mapping. . She is seeing a new therapist but hasn't found a Psychiatrist yet. She is having problems finding someone who takes her insurance. Has felt more apethic lately. Just not motivated.  Says if she does start to feel her mood elevate she willtry to stay home.    She would like a refill on her hydroquinone for hyperpigmentation on her face. Has been using her susncreen regularly.   Review of Systems  BP 147/98 mmHg  Pulse 101  Ht 5\' 4"  (1.626 m)  Wt 223 lb (101.152 kg)  BMI 38.26 kg/m2    Allergies  Allergen Reactions  . Eletriptan Nausea And Vomiting  . Lisinopril Other (See Comments)    Cough   . Relpax [Eletriptan Hydrobromide] Nausea And Vomiting  . Zyprexa [Olanzapine] Other (See Comments)    Hallucinations    Past Medical History  Diagnosis Date  . Anxiety   . Bipolar 1 disorder     rapid cycler-- Dr Yehuda Budd  . Hypertension   . Hypothyroidism     Dr Starleen Blue ([facility])  . Water retention   . GERD (gastroesophageal reflux disease)     GI Dr Percell Miller  . Interstitial cystitis   . Migraine   . Pulmonary embolism 01/25/11  . Polycystic ovarian disease   . Peptic ulcer disease   . Lupus   . Fibromyalgia     Past Surgical History  Procedure Laterality Date  . Cholecystectomy  2004    History   Social History  . Marital Status: Married    Spouse Name: N/A  . Number of Children: N/A  . Years of Education: N/A   Occupational History  . Not on file.   Social History Main Topics  . Smoking status: Never Smoker   . Smokeless tobacco: Never Used  . Alcohol  Use: Yes     Comment: rarely  . Drug Use: No  . Sexual Activity: No   Other Topics Concern  . Not on file   Social History Narrative    Family History  Problem Relation Age of Onset  . Anxiety disorder Other   . Depression Other   . Hypertension Father   . Diabetes Mother   . Urolithiasis Father     Outpatient Encounter Prescriptions as of 10/14/2014  Medication Sig  . levothyroxine (SYNTHROID, LEVOTHROID) 75 MCG tablet Take 35.7 mcg by mouth daily before breakfast.  . AMBULATORY NON FORMULARY MEDICATION Medication Name: CPAP machine. ResMed Ultra mirage style full face mask with heated humification and titrated to 10 cm H20. Diagnosis:OSA. See attached report  . aspirin 325 MG tablet Take 325 mg by mouth daily.  . clonazePAM (KLONOPIN) 1 MG tablet TAKE 1 TABLET BY MOUTH TWICE DAILY  . DEXILANT 60 MG capsule   . Dietary Management Product (PURALOR CI) TBCR Take 1 tablet by mouth daily. (Patient not taking: Reported on 10/14/2014)  . DUEXIS 800-26.6 MG TABS   . esomeprazole (NEXIUM) 40 MG capsule TAKE 1 CAPSULE BY MOUTH EVERY DAY  . Gabapentin Enacarbil 600 MG TBCR Take 600 mg by mouth daily.  Marland Kitchen  hydroquinone 4 % cream Apply topically 2 (two) times daily.  . hydrOXYzine (VISTARIL) 25 MG capsule TAKE 2 CAPSULES BY MOUTH AT BEDTIME AS NEEDED  . L-Methylfolate-Algae (DEPLIN 15) 15-90.314 MG CAPS Take 1 capsule by mouth daily.  . metFORMIN (GLUCOPHAGE-XR) 500 MG 24 hr tablet Take 500 mg by mouth daily with breakfast.  . Methylfol-Algae-B12-Acetylcyst (CEREFOLIN NAC) 6-90.314-2-600 MG TABS Take 1 tablet by mouth daily.  . ondansetron (ZOFRAN-ODT) 8 MG disintegrating tablet DISSOLVE ONE TABLET BY MOUTH EVERY 8 HOURS AS NEEDED FOR NAUSEA  . Promethazine HCl 6.25 MG/5ML SOLN TAKE 20 MLS BY MOUTH EVERY 6 HOURS AS NEEDED FOR NAUSEA OR VOMITING  . TEGRETOL-XR 200 MG 12 hr tablet Take 3 tablets (600 mg total) by mouth at bedtime.  . traMADol (ULTRAM) 50 MG tablet   . verapamil (VERELAN PM)  240 MG 24 hr capsule Take 1 capsule (240 mg total) by mouth at bedtime.  Marland Kitchen zolmitriptan (ZOMIG) 5 MG tablet Take 1 tablet (5 mg total) by mouth as needed for migraine.  . [DISCONTINUED] carbamazepine (TEGRETOL-XR) 200 MG 12 hr tablet Take 3 tablets (600 mg total) by mouth at bedtime.  . [DISCONTINUED] ferrous sulfate 325 (65 FE) MG tablet TAKE 1 TABLET BY MOUTH EVERY DAY  . [DISCONTINUED] hydroquinone 4 % cream Apply topically 2 (two) times daily.  . [DISCONTINUED] levothyroxine (SYNTHROID, LEVOTHROID) 25 MCG tablet Take 37.5 mcg by mouth.   No facility-administered encounter medications on file as of 10/14/2014.          Objective:   Physical Exam  Constitutional: She is oriented to person, place, and time. She appears well-developed and well-nourished.  HENT:  Head: Normocephalic and atraumatic.  Cardiovascular: Normal rate, regular rhythm and normal heart sounds.   Pulmonary/Chest: Effort normal and breath sounds normal.  Neurological: She is alert and oriented to person, place, and time.  Skin: Skin is warm and dry.  Psychiatric: She has a normal mood and affect. Her behavior is normal.          Assessment & Plan:  HTN- Uncontrolled, most likely from recent weight gain work on getting back on track with diet and exercise. Continue current regimen.  F/u in 3 months. If not at goal by then, we will adjust medication.   Abnormal weight gain - She would like to have a letter for the [facility] saying why she needs to exercise. She is trying ot get into a program there. Thyroid med was adjusted up recently.   Hypothyroid - now on new dose. Doing well.    Bipolar 1 - refer to psych.  We called Dr. Alton Revere office but they don't take her insurance.  Encouraged her to talk with her therapist and see if she has any recommendations.   Hyperpigmentation on face - restart hydroquinone.   Has gyn appt in July.  Due for pap smear.    Time spent 40 min, >50% spent counseling about BP,  weight gain, thyroid, skin discoloration adn her Bipolar D/O

## 2014-10-15 ENCOUNTER — Encounter: Payer: Self-pay | Admitting: Family Medicine

## 2014-10-18 DIAGNOSIS — M79644 Pain in right finger(s): Secondary | ICD-10-CM | POA: Diagnosis not present

## 2014-10-18 DIAGNOSIS — Y33XXXA Other specified events, undetermined intent, initial encounter: Secondary | ICD-10-CM | POA: Diagnosis not present

## 2014-10-18 DIAGNOSIS — S63230A Subluxation of proximal interphalangeal joint of right index finger, initial encounter: Secondary | ICD-10-CM | POA: Diagnosis not present

## 2014-10-19 ENCOUNTER — Other Ambulatory Visit: Payer: Self-pay | Admitting: Family Medicine

## 2014-10-19 DIAGNOSIS — G43709 Chronic migraine without aura, not intractable, without status migrainosus: Secondary | ICD-10-CM | POA: Diagnosis not present

## 2014-10-20 ENCOUNTER — Telehealth: Payer: Self-pay | Admitting: Family Medicine

## 2014-10-20 NOTE — Telephone Encounter (Signed)
Received fax for prior authorization on Hydroquinone 4% sent through cover my meds and waiting on authorization. - CF

## 2014-10-26 NOTE — Telephone Encounter (Signed)
Received fax from [place] stating Hydroquinone is a plan exclusion and there is no coverage criteria to review and apply. - CF

## 2014-10-28 ENCOUNTER — Encounter: Payer: Self-pay | Admitting: Family Medicine

## 2014-10-28 DIAGNOSIS — M20021 Boutonniere deformity of right finger(s): Secondary | ICD-10-CM | POA: Insufficient documentation

## 2014-10-29 ENCOUNTER — Telehealth: Payer: Self-pay | Admitting: *Deleted

## 2014-10-29 NOTE — Telephone Encounter (Signed)
Pt left vm stating that she needs multiple things. One, she needs a letter for the [facility] that you guys had discussed at her last appt. Two, she stated that her bones are reabsorbing into themselves and needs an "osteo doctor". Three, the hydroquinone will not be covered by her ins and you had told her that you knew of another similar med that shouldn't cause a problem with her ins.

## 2014-11-01 NOTE — Telephone Encounter (Signed)
[place] for letter, may need to clarify for what for and exactly what she wants it to say.  Does she need referral to an orthopedist? As far as the hydroquinone she may want to talk with the pharmacist or her insurance and see if a different concentraion might be covered.

## 2014-11-02 ENCOUNTER — Other Ambulatory Visit: Payer: Self-pay | Admitting: *Deleted

## 2014-11-02 DIAGNOSIS — E611 Iron deficiency: Secondary | ICD-10-CM

## 2014-11-03 ENCOUNTER — Ambulatory Visit (HOSPITAL_BASED_OUTPATIENT_CLINIC_OR_DEPARTMENT_OTHER): Payer: 59 | Admitting: Family

## 2014-11-03 ENCOUNTER — Other Ambulatory Visit (HOSPITAL_BASED_OUTPATIENT_CLINIC_OR_DEPARTMENT_OTHER): Payer: 59

## 2014-11-03 VITALS — BP 127/89 | HR 91 | Temp 98.4°F | Resp 20 | Wt 224.4 lb

## 2014-11-03 DIAGNOSIS — E611 Iron deficiency: Secondary | ICD-10-CM

## 2014-11-03 DIAGNOSIS — Z86711 Personal history of pulmonary embolism: Secondary | ICD-10-CM

## 2014-11-03 LAB — CBC WITH DIFFERENTIAL (CANCER CENTER ONLY)
BASO#: 0 10*3/uL (ref 0.0–0.2)
BASO%: 0.2 % (ref 0.0–2.0)
EOS%: 0.7 % (ref 0.0–7.0)
Eosinophils Absolute: 0.1 10*3/uL (ref 0.0–0.5)
HCT: 37.2 % (ref 34.8–46.6)
HGB: 12.8 g/dL (ref 11.6–15.9)
LYMPH#: 2.7 10*3/uL (ref 0.9–3.3)
LYMPH%: 30.8 % (ref 14.0–48.0)
MCH: 27.7 pg (ref 26.0–34.0)
MCHC: 34.4 g/dL (ref 32.0–36.0)
MCV: 81 fL (ref 81–101)
MONO#: 0.8 10*3/uL (ref 0.1–0.9)
MONO%: 9.1 % (ref 0.0–13.0)
NEUT#: 5.2 10*3/uL (ref 1.5–6.5)
NEUT%: 59.2 % (ref 39.6–80.0)
PLATELETS: 250 10*3/uL (ref 145–400)
RBC: 4.62 10*6/uL (ref 3.70–5.32)
RDW: 12.8 % (ref 11.1–15.7)
WBC: 8.7 10*3/uL (ref 3.9–10.0)

## 2014-11-03 LAB — COMPREHENSIVE METABOLIC PANEL
ALBUMIN: 3.8 g/dL (ref 3.5–5.2)
ALT: 21 U/L (ref 0–35)
AST: 15 U/L (ref 0–37)
Alkaline Phosphatase: 88 U/L (ref 39–117)
BUN: 10 mg/dL (ref 6–23)
CHLORIDE: 100 meq/L (ref 96–112)
CO2: 24 meq/L (ref 19–32)
Calcium: 8.6 mg/dL (ref 8.4–10.5)
Creatinine, Ser: 0.78 mg/dL (ref 0.50–1.10)
GLUCOSE: 88 mg/dL (ref 70–99)
POTASSIUM: 3.7 meq/L (ref 3.5–5.3)
Sodium: 134 mEq/L — ABNORMAL LOW (ref 135–145)
Total Bilirubin: 0.2 mg/dL (ref 0.2–1.2)
Total Protein: 6.8 g/dL (ref 6.0–8.3)

## 2014-11-03 LAB — RETICULOCYTES (CHCC)
ABS Retic: 61 10*3/uL (ref 19.0–186.0)
RBC.: 4.69 MIL/uL (ref 3.87–5.11)
Retic Ct Pct: 1.3 % (ref 0.4–2.3)

## 2014-11-04 LAB — FERRITIN CHCC: FERRITIN: 25 ng/mL (ref 9–269)

## 2014-11-04 LAB — IRON AND TIBC CHCC
%SAT: 24 % (ref 21–57)
Iron: 85 ug/dL (ref 41–142)
TIBC: 359 ug/dL (ref 236–444)
UIBC: 273 ug/dL (ref 120–384)

## 2014-11-04 NOTE — Progress Notes (Signed)
Hematology and Oncology Follow Up Visit  Diane [name] [id number] 1970/09/18 44 y.o. 11/04/2014   Principle Diagnosis:  History of PE - October 2012  Current Therapy:   Follow-up as needed     Interim History: Diane [name] is here today with a c/o of a loss of cartilage in her right middle finger. She was concerned that this was somehow hematology related. She sever the tendon in that finger and have surgery. She is no longer able to bend it. I explained to her that this was not something we treat here. I did give her the contact information for Orthopedist Dr. Melrose Nakayama so she can schedule an appointment with him at her convenience.   She has no other complaints at this time.  She is followed closely by Rheumatology for her Lupus.  She has a lot of issues with fibromyalgia and joint pain. She has daily migraines and receives botox injections for this. She also has vertigo.  No fever, chills, n/v, cough, rash, SOB, chest pain, palpitations, abdominal pain, changes in bowel or bladder habits. No blood in urine or stool.  No swelling, tenderness, numbness or tingling in her extremities. No new aches or pains.  She is eating well and staying hydrated. Her weight is stable.   Medications:    Medication List       This list is accurate as of: 11/03/14 11:59 PM.  Always use your most recent med list.               AMBULATORY NON FORMULARY MEDICATION  Medication Name: CPAP machine. ResMed Ultra mirage style full face mask with heated humification and titrated to 10 cm H20. Diagnosis:OSA. See attached report     aspirin 325 MG tablet  Take 325 mg by mouth daily.     CEREFOLIN NAC 6-90.314-2-600 MG Tabs  Take 1 tablet by mouth daily.     clonazePAM 1 MG tablet  Commonly known as:  KLONOPIN  TAKE 1 TABLET BY MOUTH TWICE DAILY     DEPLIN 15 15-90.314 MG Caps  Take 1 capsule by mouth daily.     DUEXIS 800-26.6 MG Tabs  Generic drug:  Ibuprofen-Famotidine     esomeprazole  40 MG capsule  Commonly known as:  NEXIUM  TAKE 1 CAPSULE BY MOUTH EVERY DAY     Gabapentin Enacarbil 600 MG Tbcr  Take 600 mg by mouth daily.     hydroquinone 4 % cream  Apply topically 2 (two) times daily.     hydrOXYzine 25 MG capsule  Commonly known as:  VISTARIL  TAKE 2 CAPSULES BY MOUTH AT BEDTIME AS NEEDED     levothyroxine 25 MCG tablet  Commonly known as:  SYNTHROID, LEVOTHROID  Take 37.5 mcg by mouth daily before breakfast.     metFORMIN 500 MG 24 hr tablet  Commonly known as:  GLUCOPHAGE-XR  Take 500 mg by mouth daily with breakfast.     ondansetron 8 MG disintegrating tablet  Commonly known as:  ZOFRAN-ODT  DISSOLVE ONE TABLET BY MOUTH EVERY 8 HOURS AS NEEDED FOR NAUSEA     PURALOR CI Tbcr  Take 1 tablet by mouth daily.     TEGRETOL-XR 200 MG 12 hr tablet  Generic drug:  carbamazepine  Take 3 tablets (600 mg total) by mouth at bedtime.     traMADol 50 MG tablet  Commonly known as:  ULTRAM     verapamil 240 MG 24 hr capsule  Commonly known as:  VERELAN PM  Take 1 capsule (240 mg total) by mouth at bedtime.     ZOMIG 5 MG nasal solution  Generic drug:  zolmitriptan  5 mg.     zolmitriptan 5 MG tablet  Commonly known as:  ZOMIG  Take 1 tablet (5 mg total) by mouth as needed for migraine.        Allergies:  Allergies  Allergen Reactions  . Eletriptan Nausea And Vomiting  . Lisinopril Other (See Comments)    Cough   . Relpax [Eletriptan Hydrobromide] Nausea And Vomiting  . Zyprexa [Olanzapine] Other (See Comments)    Hallucinations    Past Medical History, Surgical history, Social history, and Family History were reviewed and updated.  Review of Systems: All other 10 point review of systems is negative.   Physical Exam:  weight is 224 lb 6.4 oz (101.787 kg). Her oral temperature is 98.4 F (36.9 C). Her blood pressure is 127/89 and her pulse is 91. Her respiration is 20.   Wt Readings from Last 3 Encounters:  11/03/14 224 lb 6.4 oz  (101.787 kg)  10/14/14 223 lb (101.152 kg)  09/11/14 209 lb (94.802 kg)    Ocular: Sclerae unicteric, pupils equal, round and reactive to light Ear-nose-throat: Oropharynx clear, dentition fair Lymphatic: No cervical or supraclavicular adenopathy Lungs no rales or rhonchi, good excursion bilaterally Heart regular rate and rhythm, no murmur appreciated Abd soft, nontender, positive bowel sounds MSK no focal spinal tenderness, no joint edema Neuro: non-focal, well-oriented, appropriate affect Breasts: Deferred  Lab Results  Component Value Date   WBC 8.7 11/03/2014   HGB 12.8 11/03/2014   HCT 37.2 11/03/2014   MCV 81 11/03/2014   PLT 250 11/03/2014   Lab Results  Component Value Date   FERRITIN 18 07/05/2011   IRON 177* 07/05/2011   Lab Results  Component Value Date   RETICCTPCT 1.3 11/03/2014   RBC 4.62 11/03/2014   RBC 4.69 11/03/2014   RETICCTABS 61.0 11/03/2014   No results found for: KPAFRELGTCHN, LAMBDASER, KAPLAMBRATIO No results found for: IGGSERUM, IGA, IGMSERUM No results found for: Ronnald Ramp, A1GS, A2GS, Tillman Sers, SPEI   Chemistry      Component Value Date/Time   NA 134* 11/03/2014 1451   K 3.7 11/03/2014 1451   CL 100 11/03/2014 1451   CO2 24 11/03/2014 1451   BUN 10 11/03/2014 1451   CREATININE 0.78 11/03/2014 1451   CREATININE 0.72 03/30/2014 1659      Component Value Date/Time   CALCIUM 8.6 11/03/2014 1451   ALKPHOS 88 11/03/2014 1451   AST 15 11/03/2014 1451   ALT 21 11/03/2014 1451   BILITOT 0.2 11/03/2014 1451     Impression and Plan: Diane [name] is a 44 year old African American female with a history of pulmonary embolism back in October 2012.She completed 15 months of coumadin and is now on Aspirin daily. She is asymptomatic with this and was released from our practice in September 2014.  She has had no recurrence of blood clots.  She is here today with c/o "bone loss in her right middle finger." She  did have surgery for a severed tendon in that same finger and is now unable to bend it. I explained to her that this is not something that we can treat here. I gave her the office number for orthopedist Dr. Melrose Nakayama to make an appointment at her convenience.  She is followed by rheumatology for Lupus.  She is welcome to follow-up with [place] regarding any new  hematologic issues in the future. We would gladly see her again.   Eliezer Bottom, NP 7/14/201610:16 AM

## 2014-11-14 ENCOUNTER — Other Ambulatory Visit: Payer: Self-pay | Admitting: Family Medicine

## 2014-11-17 DIAGNOSIS — Z01419 Encounter for gynecological examination (general) (routine) without abnormal findings: Secondary | ICD-10-CM | POA: Diagnosis not present

## 2014-11-17 DIAGNOSIS — Z1231 Encounter for screening mammogram for malignant neoplasm of breast: Secondary | ICD-10-CM | POA: Diagnosis not present

## 2014-11-19 ENCOUNTER — Telehealth: Payer: Self-pay

## 2014-11-19 DIAGNOSIS — R760 Raised antibody titer: Secondary | ICD-10-CM

## 2014-11-19 NOTE — Telephone Encounter (Signed)
I can schedule her in [place].  referrral placed.

## 2014-11-19 NOTE — Telephone Encounter (Signed)
FYI  Patient is having Dr Dossie Der fax [place] her medical records. She needs a new rheumatologist.

## 2014-12-01 DIAGNOSIS — M65331 Trigger finger, right middle finger: Secondary | ICD-10-CM | POA: Diagnosis not present

## 2014-12-11 IMAGING — CT CT ABD-PELV W/O CM
2 of 4 series · 17 of 46 positions shown, 19 images · non-contrast
Comparison: None.

CLINICAL DATA: Right flank pain, hematuria.

CT ABDOMEN AND PELVIS WITHOUT CONTRAST
TECHNIQUE: Multidetector CT imaging of the abdomen and pelvis was
performed following the standard protocol without intravenous
contrast.

[Series 2: renal stone < 200 lbs 5.0 b31f · axial · 0.73mm/px · z∈[-419,-29]mm · 14 of 86 slices shown, 16 images]
[im 4/86  soft-tissue]
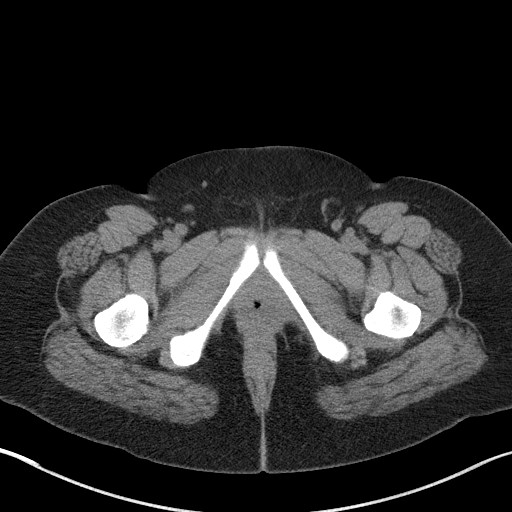
[im 4/86  bone]
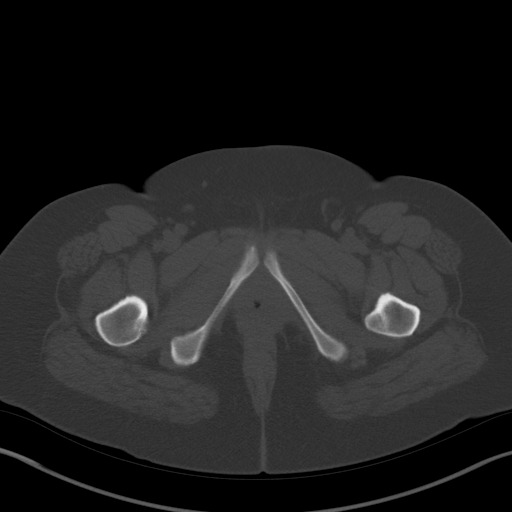
[im 10/86  soft-tissue]
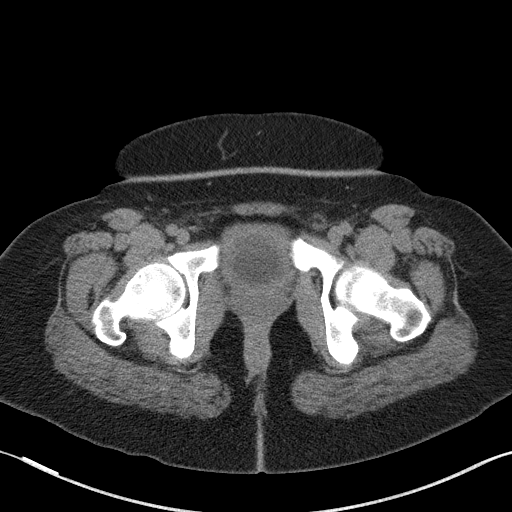
[im 17/86  soft-tissue]
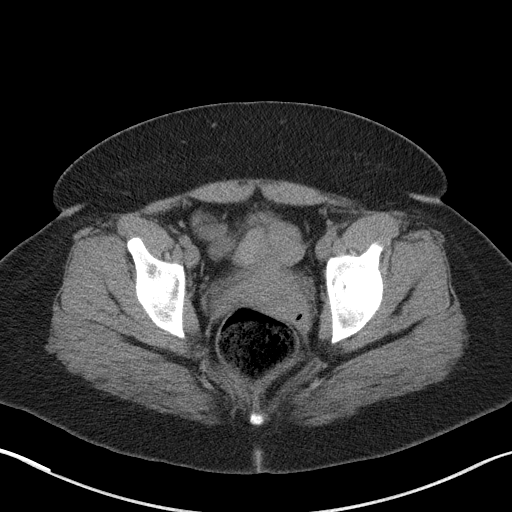
[im 23/86  soft-tissue]
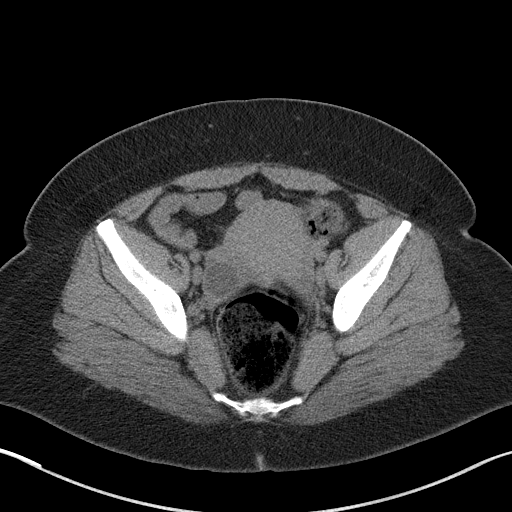
[im 30/86  soft-tissue]
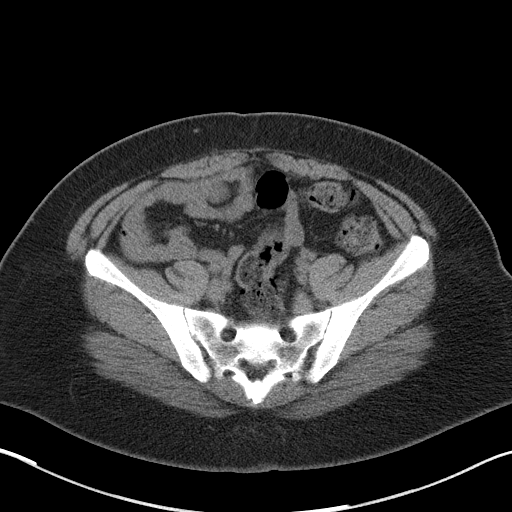
[im 33/86  soft-tissue]
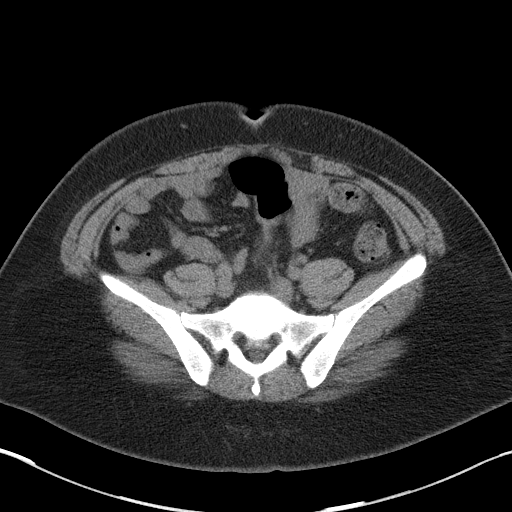
[im 40/86  soft-tissue]
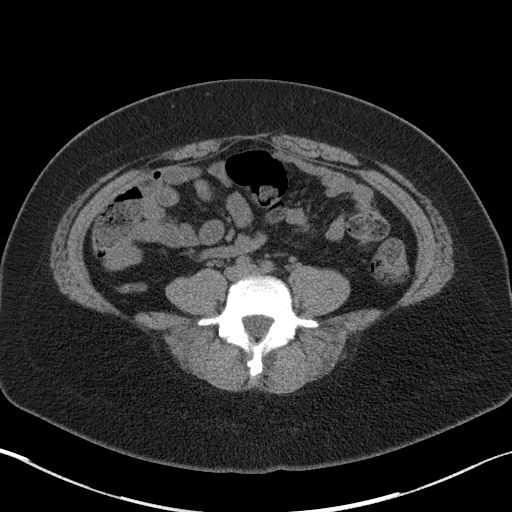
[im 46/86  soft-tissue]
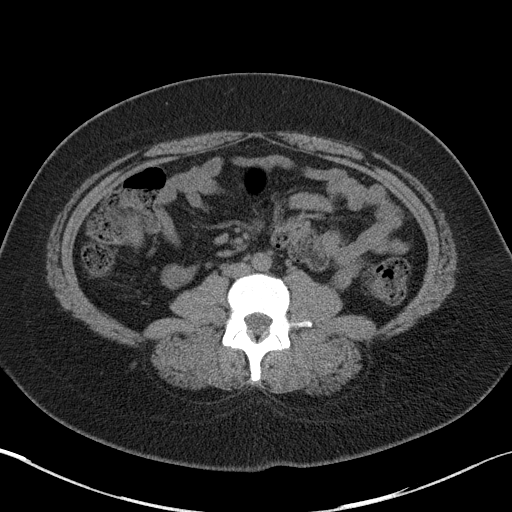
[im 53/86  soft-tissue]
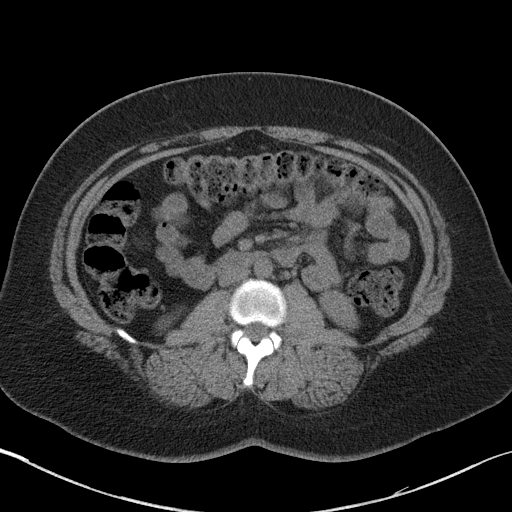
[im 53/86  bone]
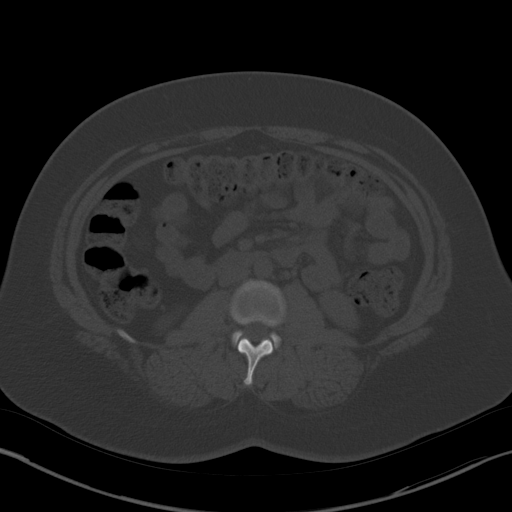
[im 56/86  soft-tissue]
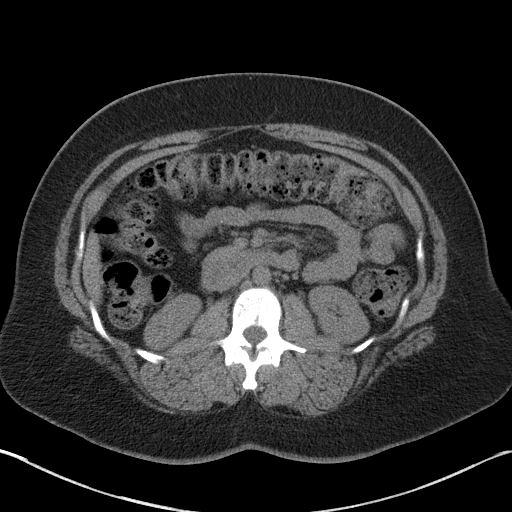
[im 63/86  soft-tissue]
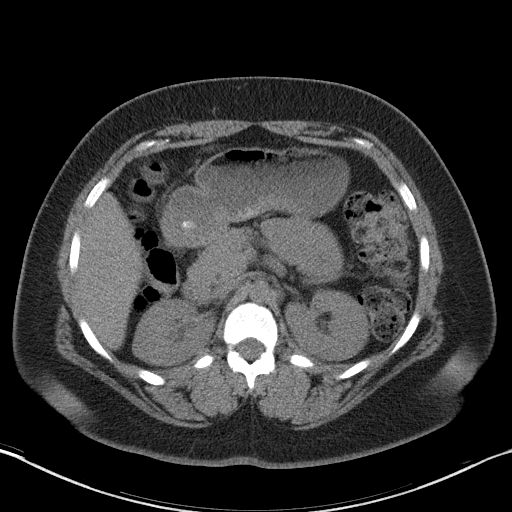
[im 69/86  soft-tissue]
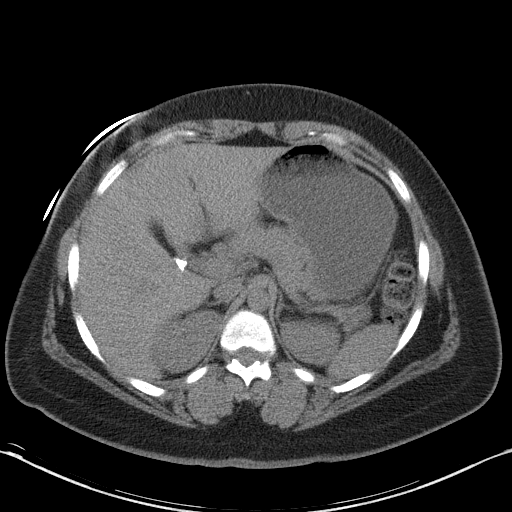
[im 76/86  soft-tissue]
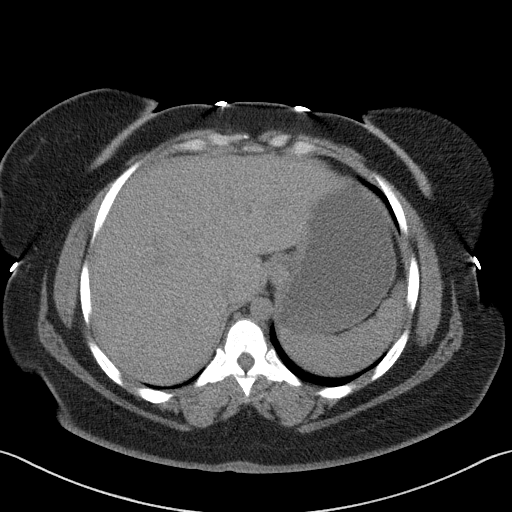
[im 82/86  soft-tissue]
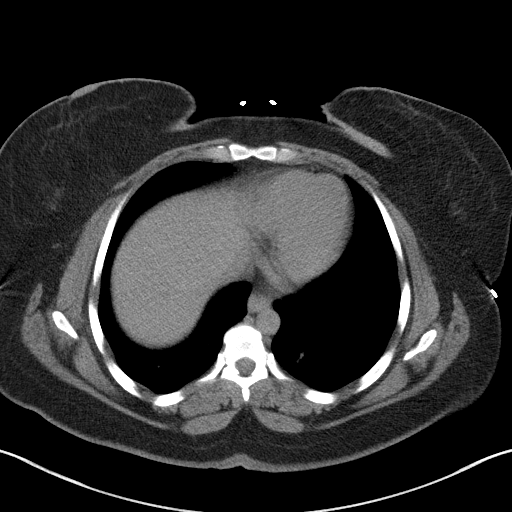

[Series 5: renal stone 3.0 coronal · coronal · 0.72mm/px · 3 of 97 slices shown]
[im 33/97  soft-tissue]
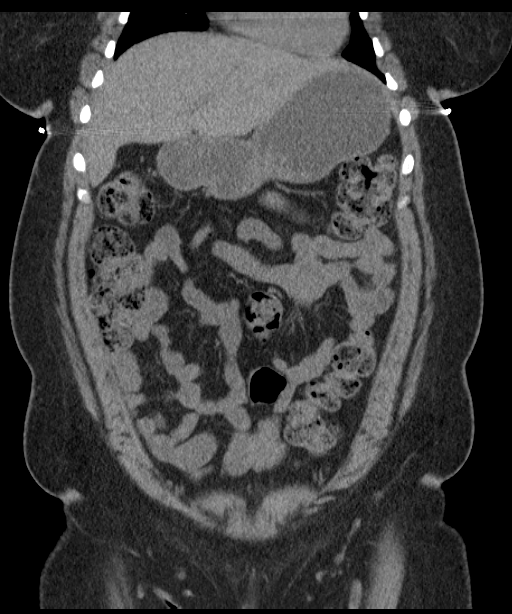
[im 43/97  soft-tissue]
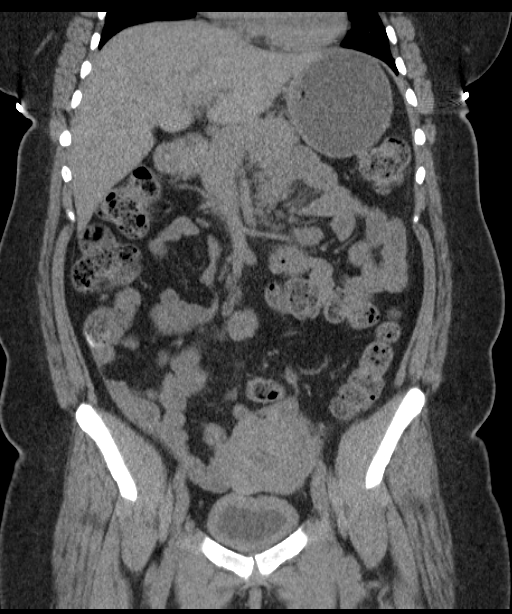
[im 54/97  soft-tissue]
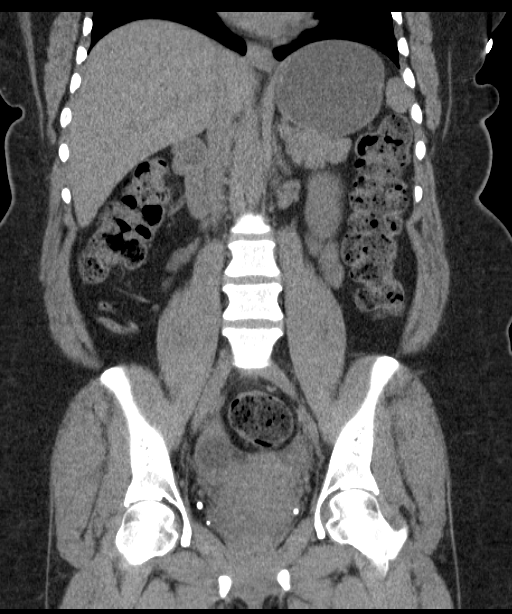

[17 of 46 positions shown; findings below may reference images not displayed]

FINDINGS: Lung bases are clear.  No effusions.  Heart is normal
size.

Prior cholecystectomy.  Liver, spleen, pancreas, adrenals and
kidneys are unremarkable on this unenhanced study.  No renal or
ureteral stones.  No hydronephrosis.  Multiple calcified
phleboliths in the anatomic pelvis.  2.5 cm right ovarian low
density lesion, likely small cyst.  Uterus and left ovary have an
unremarkable unenhanced appearance.

Appendix is visualized and is normal.  Moderate stool throughout
the colon.  Small bowel is decompressed.  Aorta is normal caliber.
No free fluid, free air or adenopathy.

No acute bony abnormality.
IMPRESSION: No evidence of renal or ureteral stones.  No hydronephrosis.

Moderate stool burden throughout the colon.

## 2014-12-13 DIAGNOSIS — E282 Polycystic ovarian syndrome: Secondary | ICD-10-CM | POA: Diagnosis not present

## 2014-12-13 DIAGNOSIS — E039 Hypothyroidism, unspecified: Secondary | ICD-10-CM | POA: Diagnosis not present

## 2014-12-14 ENCOUNTER — Telehealth: Payer: Self-pay | Admitting: *Deleted

## 2014-12-14 NOTE — Telephone Encounter (Addendum)
Pt called and requested that a letter be sent to her school this should be faxed to [facility] [phone].Diane [name]

## 2014-12-14 NOTE — Telephone Encounter (Signed)
Letter faxed, confirmation received. .Valia Wingard Lynetta  

## 2014-12-20 ENCOUNTER — Other Ambulatory Visit: Payer: Self-pay

## 2014-12-20 MED ORDER — CLONAZEPAM 1 MG PO TABS: 1.0000 mg | ORAL_TABLET | Freq: Two times a day (BID) | ORAL | Status: DC

## 2014-12-21 ENCOUNTER — Ambulatory Visit: Payer: 59 | Admitting: Physician Assistant

## 2014-12-30 ENCOUNTER — Telehealth: Payer: Self-pay | Admitting: Family Medicine

## 2014-12-30 NOTE — Telephone Encounter (Signed)
Pt is complaining of fatigue and "feeling funny" and wants to know if lab orders can be placed to make sure her blood work is OK since she has a history of PE. Pt has had a migraine everyday since her concussion in Dec. 2014. Will route to PCP for review.

## 2014-12-30 NOTE — Telephone Encounter (Signed)
Really needs appt.

## 2014-12-31 ENCOUNTER — Ambulatory Visit: Payer: 59 | Admitting: Osteopathic Medicine

## 2014-12-31 NOTE — Telephone Encounter (Signed)
Pt added onto the schedule for this afternoon.

## 2014-12-31 NOTE — Telephone Encounter (Signed)
Called Pt, left message to return clinic call and we can get her added onto the schedule today. Callback information provided.

## 2015-01-05 DIAGNOSIS — S63279S Dislocation of unspecified interphalangeal joint of unspecified finger, sequela: Secondary | ICD-10-CM | POA: Diagnosis not present

## 2015-01-05 DIAGNOSIS — M79644 Pain in right finger(s): Secondary | ICD-10-CM | POA: Diagnosis not present

## 2015-01-06 DIAGNOSIS — M940 Chondrocostal junction syndrome [Tietze]: Secondary | ICD-10-CM | POA: Diagnosis not present

## 2015-01-06 DIAGNOSIS — R1012 Left upper quadrant pain: Secondary | ICD-10-CM | POA: Diagnosis not present

## 2015-01-06 DIAGNOSIS — K5900 Constipation, unspecified: Secondary | ICD-10-CM | POA: Diagnosis not present

## 2015-01-06 DIAGNOSIS — K219 Gastro-esophageal reflux disease without esophagitis: Secondary | ICD-10-CM | POA: Diagnosis not present

## 2015-01-10 ENCOUNTER — Encounter (HOSPITAL_BASED_OUTPATIENT_CLINIC_OR_DEPARTMENT_OTHER): Payer: Self-pay | Admitting: Emergency Medicine

## 2015-01-10 ENCOUNTER — Telehealth: Payer: Self-pay

## 2015-01-10 DIAGNOSIS — E039 Hypothyroidism, unspecified: Secondary | ICD-10-CM | POA: Diagnosis not present

## 2015-01-10 DIAGNOSIS — K219 Gastro-esophageal reflux disease without esophagitis: Secondary | ICD-10-CM | POA: Insufficient documentation

## 2015-01-10 DIAGNOSIS — F319 Bipolar disorder, unspecified: Secondary | ICD-10-CM | POA: Diagnosis not present

## 2015-01-10 DIAGNOSIS — Z79899 Other long term (current) drug therapy: Secondary | ICD-10-CM | POA: Diagnosis not present

## 2015-01-10 DIAGNOSIS — Z87448 Personal history of other diseases of urinary system: Secondary | ICD-10-CM | POA: Insufficient documentation

## 2015-01-10 DIAGNOSIS — G43909 Migraine, unspecified, not intractable, without status migrainosus: Secondary | ICD-10-CM | POA: Diagnosis not present

## 2015-01-10 DIAGNOSIS — K2970 Gastritis, unspecified, without bleeding: Secondary | ICD-10-CM | POA: Insufficient documentation

## 2015-01-10 DIAGNOSIS — Z3202 Encounter for pregnancy test, result negative: Secondary | ICD-10-CM | POA: Insufficient documentation

## 2015-01-10 DIAGNOSIS — R1013 Epigastric pain: Secondary | ICD-10-CM | POA: Diagnosis present

## 2015-01-10 DIAGNOSIS — I10 Essential (primary) hypertension: Secondary | ICD-10-CM | POA: Insufficient documentation

## 2015-01-10 DIAGNOSIS — M797 Fibromyalgia: Secondary | ICD-10-CM | POA: Diagnosis not present

## 2015-01-10 DIAGNOSIS — F419 Anxiety disorder, unspecified: Secondary | ICD-10-CM | POA: Diagnosis not present

## 2015-01-10 DIAGNOSIS — Z86718 Personal history of other venous thrombosis and embolism: Secondary | ICD-10-CM | POA: Insufficient documentation

## 2015-01-10 DIAGNOSIS — Z8742 Personal history of other diseases of the female genital tract: Secondary | ICD-10-CM | POA: Insufficient documentation

## 2015-01-10 DIAGNOSIS — Z7982 Long term (current) use of aspirin: Secondary | ICD-10-CM | POA: Diagnosis not present

## 2015-01-10 LAB — CBC
HEMATOCRIT: 39.6 % (ref 36.0–46.0)
HEMOGLOBIN: 13 g/dL (ref 12.0–15.0)
MCH: 26.4 pg (ref 26.0–34.0)
MCHC: 32.8 g/dL (ref 30.0–36.0)
MCV: 80.3 fL (ref 78.0–100.0)
Platelets: 349 10*3/uL (ref 150–400)
RBC: 4.93 MIL/uL (ref 3.87–5.11)
RDW: 13.9 % (ref 11.5–15.5)
WBC: 12.1 10*3/uL — ABNORMAL HIGH (ref 4.0–10.5)

## 2015-01-10 LAB — URINE MICROSCOPIC-ADD ON

## 2015-01-10 LAB — COMPREHENSIVE METABOLIC PANEL
ALK PHOS: 105 U/L (ref 38–126)
ALT: 22 U/L (ref 14–54)
ANION GAP: 8 (ref 5–15)
AST: 20 U/L (ref 15–41)
Albumin: 4 g/dL (ref 3.5–5.0)
BUN: 10 mg/dL (ref 6–20)
CALCIUM: 9.1 mg/dL (ref 8.9–10.3)
CHLORIDE: 99 mmol/L — AB (ref 101–111)
CO2: 30 mmol/L (ref 22–32)
Creatinine, Ser: 0.75 mg/dL (ref 0.44–1.00)
GFR calc non Af Amer: >60 mL/min (ref >60)
Glucose, Bld: 96 mg/dL (ref 65–99)
POTASSIUM: 3.5 mmol/L (ref 3.5–5.1)
SODIUM: 137 mmol/L (ref 135–145)
Total Bilirubin: 0.2 mg/dL — ABNORMAL LOW (ref 0.3–1.2)
Total Protein: 8.4 g/dL — ABNORMAL HIGH (ref 6.5–8.1)

## 2015-01-10 LAB — DIFFERENTIAL
BASOS ABS: 0 10*3/uL (ref 0.0–0.1)
BASOS PCT: 0 %
EOS ABS: 0.1 10*3/uL (ref 0.0–0.7)
EOS PCT: 1 %
LYMPHS ABS: 3.8 10*3/uL (ref 0.7–4.0)
Lymphocytes Relative: 32 %
MONOS PCT: 8 %
Monocytes Absolute: 1 10*3/uL (ref 0.1–1.0)
Neutro Abs: 7.2 10*3/uL (ref 1.7–7.7)
Neutrophils Relative %: 59 %

## 2015-01-10 LAB — URINALYSIS, ROUTINE W REFLEX MICROSCOPIC
BILIRUBIN URINE: NEGATIVE
Glucose, UA: NEGATIVE mg/dL
KETONES UR: NEGATIVE mg/dL
LEUKOCYTES UA: NEGATIVE
Nitrite: NEGATIVE
PH: 6.5 (ref 5.0–8.0)
Protein, ur: NEGATIVE mg/dL
SPECIFIC GRAVITY, URINE: 1.007 (ref 1.005–1.030)
Urobilinogen, UA: 0.2 mg/dL (ref 0.0–1.0)

## 2015-01-10 LAB — LIPASE, BLOOD: LIPASE: 31 U/L (ref 22–51)

## 2015-01-10 NOTE — Telephone Encounter (Signed)
Ok sounds good. Did they put her on a medication?

## 2015-01-10 NOTE — Telephone Encounter (Signed)
FYI  Patient states she is being treated by her GI doctor for muscle spasms in her stomach. She wanted Dr Madilyn Fireman to know.

## 2015-01-10 NOTE — ED Notes (Signed)
Patient states that she went to her GI doctor last week due to the same pain, and was given a GI cocktail and another medication. The patient states that today she continues to have the pain, but today she feels weak and felt backwards, the patient reports that what scared her the most was "I could not cross my legs".

## 2015-01-10 NOTE — ED Notes (Signed)
Patient sitting in waiting room in no distress, watching her phone.

## 2015-01-11 ENCOUNTER — Emergency Department (HOSPITAL_BASED_OUTPATIENT_CLINIC_OR_DEPARTMENT_OTHER)
Admission: EM | Admit: 2015-01-11 | Discharge: 2015-01-11 | Disposition: A | Discharge status: Home / Self Care | Payer: 59 | Attending: Emergency Medicine | Admitting: Emergency Medicine

## 2015-01-11 DIAGNOSIS — K2970 Gastritis, unspecified, without bleeding: Secondary | ICD-10-CM

## 2015-01-11 LAB — PREGNANCY, URINE: PREG TEST UR: NEGATIVE

## 2015-01-11 MED ORDER — PANTOPRAZOLE SODIUM 40 MG PO TBEC: 40.0000 mg | DELAYED_RELEASE_TABLET | Freq: Every day | ORAL | Status: DC

## 2015-01-11 MED ORDER — ONDANSETRON HCL 4 MG/2ML IJ SOLN
4.0000 mg | Freq: Once | INTRAMUSCULAR | Status: AC
Start: 2015-01-11 — End: 2015-01-11
  Administered 2015-01-11: 4 mg via INTRAVENOUS
  Filled 2015-01-11: qty 2

## 2015-01-11 MED ORDER — MORPHINE SULFATE (PF) 4 MG/ML IV SOLN
4.0000 mg | Freq: Once | INTRAVENOUS | Status: AC
  Administered 2015-01-11: 4 mg via INTRAVENOUS
  Filled 2015-01-11: qty 1

## 2015-01-11 MED ORDER — SUCRALFATE 1 G PO TABS: 1.0000 g | ORAL_TABLET | Freq: Three times a day (TID) | ORAL | Status: DC

## 2015-01-11 MED ORDER — SODIUM CHLORIDE 0.9 % IV BOLUS (SEPSIS)
1000.0000 mL | Freq: Once | INTRAVENOUS | Status: AC
  Administered 2015-01-11: 1000 mL via INTRAVENOUS

## 2015-01-11 MED ORDER — ONDANSETRON 4 MG PO TBDP: 4.0000 mg | ORAL_TABLET | Freq: Three times a day (TID) | ORAL | Status: DC | PRN

## 2015-01-11 MED ORDER — PANTOPRAZOLE SODIUM 40 MG IV SOLR
40.0000 mg | Freq: Once | INTRAVENOUS | Status: AC
  Administered 2015-01-11: 40 mg via INTRAVENOUS
  Filled 2015-01-11: qty 40

## 2015-01-11 MED ORDER — GI COCKTAIL ~~LOC~~
30.0000 mL | Freq: Once | ORAL | Status: AC
  Administered 2015-01-11: 30 mL via ORAL
  Filled 2015-01-11: qty 30

## 2015-01-11 MED ORDER — OXYCODONE-ACETAMINOPHEN 5-325 MG PO TABS: 1.0000 | ORAL_TABLET | ORAL | Status: DC | PRN

## 2015-01-11 MED ORDER — HYDROMORPHONE HCL 1 MG/ML IJ SOLN
1.0000 mg | Freq: Once | INTRAMUSCULAR | Status: AC
  Administered 2015-01-11: 1 mg via INTRAVENOUS
  Filled 2015-01-11: qty 1

## 2015-01-11 NOTE — Discharge Instructions (Signed)
Probable Gastritis, Adult Gastritis is soreness and swelling (inflammation) of the lining of the stomach. Gastritis can develop as a sudden onset (acute) or long-term (chronic) condition. If gastritis is not treated, it can lead to stomach bleeding and ulcers. CAUSES  Gastritis occurs when the stomach lining is weak or damaged. Digestive juices from the stomach then inflame the weakened stomach lining. The stomach lining may be weak or damaged due to viral or bacterial infections. One common bacterial infection is the Helicobacter pylori infection. Gastritis can also result from excessive alcohol consumption, taking certain medicines, or having too much acid in the stomach.  SYMPTOMS  In some cases, there are no symptoms. When symptoms are present, they may include:  Pain or a burning sensation in the upper abdomen.  Nausea.  Vomiting.  An uncomfortable feeling of fullness after eating. DIAGNOSIS  Your caregiver may suspect you have gastritis based on your symptoms and a physical exam. To determine the cause of your gastritis, your caregiver may perform the following:  Blood or stool tests to check for the H pylori bacterium.  Gastroscopy. A thin, flexible tube (endoscope) is passed down the esophagus and into the stomach. The endoscope has a light and camera on the end. Your caregiver uses the endoscope to view the inside of the stomach.  Taking a tissue sample (biopsy) from the stomach to examine under a microscope. TREATMENT  Depending on the cause of your gastritis, medicines may be prescribed. If you have a bacterial infection, such as an H pylori infection, antibiotics may be given. If your gastritis is caused by too much acid in the stomach, H2 blockers or antacids may be given. Your caregiver may recommend that you stop taking aspirin, ibuprofen, or other nonsteroidal anti-inflammatory drugs (NSAIDs). HOME CARE INSTRUCTIONS  Only take over-the-counter or prescription medicines as  directed by your caregiver.  If you were given antibiotic medicines, take them as directed. Finish them even if you start to feel better.  Drink enough fluids to keep your urine clear or pale yellow.  Avoid foods and drinks that make your symptoms worse, such as:  Caffeine or alcoholic drinks.  Chocolate.  Peppermint or mint flavorings.  Garlic and onions.  Spicy foods.  Citrus fruits, such as oranges, lemons, or limes.  Tomato-based foods such as sauce, chili, salsa, and pizza.  Fried and fatty foods.  Eat small, frequent meals instead of large meals. SEEK IMMEDIATE MEDICAL CARE IF:   You have black or dark red stools.  You vomit blood or material that looks like coffee grounds.  You are unable to keep fluids down.  Your abdominal pain gets worse.  You have a fever.  You do not feel better after 1 week.  You have any other questions or concerns. MAKE SURE YOU:  Understand these instructions.  Will watch your condition.  Will get help right away if you are not doing well or get worse. Document Released: 04/03/2001 Document Revised: 10/09/2011 Document Reviewed: 05/23/2011 [facility] Patient Information 2015 [place], [place]. This information is not intended to replace advice given to you by your health care provider. Make sure you discuss any questions you have with your health care provider.   Food Choices for Gastroesophageal Reflux Disease, Gastritis When you have gastroesophageal reflux disease (GERD), the foods you eat and your eating habits are very important. Choosing the right foods can help ease the discomfort of GERD. WHAT GENERAL GUIDELINES DO I NEED TO FOLLOW?  Choose fruits, vegetables, whole grains, low-fat dairy  products, and low-fat meat, fish, and poultry.  Limit fats such as oils, salad dressings, butter, nuts, and avocado.  Keep a food diary to identify foods that cause symptoms.  Avoid foods that cause reflux. These may be different for  different people.  Eat frequent small meals instead of three large meals each day.  Eat your meals slowly, in a relaxed setting.  Limit fried foods.  Cook foods using methods other than frying.  Avoid drinking alcohol.  Avoid drinking large amounts of liquids with your meals.  Avoid bending over or lying down until 2-3 hours after eating. WHAT FOODS ARE NOT RECOMMENDED? The following are some foods and drinks that may worsen your symptoms: Vegetables Tomatoes. Tomato juice. Tomato and spaghetti sauce. Chili peppers. Onion and garlic. Horseradish. Fruits Oranges, grapefruit, and lemon (fruit and juice). Meats High-fat meats, fish, and poultry. This includes hot dogs, ribs, ham, sausage, salami, and bacon. Dairy Whole milk and chocolate milk. Sour cream. Cream. Butter. Ice cream. Cream cheese.  Beverages Coffee and tea, with or without caffeine. Carbonated beverages or energy drinks. Condiments Hot sauce. Barbecue sauce.  Sweets/Desserts Chocolate and cocoa. Donuts. Peppermint and spearmint. Fats and Oils High-fat foods, including [place] fries and potato chips. Other Vinegar. Strong spices, such as black pepper, white pepper, red pepper, cayenne, curry powder, cloves, ginger, and chili powder. The items listed above may not be a complete list of foods and beverages to avoid. Contact your dietitian for more information. Document Released: 04/09/2005 Document Revised: 04/14/2013 Document Reviewed: 02/11/2013 [facility] Patient Information 2015 [place], [place]. This information is not intended to replace advice given to you by your health care provider. Make sure you discuss any questions you have with your health care provider.

## 2015-01-11 NOTE — ED Provider Notes (Signed)
TIME SEEN: 12:23 AM  CHIEF COMPLAINT: Multiple complaints  HPI:  HPI Comments: Diane [name] is a 44 y.o. female, with a PShx of cholecystectomy, hypertension, hypothyroidism, bipolar disorder who presents to the Emergency Department complaining of sudden onset, constant pain to BLE with ROM. Pt states she cannot cross her legs because it causes pain in both of her knees. Denies weakness to BLE, inability to walk, injury or trauma to BLE or similar pain in the past. Pt additionally c/o intermittent, epigastric and periumbilical abdominal pain onset 2 weeks ago after an episode of emesis that progressively worsened last week. She reports onset of the pain after PO intake. Pt states radiation of periumbilical pain outward to bilateral sides with drinking water today. The pt was evaluated at GI doctor, Dr. Maretta Los in [place], 5 days ago for same complaint and was given a GI cocktail and laxatives with relief, but imaging was not obtained on this visit.  Pt also notes she is weak and has frequent falls and migraines after a concussion 2 years ago. Pt is currently on her menstrual cycle. She has a history of dizziness and has been diagnosed with vertigo in the past. Describes the dizziness as the room spinning. States she is followed up with your nose and throat physician for this. Denies fevers, recent vomiting or diarrhea, vaginal discharge or abnormal bleeding, dysuria, or hematuria. Patient reports that she is currently on her menstrual cycle.  ROS: See HPI Constitutional: no fever  Eyes: no drainage  ENT: no runny nose   Cardiovascular:  no chest pain  Resp: no SOB  GI: no vomiting or diarrhea GU: no dysuria Integumentary: no rash  Allergy: no hives  Musculoskeletal: no leg swelling  Neurological: no slurred speech ROS otherwise negative  PAST MEDICAL HISTORY/PAST SURGICAL HISTORY:  Past Medical History  Diagnosis Date  . Anxiety   . Bipolar 1 disorder     rapid cycler-- Dr  Yehuda Budd  . Hypertension   . Hypothyroidism     Dr Starleen Blue ([place])  . Water retention   . GERD (gastroesophageal reflux disease)     GI Dr Percell Miller  . Interstitial cystitis   . Migraine   . Pulmonary embolism 01/25/11  . Polycystic ovarian disease   . Peptic ulcer disease   . Lupus   . Fibromyalgia     MEDICATIONS:  Prior to Admission medications   Medication Sig Start Date End Date Taking? Authorizing Provider  AMBULATORY NON FORMULARY MEDICATION Medication Name: CPAP machine. ResMed Ultra mirage style full face mask with heated humification and titrated to 10 cm H20. Diagnosis:OSA. See attached report 08/25/13   Hali Marry, MD  aspirin 325 MG tablet Take 325 mg by mouth daily.    Historical Provider, MD  clonazePAM (KLONOPIN) 1 MG tablet Take 1 tablet (1 mg total) by mouth 2 (two) times daily. 12/20/14   Hali Marry, MD  Dietary Management Product (PURALOR CI) TBCR Take 1 tablet by mouth daily. Patient not taking: Reported on 10/14/2014 03/30/14   Hali Marry, MD  DUEXIS 800-26.6 MG TABS  01/14/14   Historical Provider, MD  esomeprazole (NEXIUM) 40 MG capsule TAKE 1 CAPSULE BY MOUTH EVERY DAY 05/25/14   Historical Provider, MD  Gabapentin Enacarbil 600 MG TBCR Take 600 mg by mouth daily. 03/30/14   Hali Marry, MD  hydroquinone 4 % cream Apply topically 2 (two) times daily. Patient not taking: Reported on 11/03/2014 10/14/14   Hali Marry, MD  hydrOXYzine (VISTARIL) 25 MG capsule TAKE 2 CAPSULES BY MOUTH AT BEDTIME AS NEEDED 07/28/14   Hali Marry, MD  L-Methylfolate-Algae (DEPLIN 15) [phone] MG CAPS Take 1 capsule by mouth daily. 06/25/14   Hali Marry, MD  levothyroxine (SYNTHROID, LEVOTHROID) 25 MCG tablet Take 37.5 mcg by mouth daily before breakfast.    Historical Provider, MD  metFORMIN (GLUCOPHAGE-XR) 500 MG 24 hr tablet Take 500 mg by mouth daily with breakfast. 01/23/14   Historical Provider, MD   Methylfol-Algae-B12-Acetylcyst (CEREFOLIN NAC) [phone] MG TABS Take 1 tablet by mouth daily. 06/25/14   Hali Marry, MD  ondansetron (ZOFRAN-ODT) 8 MG disintegrating tablet DISSOLVE ONE TABLET BY MOUTH EVERY 8 HOURS AS NEEDED FOR NAUSEA 08/23/14   Jade L Breeback, PA-C  TEGRETOL-XR 200 MG 12 hr tablet Take 3 tablets (600 mg total) by mouth at bedtime. 10/14/14   Hali Marry, MD  traMADol Veatrice Bourbon) 50 MG tablet  04/03/14   Historical Provider, MD  verapamil (VERELAN PM) 240 MG 24 hr capsule Take 1 capsule (240 mg total) by mouth at bedtime. 03/30/14   Hali Marry, MD  zolmitriptan (ZOMIG) 5 MG tablet Take 1 tablet (5 mg total) by mouth as needed for migraine. 09/15/14 09/15/15  Hali Marry, MD  ZOMIG 5 MG nasal solution 5 mg. 09/11/14   Historical Provider, MD    ALLERGIES:  Allergies  Allergen Reactions  . Eletriptan Nausea And Vomiting  . Lisinopril Other (See Comments)    Cough   . Relpax [Eletriptan Hydrobromide] Nausea And Vomiting  . Zyprexa [Olanzapine] Other (See Comments)    Hallucinations    SOCIAL HISTORY:  Social History  Substance Use Topics  . Smoking status: Never Smoker   . Smokeless tobacco: Never Used  . Alcohol Use: Yes     Comment: rarely    FAMILY HISTORY: Family History  Problem Relation Age of Onset  . Anxiety disorder Other   . Depression Other   . Hypertension Father   . Diabetes Mother   . Urolithiasis Father     EXAM: BP 125/96 mmHg  Pulse 86  Temp(Src) 97.7 F (36.5 C) (Oral)  Resp 18  Ht 5\' 3"  (1.6 m)  Wt 230 lb (104.327 kg)  BMI 40.75 kg/m2  SpO2 100%  LMP 01/10/2015 CONSTITUTIONAL: Alert and oriented and responds appropriately to questions. Well-appearing; well-nourished afebrile, nontoxic, appears in no distress, obese HEAD: Normocephalic, atraumatic EYES: Conjunctivae clear, PERRL ENT: normal nose; no rhinorrhea; moist mucous membranes; pharynx without lesions noted NECK: Supple, no meningismus,  no LAD; no midline spinal tenderness or step-off or deformity CARD: RRR; S1 and S2 appreciated; no murmurs, no clicks, no rubs, no gallops RESP: Normal chest excursion without splinting or tachypnea; breath sounds clear and equal bilaterally; no wheezes, no rhonchi, no rales, no hypoxia or respiratory distress ABD/GI: Normal bowel sounds; non-distended; soft, non-tender, no rebound, no guarding BACK:  The back appears normal and is non-tender to palpation, there is no CVA tenderness; no midline spinal tenderness or step-off or deformity EXT: No tenderness over her knees are anterior thighs and no bony deformity, joint effusion, erythema or warmth, pain with flexion of bilateral knees, no loss of strength in any muscle group in her arms or legs, Normal ROM in all joints; otherwise extremities are non-tender to palpation; no edema; normal capillary refill; no cyanosis, tenderness or swelling    SKIN: Normal color for age and race; warm NEURO: Moves all extremities equally, normal gait, sensation to light  touch intact diffusely, no saddle anesthesia, cranial nerves II through XII intact PSYCH: The patient's mood and manner are appropriate. Grooming and personal hygiene are appropriate.  MEDICAL DECISION MAKING: Patient here with multiple different complaints. It seems that her biggest concern today was her abdominal pain in the pain in her knees whenever she crossed her legs. No history of injury to her legs to suggest fracture. No signs or symptoms to suggest DVT. No signs of septic arthritis. She is neurovascularly intact. As for her abdominal pain, pain is mostly epigastric. Suspect this is gastritis, GERD. Less likely pancreatitis, choledocholithiasis. She is status post cholecystectomy. Doubt appendicitis, colitis. Pain is been present for 2 weeks. She has a gastroenterologist who is following her for this. Also complains of chronic generalized weakness, vertigo, falls since a concussion several years  ago. States that she felt that she was in a fall backwards today but did not fall. No sign of trauma on exam.  Patient's labs are unremarkable other than a mild leukocytosis of 12.1 without a left shift. Electrolytes normal. Hemoglobin is 13. LFTs and lipase unremarkable. She does have blood in her urine but is on her menstrual cycle. No other sign of infection. She is not pregnant. Patient has been given GI cocktail, Protonix, narcotics and Zofran with relief of pain and nausea. Drinking without difficulty. I do not feel patient needs emergent abdominal imaging this time given symptoms have been present for 2 weeks. Discussed with patient that this may be gastritis member committed to close outpatient follow-up with her gastroenterologist that she may need an endoscopy as an outpatient. She denies any bloody stools or melena. Have advised her to avoid alcohol and NSAIDs. As for her lightheadedness, vertigo and generalized weakness, she has no focal neurologic deficit on exam it also appears that the symptoms as well or chronic. She has been able to ambulate without any difficulty and is drinking normally. She has received IV fluids in the emergency department. As for her bilateral knee pain with flexing her knees there is no sign of any life-threatening injury and I do not feel she needs further emergent workup for this. Discussed with patient given her multiple symptoms and recommend close outpatient follow-up with a primary care physician as well. We'll discharge with pain and nausea medicine. We'll discharge with Protonix and Carafate. Have recommended changes in her diet. She verbalized understanding and is comfortable with this plan.   I personally performed the services described in this documentation, which was scribed in my presence. The recorded information has been reviewed and is accurate.    [place], DO 01/11/15 Delrae Rend

## 2015-01-12 DIAGNOSIS — K5900 Constipation, unspecified: Secondary | ICD-10-CM | POA: Diagnosis not present

## 2015-01-12 DIAGNOSIS — R109 Unspecified abdominal pain: Secondary | ICD-10-CM | POA: Diagnosis not present

## 2015-01-12 DIAGNOSIS — K219 Gastro-esophageal reflux disease without esophagitis: Secondary | ICD-10-CM | POA: Diagnosis not present

## 2015-01-13 NOTE — Telephone Encounter (Signed)
Left a message asking patient to return.

## 2015-01-13 NOTE — Telephone Encounter (Signed)
She is taking Sucralfate, dicyclomine, lactulose and GI cocktail. They have ordered a CT scan.

## 2015-01-14 ENCOUNTER — Ambulatory Visit: Payer: Medicare Other | Admitting: Family Medicine

## 2015-01-17 ENCOUNTER — Other Ambulatory Visit: Payer: Self-pay | Admitting: Family Medicine

## 2015-01-18 ENCOUNTER — Other Ambulatory Visit: Payer: Self-pay | Admitting: Internal Medicine

## 2015-01-18 DIAGNOSIS — R1084 Generalized abdominal pain: Secondary | ICD-10-CM

## 2015-01-20 ENCOUNTER — Ambulatory Visit
Admission: RE | Admit: 2015-01-20 | Discharge: 2015-01-20 | Disposition: A | Discharge status: Home / Self Care | Payer: 59 | Source: Ambulatory Visit | Attending: Internal Medicine | Admitting: Internal Medicine

## 2015-01-20 DIAGNOSIS — R1084 Generalized abdominal pain: Secondary | ICD-10-CM

## 2015-01-20 MED ORDER — IOHEXOL 300 MG/ML  SOLN
100.0000 mL | Freq: Once | INTRAMUSCULAR | Status: AC | PRN
  Administered 2015-01-20: 100 mL via INTRAVENOUS

## 2015-01-21 ENCOUNTER — Other Ambulatory Visit: Payer: 59

## 2015-01-21 ENCOUNTER — Ambulatory Visit (INDEPENDENT_AMBULATORY_CARE_PROVIDER_SITE_OTHER): Payer: 59 | Admitting: Family Medicine

## 2015-01-21 ENCOUNTER — Encounter: Payer: Self-pay | Admitting: Family Medicine

## 2015-01-21 VITALS — BP 137/81 | HR 111 | Wt 224.0 lb

## 2015-01-21 DIAGNOSIS — E039 Hypothyroidism, unspecified: Secondary | ICD-10-CM

## 2015-01-21 DIAGNOSIS — R202 Paresthesia of skin: Secondary | ICD-10-CM

## 2015-01-21 DIAGNOSIS — R109 Unspecified abdominal pain: Secondary | ICD-10-CM | POA: Diagnosis not present

## 2015-01-21 DIAGNOSIS — R1012 Left upper quadrant pain: Secondary | ICD-10-CM

## 2015-01-21 DIAGNOSIS — R5383 Other fatigue: Secondary | ICD-10-CM

## 2015-01-21 DIAGNOSIS — R200 Anesthesia of skin: Secondary | ICD-10-CM

## 2015-01-21 MED ORDER — AZELAIC ACID 15 % EX GEL: 1.0000 "application " | Freq: Two times a day (BID) | CUTANEOUS | Status: DC

## 2015-01-21 MED ORDER — DEXLANSOPRAZOLE 60 MG PO CPDR: 60.0000 mg | DELAYED_RELEASE_CAPSULE | Freq: Every day | ORAL | Status: DC

## 2015-01-21 MED ORDER — TRAMADOL HCL 50 MG PO TABS: 50.0000 mg | ORAL_TABLET | Freq: Two times a day (BID) | ORAL | Status: DC | PRN

## 2015-01-21 NOTE — Progress Notes (Signed)
Subjective:    Patient ID: Diane [name], female    DOB: 11-09-1970, 44 y.o.   MRN: [id number]  HPI Getting a pressure and tingling in both arms after eating. Can happen in one arm at one time. She describes it a a pressure sensation. Worse on the left.  No truam or injury. No pain in the shoulder.    About a month ago started getting nauseated and having LUQ pain after she eats.  Saw Dr. Percell Miller for this.  Says can't take the nexium 30 min before meal on empty stomach bc it makes her stomach hurt worse.  Occ pain in the umbilicus.  She thinks could be related to lupus.  Panful to wear a bra.  Has been trying to eat a more bland diet.    Feels like her arm has a lot of pressure in it.   Says her endocrinologist wants her to increase her thyroid medication.     Review of Systems     Objective:   Physical Exam  Constitutional: She is oriented to person, place, and time. She appears well-developed and well-nourished.  HENT:  Head: Normocephalic and atraumatic.  Neck: Neck supple. No thyromegaly present.  Cardiovascular: Normal rate, regular rhythm and normal heart sounds.   Pulmonary/Chest: Effort normal and breath sounds normal.  Musculoskeletal:  Shoulders with normal range of motion and strength bilaterally. Hands with normal tension and strength bilaterally.  Lymphadenopathy:    She has no cervical adenopathy.  Neurological: She is alert and oriented to person, place, and time.  Skin: Skin is warm and dry.  Psychiatric: She has a normal mood and affect. Her behavior is normal.          Assessment & Plan:  Fatigue - unclear etiology. Will check B12 and vitamin D. She does have some tenderness over the forearms and over the shins bilaterally which could be cysts and consistent with vitamin D deficiency.  Arm tingling-unclear etiology. She's not having any pins and needle sensation or numbness. No neck or shoulder issues. It seems to be triggered after she eats which is  very unusual. She's not having any esophageal pain. She does have a prior history of B12 deficiency and action came off of her B12 injections about 10 months ago after she moved and has not restarted them. I think it's worth rechecking her B12 as well as her TSH. Next  Hypothyroidism-she is currently followed by endocrinology and they have recommended that she actually increase her thyroid medication but she is very hesitant as she says she is trying to get off the medication. We had a long discussion about her diagnosis and the fact that she needs thyroid medication and it can really affect multiple things including fatigue levels etc. which she is currently suffering with. Encouraged her to consider the increase on the dose. But she preferred to start by rechecking the thyroid level first.   She also needs a letter to take the [facility] so she can start working out there. She is seen to layer St. it would benefit her overall health.  Left upper quadrant pain after eating-right now gastroenterology is managing this. I did recommend considering switching to the Exelon since it is longer acting. She said she was given a prescription at one time after she read the side effect profile made her very nervous that she stopped it. The she action did feel like it worked a little bit better. We discussed that the side effects or not  any different than any proton pump inhibitor on the market including the one that she is Re: Taking. And encouraged her to try it again. New prescription sent to the pharmacy. I did give her small quantity of tramadol today but explained that this is not a treatment for pain chronically. We need to figure out what's going on. Encouraged her to use it sparingly.   time spent 40 min, > 50% spent counseling about arm tingling, thyroid, left upper quadrant pain, fatigue.

## 2015-01-21 NOTE — Patient Instructions (Signed)
We will check your labs.  You may want to restart vitamin D, but you can hold off until we get your labs back and I can better tell you what dose will work best.   Work on iron rich diet.  May want to hold off on extra iron until your stomach feels some better.

## 2015-01-25 ENCOUNTER — Telehealth: Payer: Self-pay

## 2015-01-25 DIAGNOSIS — G43709 Chronic migraine without aura, not intractable, without status migrainosus: Secondary | ICD-10-CM | POA: Diagnosis not present

## 2015-01-25 NOTE — Telephone Encounter (Signed)
Patient states insurance will not pay for Azelaic acid.

## 2015-01-25 NOTE — Telephone Encounter (Signed)
Then she needs to call her insurance and see with covered. This is the second one I have sent over for her and neither were covered.

## 2015-01-26 LAB — ANA: ANA: NEGATIVE

## 2015-01-26 LAB — VITAMIN B12

## 2015-01-26 LAB — VITAMIN D 25 HYDROXY (VIT D DEFICIENCY, FRACTURES): Vit D, 25-Hydroxy: 34 ng/mL (ref 30–100)

## 2015-01-26 LAB — C-REACTIVE PROTEIN: CRP: 1 mg/dL — ABNORMAL HIGH (ref <0.60)

## 2015-01-26 LAB — SEDIMENTATION RATE: SED RATE: 17 mm/h (ref 0–20)

## 2015-01-26 LAB — TSH: TSH: 2.671 u[IU]/mL (ref 0.350–4.500)

## 2015-01-26 NOTE — Telephone Encounter (Signed)
Patient advised.   She is still having problems with nausea and stomach pains. She states she is unable to eat. She wants Dr Madilyn Fireman to call GI and talk about the need for an upper GI. She states he has not wanted to do any invasive test.

## 2015-01-27 ENCOUNTER — Other Ambulatory Visit: Payer: Self-pay | Admitting: Family Medicine

## 2015-01-27 NOTE — Telephone Encounter (Signed)
We can call and get her an appt. Who is her GI.  I don't see anyone listed on her care team.

## 2015-01-28 ENCOUNTER — Ambulatory Visit: Payer: Medicare Other | Admitting: Family Medicine

## 2015-01-31 NOTE — Progress Notes (Signed)
HPI: 44 year old female for evaluation of palpitations. Patient seen previously in this office but not since 01/23/2012. CardioNet monitor in August of 2013 showed sinus to sinus tachycardia. Echocardiogram in July 2013 showed normal LV function, grade 2 diastolic dysfunction but no significant valvular disease. Potassium in September 2016 normal. TSH October 2016 was normal. Patient states for the past year she has noticed elevated heart rate. It can be as high as 110. She does not have significant dyspnea on exertion, orthopnea, PND, exertional chest pain or syncope. Occasional mild pedal edema by her report.  Current Outpatient Prescriptions  Medication Sig Dispense Refill  . aspirin 325 MG tablet Take 325 mg by mouth daily.    . Azelaic Acid 15 % cream Apply 1 application topically 2 (two) times daily. After skin is thoroughly washed and patted dry, gently but thoroughly massage a thin film of azelaic acid cream into the affected area twice daily, in the morning and evening. 50 g 1  . clonazePAM (KLONOPIN) 1 MG tablet TAKE 1 TABLET BY MOUTH TWICE DAILY 60 tablet 0  . dexlansoprazole (DEXILANT) 60 MG capsule Take 1 capsule (60 mg total) by mouth daily. 30 capsule 2  . DUEXIS 800-26.6 MG TABS   6  . Gabapentin Enacarbil 600 MG TBCR Take 600 mg by mouth daily. 30 tablet 6  . hydrOXYzine (VISTARIL) 25 MG capsule Take 1 capsule (25 mg total) by mouth at bedtime as needed. Need follow up appointment for more refills 30 capsule 0  . L-Methylfolate-Algae (DEPLIN 15) 15-90.314 MG CAPS Take 1 capsule by mouth daily. 30 capsule prn  . levothyroxine (SYNTHROID, LEVOTHROID) 25 MCG tablet Take 37.5 mcg by mouth daily before breakfast.    . metFORMIN (GLUCOPHAGE-XR) 500 MG 24 hr tablet Take 500 mg by mouth daily with breakfast.  1  . Methylfol-Algae-B12-Acetylcyst (CEREFOLIN NAC) 6-90.314-2-600 MG TABS Take 1 tablet by mouth daily. 30 tablet prn  . ondansetron (ZOFRAN ODT) 4 MG disintegrating tablet Take  1 tablet (4 mg total) by mouth every 8 (eight) hours as needed for nausea or vomiting. 20 tablet 0  . oxyCODONE-acetaminophen (PERCOCET/ROXICET) 5-325 MG per tablet Take 1 tablet by mouth every 4 (four) hours as needed. 15 tablet 0  . TEGRETOL-XR 200 MG 12 hr tablet Take 3 tablets (600 mg total) by mouth at bedtime. 90 tablet 1  . traMADol (ULTRAM) 50 MG tablet Take 1 tablet (50 mg total) by mouth every 12 (twelve) hours as needed. 30 tablet 0  . verapamil (VERELAN PM) 240 MG 24 hr capsule Take 1 capsule (240 mg total) by mouth at bedtime. 30 capsule 5  . ZOMIG 5 MG nasal solution 5 mg.  0   No current facility-administered medications for this visit.    Allergies  Allergen Reactions  . Eletriptan Nausea And Vomiting  . Lisinopril Other (See Comments)    Cough   . Relpax [Eletriptan Hydrobromide] Nausea And Vomiting  . Zyprexa [Olanzapine] Other (See Comments)    Hallucinations     Past Medical History  Diagnosis Date  . Anxiety   . Bipolar 1 disorder (HCC)     rapid cycler-- Dr Yehuda Budd  . Hypertension   . Hypothyroidism     Dr Starleen Blue ([place])  . Water retention   . GERD (gastroesophageal reflux disease)     GI Dr Percell Miller  . Interstitial cystitis   . Migraine   . Pulmonary embolism ([place]) 01/25/11  . Polycystic ovarian disease   . Peptic  ulcer disease   . Lupus ([place])   . Fibromyalgia     Past Surgical History  Procedure Laterality Date  . Cholecystectomy  2004    Social History   Social History  . Marital Status: Married    Spouse Name: N/A  . Number of Children: N/A  . Years of Education: N/A   Occupational History  .      Warden/ranger   Social History Main Topics  . Smoking status: Never Smoker   . Smokeless tobacco: Never Used  . Alcohol Use: Yes     Comment: rarely  . Drug Use: No  . Sexual Activity: No   Other Topics Concern  . Not on file   Social History Narrative    Family History  Problem Relation Age of Onset  . Anxiety disorder  Other   . Depression Other   . Hypertension Father   . Urolithiasis Father   . Diabetes Mother     ROS:  Patient complains of fatigue but no fevers or chills, productive cough, hemoptysis, dysphasia, odynophagia, melena, hematochezia, dysuria, hematuria, rash, seizure activity, orthopnea, PND, pedal edema, claudication. Remaining systems are negative.  Physical Exam:   Blood pressure 128/84, pulse 98, height 5\' 3"  (1.6 m), weight 102.513 kg (226 lb), last menstrual period 01/10/2015.  General:  Well developed/obese in NAD Skin warm/dry, tattoo Patient not depressed No peripheral clubbing Back-normal HEENT-normal/normal eyelids Neck supple/normal carotid upstroke bilaterally; no bruits; no JVD; no thyromegaly chest - CTA/ normal expansion CV - RRR/normal S1 and S2; no murmurs, rubs or gallops;  PMI nondisplaced Abdomen -NT/ND, no HSM, no mass, + bowel sounds, no bruit 2+ femoral pulses, no bruits Ext-no edema, chords, 2+ DP Neuro-grossly nonfocal  ECG Normal sinus rhythm at a rate of 94. Left axis deviation. Low voltage.

## 2015-02-01 NOTE — Telephone Encounter (Signed)
  Left message for Dr Debroah Loop assistant to call back.    [facility]: Kathryne Hitch, MD  Address: [id number], [place], [place] [address]  [phone]

## 2015-02-02 ENCOUNTER — Encounter: Payer: Self-pay | Admitting: Cardiology

## 2015-02-02 ENCOUNTER — Ambulatory Visit (INDEPENDENT_AMBULATORY_CARE_PROVIDER_SITE_OTHER): Payer: 59 | Admitting: Cardiology

## 2015-02-02 VITALS — BP 128/84 | HR 98 | Ht 63.0 in | Wt 226.0 lb

## 2015-02-02 DIAGNOSIS — R002 Palpitations: Secondary | ICD-10-CM | POA: Diagnosis not present

## 2015-02-02 DIAGNOSIS — I10 Essential (primary) hypertension: Secondary | ICD-10-CM

## 2015-02-02 NOTE — Patient Instructions (Signed)
Your physician recommends that you schedule a follow-up appointment in: as needed  

## 2015-02-02 NOTE — Assessment & Plan Note (Signed)
Blood pressure controlled. Continue present medications. 

## 2015-02-02 NOTE — Assessment & Plan Note (Signed)
Patient complains of intermittent elevated heart rate. Recent TSH and hemoglobin normal. Previous echocardiogram showed normal LV function and monitor showed no significant arrhythmia. I do not think we need to pursue further evaluation at this point. I explained that she may have some degree of sinus tachycardia with activities, stress, fevers, pain etc. We have not found a significant rhythm disturbance.

## 2015-02-03 ENCOUNTER — Telehealth: Payer: Self-pay | Admitting: Family Medicine

## 2015-02-03 NOTE — Telephone Encounter (Signed)
Received fax for prior authorization on [employer] sent through cover my meds waiting on authorization. - CF

## 2015-02-07 NOTE — Telephone Encounter (Signed)
Received fax from [employer] and they state medication is on plan's list of covered drugs. Reference # [id number] - CF

## 2015-02-09 NOTE — Telephone Encounter (Signed)
Dr Debroah Loop office called and they are trying to get her in for endoscopy.

## 2015-02-10 ENCOUNTER — Other Ambulatory Visit: Payer: Self-pay | Admitting: Family Medicine

## 2015-02-15 ENCOUNTER — Ambulatory Visit: Payer: 59 | Admitting: Cardiology

## 2015-02-16 ENCOUNTER — Telehealth: Payer: Self-pay

## 2015-02-16 MED ORDER — CLONAZEPAM 1 MG PO TABS: 1.0000 mg | ORAL_TABLET | Freq: Two times a day (BID) | ORAL | Status: DC

## 2015-02-16 NOTE — Telephone Encounter (Signed)
Refill for clonazepam 

## 2015-02-17 ENCOUNTER — Other Ambulatory Visit: Payer: Self-pay | Admitting: Family Medicine

## 2015-02-18 ENCOUNTER — Telehealth: Payer: Self-pay | Admitting: *Deleted

## 2015-02-18 DIAGNOSIS — R1012 Left upper quadrant pain: Secondary | ICD-10-CM | POA: Diagnosis not present

## 2015-02-18 DIAGNOSIS — K2970 Gastritis, unspecified, without bleeding: Secondary | ICD-10-CM | POA: Diagnosis not present

## 2015-02-18 MED ORDER — ONDANSETRON 4 MG PO TBDP: 4.0000 mg | ORAL_TABLET | Freq: Three times a day (TID) | ORAL | Status: DC | PRN

## 2015-02-18 MED ORDER — HYDROXYZINE PAMOATE 25 MG PO CAPS: 25.0000 mg | ORAL_CAPSULE | Freq: Every evening | ORAL | Status: DC | PRN

## 2015-02-18 MED ORDER — VERAPAMIL HCL ER 240 MG PO CP24: 240.0000 mg | ORAL_CAPSULE | Freq: Every day | ORAL | Status: DC

## 2015-02-18 NOTE — Telephone Encounter (Signed)
Pt called and wanted to inform Dr. Madilyn Fireman that she had her endoscopy done.Diane [name] Diane [name]

## 2015-02-22 ENCOUNTER — Other Ambulatory Visit: Payer: Self-pay | Admitting: Family Medicine

## 2015-02-23 DIAGNOSIS — D259 Leiomyoma of uterus, unspecified: Secondary | ICD-10-CM | POA: Diagnosis not present

## 2015-02-23 DIAGNOSIS — D252 Subserosal leiomyoma of uterus: Secondary | ICD-10-CM | POA: Diagnosis not present

## 2015-02-23 DIAGNOSIS — D251 Intramural leiomyoma of uterus: Secondary | ICD-10-CM | POA: Diagnosis not present

## 2015-03-08 DIAGNOSIS — R109 Unspecified abdominal pain: Secondary | ICD-10-CM | POA: Diagnosis not present

## 2015-03-08 DIAGNOSIS — R1013 Epigastric pain: Secondary | ICD-10-CM | POA: Diagnosis not present

## 2015-03-14 ENCOUNTER — Other Ambulatory Visit: Payer: Self-pay

## 2015-03-14 MED ORDER — TEGRETOL-XR 200 MG PO TB12: 600.0000 mg | ORAL_TABLET | Freq: Every day | ORAL | Status: DC

## 2015-03-19 ENCOUNTER — Other Ambulatory Visit: Payer: Self-pay | Admitting: Family Medicine

## 2015-03-21 ENCOUNTER — Other Ambulatory Visit: Payer: Self-pay | Admitting: Family Medicine

## 2015-03-21 MED ORDER — HYDROXYZINE PAMOATE 25 MG PO CAPS: 50.0000 mg | ORAL_CAPSULE | Freq: Every evening | ORAL | Status: DC | PRN

## 2015-03-21 NOTE — Telephone Encounter (Signed)
Patient was advised she will need to schedule a follow up visit.

## 2015-04-12 ENCOUNTER — Telehealth: Payer: Self-pay

## 2015-04-12 DIAGNOSIS — D259 Leiomyoma of uterus, unspecified: Secondary | ICD-10-CM | POA: Diagnosis not present

## 2015-04-12 DIAGNOSIS — N83201 Unspecified ovarian cyst, right side: Secondary | ICD-10-CM | POA: Diagnosis not present

## 2015-04-12 DIAGNOSIS — D252 Subserosal leiomyoma of uterus: Secondary | ICD-10-CM | POA: Diagnosis not present

## 2015-04-19 ENCOUNTER — Other Ambulatory Visit: Payer: Self-pay | Admitting: Family Medicine

## 2015-04-19 NOTE — Telephone Encounter (Signed)
Clonazepam and horizant refilled.

## 2015-04-21 ENCOUNTER — Ambulatory Visit: Payer: 59 | Admitting: Family Medicine

## 2015-05-12 ENCOUNTER — Encounter: Payer: Self-pay | Admitting: Family Medicine

## 2015-05-12 ENCOUNTER — Ambulatory Visit (INDEPENDENT_AMBULATORY_CARE_PROVIDER_SITE_OTHER): Payer: 59 | Admitting: Family Medicine

## 2015-05-12 VITALS — BP 137/91 | HR 109 | Ht 63.0 in | Wt 227.0 lb

## 2015-05-12 DIAGNOSIS — Z86711 Personal history of pulmonary embolism: Secondary | ICD-10-CM | POA: Diagnosis not present

## 2015-05-12 DIAGNOSIS — G4700 Insomnia, unspecified: Secondary | ICD-10-CM

## 2015-05-12 DIAGNOSIS — G43009 Migraine without aura, not intractable, without status migrainosus: Secondary | ICD-10-CM

## 2015-05-12 DIAGNOSIS — M25571 Pain in right ankle and joints of right foot: Secondary | ICD-10-CM

## 2015-05-12 DIAGNOSIS — Z79899 Other long term (current) drug therapy: Secondary | ICD-10-CM | POA: Diagnosis not present

## 2015-05-12 DIAGNOSIS — K253 Acute gastric ulcer without hemorrhage or perforation: Secondary | ICD-10-CM | POA: Diagnosis not present

## 2015-05-12 MED ORDER — ONDANSETRON 4 MG PO TBDP: 4.0000 mg | ORAL_TABLET | Freq: Three times a day (TID) | ORAL | Status: DC | PRN

## 2015-05-12 MED ORDER — CLONAZEPAM 1 MG PO TABS: 1.0000 mg | ORAL_TABLET | Freq: Two times a day (BID) | ORAL | Status: DC

## 2015-05-12 MED ORDER — TEGRETOL-XR 200 MG PO TB12: 600.0000 mg | ORAL_TABLET | Freq: Every day | ORAL | Status: DC

## 2015-05-12 MED ORDER — TRAMADOL HCL 50 MG PO TABS: 50.0000 mg | ORAL_TABLET | Freq: Two times a day (BID) | ORAL | Status: DC | PRN

## 2015-05-12 MED ORDER — HYDROXYZINE PAMOATE 25 MG PO CAPS: 50.0000 mg | ORAL_CAPSULE | Freq: Every evening | ORAL | Status: DC | PRN

## 2015-05-12 MED ORDER — SUVOREXANT 10 MG PO TABS: 10.0000 mg | ORAL_TABLET | Freq: Every day | ORAL | Status: DC

## 2015-05-12 MED ORDER — GABAPENTIN ENACARBIL ER 600 MG PO TBCR: 1.0000 | EXTENDED_RELEASE_TABLET | Freq: Every day | ORAL | Status: DC

## 2015-05-12 NOTE — Progress Notes (Signed)
   Subjective:    Patient ID: Diane [name], female    DOB: 08/02/1970, 45 y.o.   MRN: [id number]  HPI She wants to know if she can have a colonic with her hx of a blood clot.would also like to have a PT/INR checked as well.    Right ankle pain x 3 weeks.  Pain is mostly on the outside of the ankle.  No injury.  No swelling. Painful with walking.    Bipolar 1 D/O - She is doing well. She needs a Tegretol level.  She was seeing a therapist but they were making her pay   Her migraines are not well controlled.   Gastric ulcer - she is only taking her Dexilant PRN.    Insomnia- still not sleeping well. She has tried multiple sleep aid in the past.  She still is only sleeping for a few hours at a time and then wakes up and can't fall back to sleep. She is still taking the Vistaril but it's really not helping.  Review of Systems     Objective:   Physical Exam  Constitutional: She is oriented to person, place, and time. She appears well-developed and well-nourished.  HENT:  Head: Normocephalic and atraumatic.  Cardiovascular: Normal rate, regular rhythm and normal heart sounds.   Pulmonary/Chest: Effort normal and breath sounds normal.  Musculoskeletal:  Right ankle with normal flexion extension. Strength is 5 out of 5 in all directions. No increased laxity with anterior drawer test. She is tender at the base of the lateral malleolus and over the top of the proximal foot. No bruising or apparent swelling. No pain over the metatarsals.  Neurological: She is alert and oriented to person, place, and time.  Skin: Skin is warm and dry.  Psychiatric: She has a normal mood and affect. Her behavior is normal.          Assessment & Plan:  Right ankle pain 3 weeks-no known injury but she is very tender over the talus and at the base of the lateral malleolus.  Migraine headaches - not well controlled but continued work with headache specialist. In fact she's scheduled for injection  seen. She does get some relief with this.  Insomnia  - recommend trial of Belsomra. Start with 10 mg and if after one week we need to go up to 15 mg we can. We'll continue to go up until the dose is therapeutic. She did verify with her insurance that is covered but it is tear 3.  Gastric ulcer - every day for 6 week, and then can go back to as needed.   Bipolar 1 disorder-I did refill her Tegretol. We will check a level. Also discussed that she still needs to work on finding a psychiatrist. I have been filling her medications for over a year. I discussed with her that I want her to get call me with the name of someone that she needs a referral to within the next 30 days.  Time spent 45 min, > 50% think counseling about migraine headaches, ulcer, insomnia, bipolar disorder, and ankle pain.

## 2015-05-12 NOTE — Patient Instructions (Signed)
Suvorexant oral tablets What is this medicine? SUVOREXANT (su-vor-EX-ant) is used to treat insomnia. This medicine helps you to fall asleep and sleep through the night. This medicine may be used for other purposes; ask your health care provider or pharmacist if you have questions. What should I tell my health care provider before I take this medicine? They need to know if you have any of these conditions: -depression -history of a drug or alcohol abuse problem -history of daytime sleepiness -history of sudden onset of muscle weakness (cataplexy) -liver disease -lung or breathing disease -narcolepsy -suicidal thoughts, plans, or attempt; a previous suicide attempt by you or a family member -an unusual or allergic reaction to suvorexant, other medicines, foods, dyes, or preservatives -pregnant or trying to get pregnant -breast-feeding How should I use this medicine? Take this medicine by mouth within 30 minutes of going to bed. Do not take it unless you are able to stay in bed a full night before you must be active again. Follow the directions on the prescription label. For best results, it is better to take this medicine on an empty stomach. Do not take your medicine more often than directed. Do not stop taking this medicine on your own. Always follow your doctor or health care professional's advice. A special MedGuide will be given to you by the pharmacist with each prescription and refill. Be sure to read this information carefully each time. Talk to your pediatrician regarding the use of this medicine in children. Special care may be needed. Overdosage: If you think you have taken too much of this medicine contact a poison control center or emergency room at once. NOTE: This medicine is only for you. Do not share this medicine with others. What if I miss a dose? This medicine should only be taken immediately before going to sleep. Do not take double or extra doses. What may interact with  this medicine? -alcohol -antiviral medicines for HIV or AIDS -aprepitant -carbamazepine -certain antibiotics like ciprofloxacin, clarithromycin, erythromycin, telithromycin -certain medicines for depression or psychotic disturbances -certain medicines for fungal infections like ketoconazole, posaconazole, fluconazole, or itraconazole -conivaptan -digoxin -diltiazem -grapefruit juice -imatinib -medicines for anxiety or sleep -phenytoin -rifampin -verapamil This list may not describe all possible interactions. Give your health care provider a list of all the medicines, herbs, non-prescription drugs, or dietary supplements you use. Also tell them if you smoke, drink alcohol, or use illegal drugs. Some items may interact with your medicine. What should I watch for while using this medicine? Visit your doctor or health care professional for regular checks on your progress. Keep a regular sleep schedule by going to bed at about the same time each night. Avoid caffeine-containing drinks in the evening hours. When sleep medicines are used every night for more than a few weeks, they may stop working. Do not increase the dose on your own. Talk to your doctor if your insomnia worsens or is not better within 7 to 10 days. After taking this medicine for sleep, you may get up out of bed while not being fully awake and do an activity that you do not know you are doing. The next morning, you may have no memory of the event. Activities such as driving a car ("sleep-driving"), making and eating food, talking on the phone, sexual activity, and sleep-walking have been reported. Call your doctor right away if you find out you have done any of these activities. Do not take this medicine if you have used alcohol that evening   or before bed or taken another medicine for sleep, since your risk of doing these sleep-related activities will be increased. Do not take this medicine unless you are able to stay in bed for a  full night (7 to 8 hours) and do not drive or perform other activities requiring full alertness within 8 hours of a dose. Do not drive, use machinery, or do anything that needs mental alertness the day after you take the 20 mg dose of this medicine. The use of lower doses (10 mg) also has the potential to cause driving impairment the next day. You may have a decrease in mental alertness the day after use, even if you feel that you are fully awake. Tell your doctor if you will need to perform activities requiring full alertness, such as driving, the next day. Do not stand or sit up quickly after taking this medicine, especially if you are an older patient. This reduces the risk of dizzy or fainting spells. If you or your family notice any changes in your behavior, such as new or worsening depression, thoughts of harming yourself, anxiety, other unusual or disturbing thoughts, or memory loss, call your doctor right away. What side effects may I notice from receiving this medicine? Side effects that you should report to your doctor or health care professional as soon as possible: -allergic reactions like skin rash, itching or hives, swelling of the face, lips, or tongue -confusion -depressed mood -feeling faint or lightheaded, falls -hallucinations -inability to move or speak for up to several minutes while you are going to sleep or waking up -memory loss -periods of leg weakness lasting from seconds to a few minutes -problems with balance, speaking, walking -restlessness, excitability, or feelings of agitation -unusual activities while asleep like driving, eating, making phone calls Side effects that usually do not require medical attention (Report these to your doctor or health care professional if they continue or are bothersome.): -abnormal dreams -daytime drowsiness -diarrhea -dizziness -headache This list may not describe all possible side effects. Call your doctor for medical advice about  side effects. You may report side effects to FDA at 1-800-FDA-1088. Where should I keep my medicine? Keep out of the reach of children. This medicine can be abused. Keep your medicine in a safe place to protect it from theft. Do not share this medicine with anyone. Selling or giving away this medicine is dangerous and against the law. Store at room temperature between 15 and 30 degrees C (59 and 86 degrees F). Throw away any unused medicine after the expiration date. NOTE: This sheet is a summary. It may not cover all possible information. If you have questions about this medicine, talk to your doctor, pharmacist, or health care provider.    2016, Elsevier/Gold Standard. (2014-09-27 13:22:51)  

## 2015-05-13 LAB — PROTIME-INR
INR: 0.96 (ref <1.50)
PROTHROMBIN TIME: 12.9 s (ref 11.6–15.2)

## 2015-05-13 LAB — CARBAMAZEPINE LEVEL, TOTAL: CARBAMAZEPINE LVL: 7.6 mg/L (ref 4.0–12.0)

## 2015-05-16 DIAGNOSIS — G43709 Chronic migraine without aura, not intractable, without status migrainosus: Secondary | ICD-10-CM | POA: Diagnosis not present

## 2015-06-09 DIAGNOSIS — K582 Mixed irritable bowel syndrome: Secondary | ICD-10-CM | POA: Diagnosis not present

## 2015-06-12 ENCOUNTER — Other Ambulatory Visit: Payer: Self-pay | Admitting: Family Medicine

## 2015-06-15 ENCOUNTER — Telehealth: Payer: Self-pay | Admitting: Family Medicine

## 2015-06-15 NOTE — Telephone Encounter (Signed)
I know she had asked me about a colon cleanse during her appointment. I really could not find any specific literature in regards to her question. Suddenly gets up to her what she would like to do. In addition I know she had requested a letter for the [facility]. I just need more detailed information about what she needs a letter to address as far as desire to exercise etc.

## 2015-06-22 NOTE — Telephone Encounter (Signed)
Pt informed. She stated that she would like to get a letter stating that it would be ok to have a colonic done, and will get back with regards to the [facility].Audelia Hives [place]

## 2015-06-23 DIAGNOSIS — E039 Hypothyroidism, unspecified: Secondary | ICD-10-CM | POA: Diagnosis not present

## 2015-06-23 DIAGNOSIS — E282 Polycystic ovarian syndrome: Secondary | ICD-10-CM | POA: Diagnosis not present

## 2015-06-23 NOTE — Telephone Encounter (Signed)
Letter sent to PO box.

## 2015-06-23 NOTE — Telephone Encounter (Signed)
Letter printed.

## 2015-06-23 NOTE — Telephone Encounter (Signed)
Letter  

## 2015-06-23 NOTE — Telephone Encounter (Signed)
Left message for patient that letter is complete, and to see if she wants it mailed, faxed, or if she pick it up.

## 2015-07-08 ENCOUNTER — Other Ambulatory Visit: Payer: Self-pay | Admitting: Family Medicine

## 2015-07-08 ENCOUNTER — Telehealth: Payer: Self-pay

## 2015-07-08 NOTE — Telephone Encounter (Signed)
Weaned as her appointment with psychiatry? I'm not one to refill her medications until she gets an appointment scheduled with a psychiatrist

## 2015-07-08 NOTE — Telephone Encounter (Signed)
Notified patient.

## 2015-07-22 ENCOUNTER — Encounter: Payer: Self-pay | Admitting: Emergency Medicine

## 2015-07-22 ENCOUNTER — Emergency Department: Payer: 59

## 2015-07-22 ENCOUNTER — Emergency Department
Admission: EM | Admit: 2015-07-22 | Discharge: 2015-07-22 | Disposition: A | Discharge status: Home / Self Care | Payer: 59 | Attending: Emergency Medicine | Admitting: Emergency Medicine

## 2015-07-22 DIAGNOSIS — S39012A Strain of muscle, fascia and tendon of lower back, initial encounter: Secondary | ICD-10-CM | POA: Insufficient documentation

## 2015-07-22 DIAGNOSIS — F315 Bipolar disorder, current episode depressed, severe, with psychotic features: Secondary | ICD-10-CM | POA: Diagnosis not present

## 2015-07-22 DIAGNOSIS — E669 Obesity, unspecified: Secondary | ICD-10-CM | POA: Diagnosis not present

## 2015-07-22 DIAGNOSIS — S63502A Unspecified sprain of left wrist, initial encounter: Secondary | ICD-10-CM | POA: Diagnosis not present

## 2015-07-22 DIAGNOSIS — S161XXA Strain of muscle, fascia and tendon at neck level, initial encounter: Secondary | ICD-10-CM

## 2015-07-22 DIAGNOSIS — Y92411 Interstate highway as the place of occurrence of the external cause: Secondary | ICD-10-CM | POA: Diagnosis not present

## 2015-07-22 DIAGNOSIS — Y9389 Activity, other specified: Secondary | ICD-10-CM | POA: Insufficient documentation

## 2015-07-22 DIAGNOSIS — I2699 Other pulmonary embolism without acute cor pulmonale: Secondary | ICD-10-CM | POA: Insufficient documentation

## 2015-07-22 DIAGNOSIS — Z79899 Other long term (current) drug therapy: Secondary | ICD-10-CM | POA: Insufficient documentation

## 2015-07-22 DIAGNOSIS — M1990 Unspecified osteoarthritis, unspecified site: Secondary | ICD-10-CM | POA: Diagnosis not present

## 2015-07-22 DIAGNOSIS — Z794 Long term (current) use of insulin: Secondary | ICD-10-CM | POA: Insufficient documentation

## 2015-07-22 DIAGNOSIS — E039 Hypothyroidism, unspecified: Secondary | ICD-10-CM | POA: Insufficient documentation

## 2015-07-22 DIAGNOSIS — I10 Essential (primary) hypertension: Secondary | ICD-10-CM | POA: Insufficient documentation

## 2015-07-22 DIAGNOSIS — G43909 Migraine, unspecified, not intractable, without status migrainosus: Secondary | ICD-10-CM | POA: Insufficient documentation

## 2015-07-22 DIAGNOSIS — Y999 Unspecified external cause status: Secondary | ICD-10-CM | POA: Insufficient documentation

## 2015-07-22 DIAGNOSIS — Z7982 Long term (current) use of aspirin: Secondary | ICD-10-CM | POA: Diagnosis not present

## 2015-07-22 DIAGNOSIS — S3992XA Unspecified injury of lower back, initial encounter: Secondary | ICD-10-CM | POA: Diagnosis present

## 2015-07-22 MED ORDER — ACETAMINOPHEN 325 MG PO TABS
650.0000 mg | ORAL_TABLET | Freq: Once | ORAL | Status: AC
  Administered 2015-07-22: 650 mg via ORAL

## 2015-07-22 MED ORDER — ACETAMINOPHEN 325 MG PO TABS
ORAL_TABLET | ORAL | Status: AC
  Filled 2015-07-22: qty 2
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
## 2015-07-22 NOTE — ED Notes (Signed)
POCT PREG NEG

## 2015-07-28 DIAGNOSIS — M2550 Pain in unspecified joint: Secondary | ICD-10-CM | POA: Diagnosis not present

## 2015-07-28 DIAGNOSIS — M791 Myalgia: Secondary | ICD-10-CM | POA: Diagnosis not present

## 2015-07-28 DIAGNOSIS — M329 Systemic lupus erythematosus, unspecified: Secondary | ICD-10-CM | POA: Diagnosis not present

## 2015-08-09 ENCOUNTER — Other Ambulatory Visit: Payer: Self-pay | Admitting: Family Medicine

## 2015-08-10 ENCOUNTER — Other Ambulatory Visit: Payer: Self-pay | Admitting: Family Medicine

## 2015-08-10 MED ORDER — CLONAZEPAM 1 MG PO TABS: 1.0000 mg | ORAL_TABLET | Freq: Two times a day (BID) | ORAL | Status: DC | PRN

## 2015-08-10 MED ORDER — HYDROXYZINE PAMOATE 25 MG PO CAPS: 50.0000 mg | ORAL_CAPSULE | Freq: Every evening | ORAL | Status: DC | PRN

## 2015-08-10 NOTE — Telephone Encounter (Signed)
Patient called office stating she had an appt with Dr. Toy Cookey (Psychiatry) and was advised he "could not help her." Pt states she was given a list of other Providers that could assist her but there is a "month long wait." Spoke with PCP, it was determined Pt needs to schedule an appt with one of the referring Providers and PCP would fill Rx's to last until appt. PCP also wanted me to touch base with Dr. Toy Cookey regarding visit. Called office and left VM for return call.  Called Pt back, advised once she schedules an appt to return clinic call and we will address Rx refill request. Verbalized understanding.

## 2015-08-12 ENCOUNTER — Telehealth: Payer: Self-pay

## 2015-08-15 ENCOUNTER — Telehealth: Payer: Self-pay | Admitting: Family Medicine

## 2015-08-15 NOTE — Telephone Encounter (Signed)
With what provider?  Ok to refill her scripts.
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
## 2015-08-15 NOTE — Telephone Encounter (Signed)
Left VM to see what Provider this appt is with and what Rx's need refills.

## 2015-08-15 NOTE — Telephone Encounter (Signed)
See note from 4/21.  Looks like she hasn't been contacted back to let her know we can refill her meds that are due.

## 2015-08-16 ENCOUNTER — Telehealth: Payer: Self-pay | Admitting: Family Medicine

## 2015-08-16 NOTE — Telephone Encounter (Signed)
I have called patient and left a message for pt to call back and schedule appt with [facility] for a F/u on mood/migraines

## 2015-08-22 NOTE — Telephone Encounter (Signed)
Pt was contacted.Diane [name]

## 2015-08-23 DIAGNOSIS — D259 Leiomyoma of uterus, unspecified: Secondary | ICD-10-CM | POA: Diagnosis not present

## 2015-08-23 DIAGNOSIS — N83202 Unspecified ovarian cyst, left side: Secondary | ICD-10-CM | POA: Diagnosis not present

## 2015-08-23 DIAGNOSIS — N83201 Unspecified ovarian cyst, right side: Secondary | ICD-10-CM | POA: Diagnosis not present

## 2015-08-23 DIAGNOSIS — D252 Subserosal leiomyoma of uterus: Secondary | ICD-10-CM | POA: Diagnosis not present

## 2015-08-25 ENCOUNTER — Ambulatory Visit (INDEPENDENT_AMBULATORY_CARE_PROVIDER_SITE_OTHER): Payer: 59 | Admitting: Family Medicine

## 2015-08-25 ENCOUNTER — Encounter: Payer: Self-pay | Admitting: Family Medicine

## 2015-08-25 VITALS — BP 134/92 | HR 96 | Ht 63.0 in | Wt 224.0 lb

## 2015-08-25 DIAGNOSIS — M797 Fibromyalgia: Secondary | ICD-10-CM | POA: Diagnosis not present

## 2015-08-25 DIAGNOSIS — R5383 Other fatigue: Secondary | ICD-10-CM

## 2015-08-25 DIAGNOSIS — Z13220 Encounter for screening for lipoid disorders: Secondary | ICD-10-CM | POA: Diagnosis not present

## 2015-08-25 DIAGNOSIS — E669 Obesity, unspecified: Secondary | ICD-10-CM

## 2015-08-25 DIAGNOSIS — M545 Low back pain, unspecified: Secondary | ICD-10-CM

## 2015-08-25 DIAGNOSIS — E039 Hypothyroidism, unspecified: Secondary | ICD-10-CM

## 2015-08-25 DIAGNOSIS — R1013 Epigastric pain: Secondary | ICD-10-CM

## 2015-08-25 DIAGNOSIS — M542 Cervicalgia: Secondary | ICD-10-CM

## 2015-08-25 LAB — FOLATE

## 2015-08-25 LAB — CBC
HCT: 35.7 % (ref 35.0–45.0)
HEMOGLOBIN: 11.8 g/dL (ref 11.7–15.5)
MCH: 25.5 pg — AB (ref 27.0–33.0)
MCHC: 33.1 g/dL (ref 32.0–36.0)
MCV: 77.1 fL — AB (ref 80.0–100.0)
MPV: 10.1 fL (ref 7.5–12.5)
PLATELETS: 342 10*3/uL (ref 140–400)
RBC: 4.63 MIL/uL (ref 3.80–5.10)
RDW: 14.6 % (ref 11.0–15.0)
WBC: 8.3 10*3/uL (ref 3.8–10.8)

## 2015-08-25 LAB — FERRITIN: FERRITIN: 10 ng/mL (ref 10–232)

## 2015-08-25 LAB — TSH: TSH: 1.47 m[IU]/L

## 2015-08-25 LAB — VITAMIN B12: Vitamin B-12: >2000 pg/mL — ABNORMAL HIGH (ref 200–1100)
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
## 2015-08-26 LAB — LIPID PANEL
CHOL/HDL RATIO: 3.6 ratio (ref <=5.0)
CHOLESTEROL: 255 mg/dL — AB (ref 125–200)
HDL: 70 mg/dL (ref >=46)
LDL Cholesterol: 149 mg/dL — ABNORMAL HIGH (ref <130)
Triglycerides: 180 mg/dL — ABNORMAL HIGH (ref <150)
VLDL: 36 mg/dL — ABNORMAL HIGH (ref <30)

## 2015-08-26 LAB — COMPLETE METABOLIC PANEL WITH GFR
ALBUMIN: 3.9 g/dL (ref 3.6–5.1)
ALK PHOS: 99 U/L (ref 33–115)
ALT: 13 U/L (ref 6–29)
AST: 13 U/L (ref 10–30)
BILIRUBIN TOTAL: 0.1 mg/dL — AB (ref 0.2–1.2)
BUN: 6 mg/dL — ABNORMAL LOW (ref 7–25)
CALCIUM: 8.9 mg/dL (ref 8.6–10.2)
CO2: 23 mmol/L (ref 20–31)
Chloride: 101 mmol/L (ref 98–110)
Creat: 0.72 mg/dL (ref 0.50–1.10)
Glucose, Bld: 96 mg/dL (ref 65–99)
POTASSIUM: 3.6 mmol/L (ref 3.5–5.3)
Sodium: 136 mmol/L (ref 135–146)
TOTAL PROTEIN: 7 g/dL (ref 6.1–8.1)

## 2015-08-26 LAB — CARBAMAZEPINE LEVEL, TOTAL: Carbamazepine Lvl: 7.1 mg/L (ref 4.0–12.0)

## 2015-08-28 ENCOUNTER — Emergency Department (HOSPITAL_BASED_OUTPATIENT_CLINIC_OR_DEPARTMENT_OTHER)
Admission: EM | Admit: 2015-08-28 | Discharge: 2015-08-28 | Disposition: A | Discharge status: Home / Self Care | Payer: 59 | Attending: Emergency Medicine | Admitting: Emergency Medicine

## 2015-08-28 ENCOUNTER — Encounter (HOSPITAL_BASED_OUTPATIENT_CLINIC_OR_DEPARTMENT_OTHER): Payer: Self-pay | Admitting: *Deleted

## 2015-08-28 DIAGNOSIS — Y939 Activity, unspecified: Secondary | ICD-10-CM | POA: Insufficient documentation

## 2015-08-28 DIAGNOSIS — Z7984 Long term (current) use of oral hypoglycemic drugs: Secondary | ICD-10-CM | POA: Insufficient documentation

## 2015-08-28 DIAGNOSIS — I10 Essential (primary) hypertension: Secondary | ICD-10-CM | POA: Diagnosis not present

## 2015-08-28 DIAGNOSIS — Y929 Unspecified place or not applicable: Secondary | ICD-10-CM | POA: Insufficient documentation

## 2015-08-28 DIAGNOSIS — W57XXXA Bitten or stung by nonvenomous insect and other nonvenomous arthropods, initial encounter: Secondary | ICD-10-CM | POA: Diagnosis not present

## 2015-08-28 DIAGNOSIS — S80862A Insect bite (nonvenomous), left lower leg, initial encounter: Secondary | ICD-10-CM | POA: Insufficient documentation

## 2015-08-28 DIAGNOSIS — F319 Bipolar disorder, unspecified: Secondary | ICD-10-CM | POA: Insufficient documentation

## 2015-08-28 DIAGNOSIS — Y999 Unspecified external cause status: Secondary | ICD-10-CM | POA: Insufficient documentation

## 2015-08-28 DIAGNOSIS — E039 Hypothyroidism, unspecified: Secondary | ICD-10-CM | POA: Diagnosis not present

## 2015-08-28 DIAGNOSIS — Z7982 Long term (current) use of aspirin: Secondary | ICD-10-CM | POA: Insufficient documentation

## 2015-08-28 DIAGNOSIS — Z79899 Other long term (current) drug therapy: Secondary | ICD-10-CM | POA: Insufficient documentation

## 2015-08-28 NOTE — ED Provider Notes (Signed)
CSN: [id number]     Arrival date & time 08/28/15  0733 History   First MD Initiated Contact with Patient 08/28/15 [phone]     Chief Complaint  Patient presents with  . Tick Removal     (Consider location/radiation/quality/duration/timing/severity/associated sxs/prior Treatment) HPI Comments: 45 year old female presents for a tick lodged in the back of her left leg. The patient reports she does take her dog outside and is sometimes in the flowers but is unsure when the tick could've gotten on her. She reports that she noticed it this morning around 6 AM. She said that she felt something funny on her leg. Denies feeling takes in any other location and says that she did check her skin. She reports she has felt well otherwise. She did not attempt removal of the tick at home.   Past Medical History  Diagnosis Date  . Anxiety   . Bipolar 1 disorder (HCC)     rapid cycler-- Dr Yehuda Budd  . Hypertension   . Hypothyroidism     Dr Starleen Blue ([facility])  . Water retention   . GERD (gastroesophageal reflux disease)     GI Dr Percell Miller  . Interstitial cystitis   . Migraine   . Pulmonary embolism ([place]) 01/25/11  . Polycystic ovarian disease   . Peptic ulcer disease   . Lupus ([place])   . Fibromyalgia    Past Surgical History  Procedure Laterality Date  . Cholecystectomy  2004   Family History  Problem Relation Age of Onset  . Anxiety disorder Other   . Depression Other   . Hypertension Father   . Urolithiasis Father   . Diabetes Mother    Social History  Substance Use Topics  . Smoking status: Never Smoker   . Smokeless tobacco: Never Used  . Alcohol Use: Yes     Comment: rarely   OB History    Gravida Para Term Preterm AB TAB SAB Ectopic Multiple Living   0 0             Review of Systems  Constitutional: Negative for fever, chills, appetite change and fatigue.  HENT: Negative for congestion.   Respiratory: Negative for shortness of breath.   Cardiovascular: Negative for chest pain.    Gastrointestinal: Negative for nausea, vomiting and diarrhea.  Genitourinary: Negative for flank pain.  Musculoskeletal: Negative for back pain, joint swelling and gait problem.  Skin: Negative for rash.       Tick lodged in skin  Neurological: Negative for dizziness, weakness and light-headedness.  Hematological: Does not bruise/bleed easily.      Allergies  Eletriptan; Lisinopril; and Relpax  Home Medications   Prior to Admission medications   Medication Sig Start Date End Date Taking? Authorizing Provider  aspirin 325 MG tablet Take 325 mg by mouth daily.   Yes Historical Provider, MD  clonazePAM (KLONOPIN) 1 MG tablet Take 1 tablet (1 mg total) by mouth 2 (two) times daily as needed for anxiety. 08/10/15  Yes Hali Marry, MD  dexlansoprazole (DEXILANT) 60 MG capsule Take 1 capsule (60 mg total) by mouth daily. 01/21/15  Yes Hali Marry, MD  Gabapentin Enacarbil (HORIZANT) 600 MG TBCR Take 1 tablet by mouth daily. 05/12/15  Yes Hali Marry, MD  hydrOXYzine (VISTARIL) 25 MG capsule Take 2 capsules (50 mg total) by mouth at bedtime as needed. 08/10/15  Yes Hali Marry, MD  L-Methylfolate-Algae (DEPLIN 15) 15-90.314 MG CAPS Take 1 capsule by mouth daily. 06/25/14  Yes Barnetta Chapel  Devin Going, MD  levothyroxine (SYNTHROID, LEVOTHROID) 25 MCG tablet Take 37.5 mcg by mouth daily before breakfast.   Yes Historical Provider, MD  metFORMIN (GLUCOPHAGE-XR) 500 MG 24 hr tablet Take 500 mg by mouth daily with breakfast. 01/23/14  Yes Historical Provider, MD  Methylfol-Algae-B12-Acetylcyst (CEREFOLIN NAC) 6-90.314-2-600 MG TABS Take 1 tablet by mouth daily. 06/25/14  Yes Hali Marry, MD  ondansetron (ZOFRAN ODT) 4 MG disintegrating tablet Take 1 tablet (4 mg total) by mouth every 8 (eight) hours as needed for nausea or vomiting. 05/12/15  Yes Hali Marry, MD  oxyCODONE-acetaminophen (PERCOCET/ROXICET) 5-325 MG per tablet Take 1 tablet by mouth every 4  (four) hours as needed. 01/11/15  Yes Kristen N Ward, DO  TEGRETOL-XR 200 MG 12 hr tablet Take 3 tablets (600 mg total) by mouth at bedtime. 05/12/15  Yes Hali Marry, MD  traMADol (ULTRAM) 50 MG tablet Take 1 tablet (50 mg total) by mouth every 12 (twelve) hours as needed. 05/12/15  Yes Hali Marry, MD  verapamil (VERELAN PM) 240 MG 24 hr capsule TAKE 1 CAPSULE BY MOUTH AT BEDTIME 08/11/15  Yes Hali Marry, MD  ZOMIG 5 MG nasal solution 5 mg. 09/11/14  Yes Historical Provider, MD   BP 157/98 mmHg  Pulse 108  Temp(Src) 98.8 F (37.1 C) (Oral)  Resp 20  Ht 5\' 3"  (1.6 m)  Wt 224 lb (101.606 kg)  BMI 39.69 kg/m2  SpO2 100%  LMP 08/12/2015 (Exact Date) Physical Exam  Constitutional: She is oriented to person, place, and time. No distress.  HENT:  Head: Normocephalic and atraumatic.  Eyes: EOM are normal. Pupils are equal, round, and reactive to light.  Neck: Normal range of motion.  Cardiovascular: Regular rhythm and intact distal pulses.   Pulmonary/Chest: Effort normal and breath sounds normal.  Abdominal: Soft. There is no tenderness.  Musculoskeletal: Normal range of motion. She exhibits no edema.  Neurological: She is alert and oriented to person, place, and time.  Skin: No bruising, no ecchymosis and no rash noted. She is not diaphoretic.     Vitals reviewed.   ED Course  .Foreign Body Removal Date/Time: 08/28/2015 8:53 AM Performed by: Lonia Skinner ROE Authorized by: Harvel Quale Consent: Verbal consent obtained. Risks and benefits: risks, benefits and alternatives were discussed Consent given by: patient Required items: required blood products, implants, devices, and special equipment available Patient identity confirmed: verbally with patient Body area: skin General location: lower extremity Location details: left upper leg Patient sedated: no Patient restrained: no Localization method: visualized Removal mechanism: forceps Dressing:  dressing applied Tendon involvement: none Complexity: simple 1 objects recovered. Objects recovered: Tick Post-procedure assessment: foreign body removed Patient tolerance: Patient tolerated the procedure well with no immediate complications Comments: Full tick removed including head. No residual tick left in the skin. Full tick was then placed in to clear tape so the whole tick could be visualized.   (including critical care time) Labs Review Labs Reviewed - No data to display  Imaging Review No results found. I have personally reviewed and evaluated these images and lab results as part of my medical decision-making.   EKG Interpretation None      MDM  Patient was seen and evaluated in stable condition. Tick removed about complication. No sign of engorgement. At this time patient does not require antibiotic or for laxatives as the tick was very small and obviously not engorged. This was discussed with the patient at bedside who expressed understanding. Patient  was discharged in stable condition. She was instructed to check her skin after being outside including the scalp so that takes can be removed promptly if she has another tick bite. Final diagnoses:  Tick bite with subsequent removal of tick    1. Tick bite with removal    Harvel Quale, MD 08/28/15 [phone]

## 2015-08-28 NOTE — Discharge Instructions (Signed)
You were seen and evaluated for the tick in your skin. This tick appeared small and does not look like it was engorged or that it had been there for a long period of time. It is only necessary to have antibiotics if the tick is engorged.  Make sure you checked her skin including her scalp after being outside for take so you can remove them. Ticks need to either be suffocated in tape or burned. Follow-up with your primary care physician as needed.  Tick Bite Information Ticks are insects that attach themselves to the skin. There are many types of ticks. Common types include wood ticks and deer ticks. Sometimes, ticks carry diseases that can make a person very ill. The most common places for ticks to attach themselves are the scalp, neck, armpits, waist, and groin.  HOW CAN YOU PREVENT TICK BITES? Take these steps to help prevent tick bites when you are outdoors:  Wear long sleeves and long pants.  Wear white clothes so you can see ticks more easily.  Tuck your pant legs into your socks.  If walking on a trail, stay in the middle of the trail to avoid brushing against bushes.  Avoid walking through areas with long grass.  Put bug spray on all skin that is showing and along boot tops, pant legs, and sleeve cuffs.  Check clothes, hair, and skin often and before going inside.  Brush off any ticks that are not attached.  Take a shower or bath as soon as possible after being outdoors. HOW SHOULD YOU REMOVE A TICK? Ticks should be removed as soon as possible to help prevent diseases. 1. If latex gloves are available, put them on before trying to remove a tick. 2. Use tweezers to grasp the tick as close to the skin as possible. You may also use curved forceps or a tick removal tool. Grasp the tick as close to its head as possible. Avoid grasping the tick on its body. 3. Pull gently upward until the tick lets go. Do not twist the tick or jerk it suddenly. This may break off the tick's head or mouth  parts. 4. Do not squeeze or crush the tick's body. This could force disease-carrying fluids from the tick into your body. 5. After the tick is removed, wash the bite area and your hands with soap and water or alcohol. 6. Apply a small amount of antiseptic cream or ointment to the bite site. 7. Wash any tools that were used. Do not try to remove a tick by applying a hot match, petroleum jelly, or fingernail polish to the tick. These methods do not work. They may also increase the chances of disease being spread from the tick bite. WHEN SHOULD YOU SEEK HELP? Contact your health care provider if you are unable to remove a tick or if a part of the tick breaks off in the skin. After a tick bite, you need to watch for signs and symptoms of diseases that can be spread by ticks. Contact your health care provider if you develop any of the following:  Fever.  Rash.  Redness and puffiness (swelling) in the area of the tick bite.  Tender, puffy lymph glands.  Watery poop (diarrhea).  Weight loss.  Cough.  Feeling more tired than normal (fatigue).  Muscle, joint, or bone pain.  Belly (abdominal) pain.  Headache.  Change in your level of consciousness.  Trouble walking or moving your legs.  Loss of feeling (numbness) in the legs.  Loss of movement (paralysis).  Shortness of breath.  Confusion.  Throwing up (vomiting) many times.   This information is not intended to replace advice given to you by your health care provider. Make sure you discuss any questions you have with your health care provider.   Document Released: 07/04/2009 Document Revised: 12/10/2012 Document Reviewed: 09/17/2012 Elsevier Interactive Patient Education [employer].

## 2015-08-28 NOTE — ED Notes (Signed)
Pt reports noticing a tick behind her L knee around 0600 this morning. Denies fever.

## 2015-08-29 DIAGNOSIS — G43709 Chronic migraine without aura, not intractable, without status migrainosus: Secondary | ICD-10-CM | POA: Diagnosis not present

## 2015-08-30 DIAGNOSIS — M329 Systemic lupus erythematosus, unspecified: Secondary | ICD-10-CM | POA: Diagnosis not present

## 2015-08-30 DIAGNOSIS — M2550 Pain in unspecified joint: Secondary | ICD-10-CM | POA: Diagnosis not present

## 2015-08-30 DIAGNOSIS — M791 Myalgia: Secondary | ICD-10-CM | POA: Diagnosis not present

## 2015-09-02 ENCOUNTER — Other Ambulatory Visit: Payer: Self-pay | Admitting: Family Medicine

## 2015-09-02 ENCOUNTER — Other Ambulatory Visit: Payer: Self-pay | Admitting: *Deleted

## 2015-09-02 MED ORDER — DEPLIN 15 15-90.314 MG PO CAPS: 1.0000 | ORAL_CAPSULE | Freq: Every day | ORAL | Status: DC

## 2015-09-02 MED ORDER — CEREFOLIN NAC 6-90.314-2-600 MG PO TABS: 1.0000 | ORAL_TABLET | Freq: Every day | ORAL | Status: DC

## 2015-09-03 ENCOUNTER — Other Ambulatory Visit: Payer: Self-pay | Admitting: Family Medicine

## 2015-09-06 ENCOUNTER — Telehealth: Payer: Self-pay | Admitting: Family Medicine

## 2015-09-06 NOTE — Telephone Encounter (Signed)
Patient husband, Diane [name], called and says [name] needs a letter saying that she needs more time to complete assignments as well as for testing. I'll be happy to provide such her at this time because of her psychiatric history but I also encouraged him to speak with her about contacting the school itself as they may have a very specific forms that need to be completed.  Please call patient and let her know that her letter is ready to be picked up.

## 2015-09-06 NOTE — Telephone Encounter (Signed)
Called pt and lvm informing her that letter is up front for her to p/u.Diane [name] [place]

## 2015-09-10 ENCOUNTER — Other Ambulatory Visit: Payer: Self-pay | Admitting: Family Medicine

## 2015-09-12 ENCOUNTER — Other Ambulatory Visit: Payer: Self-pay | Admitting: Family Medicine

## 2015-09-12 ENCOUNTER — Telehealth: Payer: Self-pay

## 2015-09-12 DIAGNOSIS — F411 Generalized anxiety disorder: Secondary | ICD-10-CM

## 2015-09-12 DIAGNOSIS — I10 Essential (primary) hypertension: Secondary | ICD-10-CM

## 2015-09-12 NOTE — Telephone Encounter (Signed)
Ok to fill 

## 2015-09-13 MED ORDER — VERAPAMIL HCL ER 240 MG PO CP24: 240.0000 mg | ORAL_CAPSULE | Freq: Every day | ORAL | Status: DC

## 2015-09-13 MED ORDER — ONDANSETRON 4 MG PO TBDP: 4.0000 mg | ORAL_TABLET | Freq: Three times a day (TID) | ORAL | Status: DC | PRN

## 2015-09-13 MED ORDER — CLONAZEPAM 1 MG PO TABS: 1.0000 mg | ORAL_TABLET | Freq: Two times a day (BID) | ORAL | Status: DC | PRN

## 2015-09-13 MED ORDER — GABAPENTIN ENACARBIL ER 600 MG PO TBCR: 1.0000 | EXTENDED_RELEASE_TABLET | Freq: Every day | ORAL | Status: DC

## 2015-09-13 MED ORDER — HYDROXYZINE PAMOATE 25 MG PO CAPS: 50.0000 mg | ORAL_CAPSULE | Freq: Every evening | ORAL | Status: DC | PRN

## 2015-09-15 ENCOUNTER — Other Ambulatory Visit: Payer: Self-pay | Admitting: *Deleted

## 2015-09-20 DIAGNOSIS — G43709 Chronic migraine without aura, not intractable, without status migrainosus: Secondary | ICD-10-CM | POA: Diagnosis not present

## 2015-09-20 DIAGNOSIS — Z79899 Other long term (current) drug therapy: Secondary | ICD-10-CM | POA: Diagnosis not present

## 2015-09-20 DIAGNOSIS — M797 Fibromyalgia: Secondary | ICD-10-CM | POA: Diagnosis not present

## 2015-09-27 ENCOUNTER — Other Ambulatory Visit: Payer: Self-pay | Admitting: *Deleted

## 2015-09-27 ENCOUNTER — Ambulatory Visit: Payer: 59 | Admitting: Dietician

## 2015-09-27 NOTE — Telephone Encounter (Signed)
PA initiated for Horizant. The patient's plan has a policy exclusion on this medication. The alternative is gabapentin. Also wasn't really sure why the patient is taking this medication. Called the patient and she states she thinks she takes the medication is for her headaches. Called the patient and she states she has coverage also through medicare. And to try silverscripts through medicare. Also the patient has tried regular gabapentin and it is ineffective. The patient states she is going to call the pharm to make sure they ran the medication through the medicare in addition to the united healthcare insurance

## 2015-10-03 NOTE — Telephone Encounter (Signed)
Faxed form to [employer] for Horizant. May not be covered as this is a non formulary medication

## 2015-10-04 DIAGNOSIS — K582 Mixed irritable bowel syndrome: Secondary | ICD-10-CM | POA: Diagnosis not present

## 2015-10-04 DIAGNOSIS — K219 Gastro-esophageal reflux disease without esophagitis: Secondary | ICD-10-CM | POA: Diagnosis not present

## 2015-10-04 DIAGNOSIS — R110 Nausea: Secondary | ICD-10-CM | POA: Diagnosis not present

## 2015-10-04 NOTE — Telephone Encounter (Signed)
Called the patient and she states the topamax makes her really hyper and she cannot take it. She states her neurologist reccommended Grelis(sp) which is another form of gabapentin that may work for her HA's. I did advise her to call neurologist since they reccomended this medication. Also I let her know that sometimes even when medications prescribed are used for so called off label uses but are prescribed by speacialist the insurance may approve it. The patient will call us back to let us know the outcome

## 2015-10-04 NOTE — Telephone Encounter (Signed)
Horizant has been denied by insurance because it is being used for an off label use,migraines.denial letter in box

## 2015-10-04 NOTE — Telephone Encounter (Signed)
Ok, please let her know.  Has she taken topamax before for her migraines?

## 2015-10-10 ENCOUNTER — Telehealth: Payer: Self-pay | Admitting: *Deleted

## 2015-10-10 NOTE — Telephone Encounter (Signed)
Pt called and stated that not having the Horizant has affected her tremendously and would like to restart a PA for this. Pt reports that she has been trying to do things to help compensate for this however, she is not doing well and would like for [place] to resubmit the PA. I informed that I will fwd this to the person who does the PA's for meds but I could not ensure her that this would be a high priority due to the volume of requests that are received daily. Pt thanked me for my efforts.Diane [name]

## 2015-10-12 ENCOUNTER — Other Ambulatory Visit: Payer: Self-pay

## 2015-10-12 ENCOUNTER — Other Ambulatory Visit: Payer: Self-pay | Admitting: Family Medicine

## 2015-10-12 DIAGNOSIS — F411 Generalized anxiety disorder: Secondary | ICD-10-CM

## 2015-10-12 MED ORDER — CLONAZEPAM 1 MG PO TABS: 1.0000 mg | ORAL_TABLET | Freq: Two times a day (BID) | ORAL | Status: DC | PRN

## 2015-10-18 NOTE — Telephone Encounter (Signed)
Letter of appeal written and faxed to [id number]

## 2015-10-22 ENCOUNTER — Other Ambulatory Visit: Payer: Self-pay | Admitting: Family Medicine

## 2015-10-27 ENCOUNTER — Ambulatory Visit (INDEPENDENT_AMBULATORY_CARE_PROVIDER_SITE_OTHER): Payer: Managed Care, Other (non HMO) | Admitting: Family Medicine

## 2015-10-27 VITALS — BP 140/78

## 2015-10-27 DIAGNOSIS — M542 Cervicalgia: Secondary | ICD-10-CM | POA: Diagnosis not present

## 2015-10-27 DIAGNOSIS — G4700 Insomnia, unspecified: Secondary | ICD-10-CM

## 2015-10-27 DIAGNOSIS — I10 Essential (primary) hypertension: Secondary | ICD-10-CM

## 2015-10-27 DIAGNOSIS — F319 Bipolar disorder, unspecified: Secondary | ICD-10-CM | POA: Diagnosis not present

## 2015-10-27 DIAGNOSIS — R300 Dysuria: Secondary | ICD-10-CM

## 2015-10-27 DIAGNOSIS — G43109 Migraine with aura, not intractable, without status migrainosus: Secondary | ICD-10-CM

## 2015-10-27 MED ORDER — RAMELTEON 8 MG PO TABS: 8.0000 mg | ORAL_TABLET | Freq: Every day | ORAL | Status: DC

## 2015-10-27 NOTE — Progress Notes (Signed)
Subjective:    CC:   HPI:  Bipolar 1 Disorder - She does have her new psychiatrist appointment scheduled for the end of July. Okay to refill medications until then. She reports extreme fatigue and some irritability.  Epigastric pain - She did end up seeing her [place] neurologists. They recommended giving her baclofen before supper and bedtime. They switched her Back to Nexium 40 mg which she felt like better. Recommended that she consider restarting her amitriptyline. She has switch back to Nexium and has restarted her amitriptyline but she has not started to use the baclofen yet.  Migraine headaches-her neurologist recently put her on Gralis. She says it actually has been working really helped well for her and she's been very happy with the results thus far.  Insomnia-the hydroxyzine is not working well at all. She's been on it for months and she still waking up multiple times at night. She usually can fall asleep for about the first 3 hours.  Cervical pain with radiculopathy-she's currently working with a Restaurant manager, fast food. Her neurologist recommended a chiropractor who uses the adjuster tool technique.  She is getting pain and numbness in both arms  She c/o of dysuria x 3 days.  No hematuria. Hasn't been hydrating well lately.    Past medical history, Surgical history, Family history not pertinant except as noted below, Social history, Allergies, and medications have been entered into the medical record, reviewed, and corrections made.   Review of Systems: No fevers, chills, night sweats, weight loss, chest pain, or shortness of breath.   Objective:    General: Well Developed, well nourished, and in no acute distress.  Neuro: Alert and oriented x3, extra-ocular muscles intact, sensation grossly intact.  HEENT: Normocephalic, atraumatic  Skin: Warm and dry, no rashes. Cardiac: Regular rate and rhythm, no murmurs rubs or gallops, no lower extremity edema.  Respiratory: Clear to auscultation  bilaterally. Not using accessory muscles, speaking in full sentences.   Impression and Recommendations:   Bipolar disorder-will see psychiatry to establish care at the end of the month. Have a they can take of her prescriptions at that point in time.  Irritable bowel syndrome-she says she is back on her amitriptyline. She is off at [place] back on Nexium. And has not started the baclofen yet. Next  Insomnia-discontinue hydroxyzine. We'll try Rozerem as it is not habit forming. Next  Cervical pain with radiculopathy-I do believe there is a Restaurant manager, fast food in [place] he does the adjustment to school type technique so that she will have to drive all the way [place]. I'll try to get this information for her.  Dysuria-patient unable to leave sample here in the office so we sent her home with an order an eye cup to get a sample later at the lab.

## 2015-10-27 NOTE — Telephone Encounter (Signed)
Appeal denied.letter in providers box

## 2015-10-28 LAB — URINALYSIS, ROUTINE W REFLEX MICROSCOPIC
BILIRUBIN URINE: NEGATIVE
Glucose, UA: NEGATIVE
Ketones, ur: NEGATIVE
LEUKOCYTES UA: NEGATIVE
Nitrite: NEGATIVE
PROTEIN: NEGATIVE
Specific Gravity, Urine: 1.007 (ref 1.001–1.035)
pH: 5.5 (ref 5.0–8.0)

## 2015-10-28 LAB — URINALYSIS, MICROSCOPIC ONLY
BACTERIA UA: NONE SEEN [HPF]
Casts: NONE SEEN [LPF]
Crystals: NONE SEEN [HPF]
Squamous Epithelial / LPF: NONE SEEN [HPF] (ref <=5)
WBC UA: NONE SEEN WBC/HPF (ref <=5)
Yeast: NONE SEEN [HPF]

## 2015-10-29 LAB — URINE CULTURE
COLONY COUNT: NO GROWTH
Organism ID, Bacteria: NO GROWTH

## 2015-11-07 ENCOUNTER — Telehealth: Payer: Self-pay | Admitting: *Deleted

## 2015-11-07 NOTE — Telephone Encounter (Signed)
Faxing form over for Rozerem

## 2015-11-09 NOTE — Telephone Encounter (Signed)
I do see in the chart that patient has been on Hydoxyzine for insomnia and was prescribed Belsomra. I did see mentioned in a progress note that patient has been on multiple sleep aides. Called the patient to see if she could remember some of the sleep aids she has taken in the past. Patient said Lorrin Mais did not work at all and she thinks she tried [place] and it didn't work. Patient could not think of anything else but  States she has been on mulitple medications for sleep and they have all proved to be ineffective and do not sustain her sleep.  Faxed over form

## 2015-11-15 NOTE — Telephone Encounter (Signed)
Pt notified and pharm notified of approval Approved from 08/11/2015 through 11/08/2016

## 2015-11-16 ENCOUNTER — Other Ambulatory Visit: Payer: Self-pay | Admitting: Family Medicine

## 2015-11-16 DIAGNOSIS — F411 Generalized anxiety disorder: Secondary | ICD-10-CM

## 2015-12-02 ENCOUNTER — Telehealth: Payer: Self-pay | Admitting: Genetic Counselor

## 2015-12-02 ENCOUNTER — Ambulatory Visit: Payer: 59 | Admitting: Family Medicine

## 2015-12-02 NOTE — Telephone Encounter (Signed)
Spoke with patient about the possibility of genetic testing.  Her uncle just passed yesterday with CRC.  There is a family history of both CRC and breast cancer.  Patient is concerned about her risk for hereditary cancers, and would like to under go genetic testing.  Discussed that it does not appears that she meets criteria for genetic testing but we could discuss self pay options.  She will call back once things have settled at home.  We discussed that her mother is more closely related to her uncle and therefore would be a more informative person to test.  Patient voiced understanding.

## 2015-12-05 ENCOUNTER — Telehealth: Payer: Self-pay | Admitting: Family Medicine

## 2015-12-05 NOTE — Telephone Encounter (Signed)
Pt called. She wants usual paperwork faxed to her school.  Thank you.

## 2015-12-08 DIAGNOSIS — G43709 Chronic migraine without aura, not intractable, without status migrainosus: Secondary | ICD-10-CM | POA: Diagnosis not present

## 2015-12-08 NOTE — Telephone Encounter (Signed)
Letter written,  And will fax to [employer] .  Letter faxed, confirmation received.Diane [name]

## 2015-12-13 ENCOUNTER — Other Ambulatory Visit: Payer: Self-pay | Admitting: Family Medicine

## 2015-12-19 ENCOUNTER — Other Ambulatory Visit: Payer: Self-pay

## 2015-12-19 ENCOUNTER — Other Ambulatory Visit: Payer: Self-pay | Admitting: Family Medicine

## 2015-12-19 MED ORDER — DEPLIN 15 15-90.314 MG PO CAPS: 1.0000 | ORAL_CAPSULE | Freq: Every day | ORAL | 3 refills | Status: DC

## 2015-12-20 ENCOUNTER — Other Ambulatory Visit: Payer: Self-pay | Admitting: Family Medicine

## 2015-12-23 DIAGNOSIS — F319 Bipolar disorder, unspecified: Secondary | ICD-10-CM | POA: Diagnosis not present

## 2015-12-27 ENCOUNTER — Ambulatory Visit: Payer: 59 | Admitting: Family Medicine

## 2015-12-27 DIAGNOSIS — E039 Hypothyroidism, unspecified: Secondary | ICD-10-CM | POA: Diagnosis not present

## 2015-12-27 DIAGNOSIS — R6889 Other general symptoms and signs: Secondary | ICD-10-CM | POA: Diagnosis not present

## 2015-12-27 DIAGNOSIS — E282 Polycystic ovarian syndrome: Secondary | ICD-10-CM | POA: Diagnosis not present

## 2016-01-02 ENCOUNTER — Ambulatory Visit: Payer: 59 | Admitting: Family Medicine

## 2016-01-04 ENCOUNTER — Other Ambulatory Visit: Payer: Self-pay | Admitting: Family Medicine

## 2016-01-09 LAB — HM PAP SMEAR: HM PAP: NEGATIVE

## 2016-01-10 ENCOUNTER — Ambulatory Visit: Payer: 59 | Admitting: Family Medicine

## 2016-01-17 ENCOUNTER — Observation Stay (HOSPITAL_BASED_OUTPATIENT_CLINIC_OR_DEPARTMENT_OTHER)
Admission: EM | Admit: 2016-01-17 | Discharge: 2016-01-19 | Disposition: A | Discharge status: Home / Self Care | Payer: Managed Care, Other (non HMO) | Attending: Internal Medicine | Admitting: Internal Medicine

## 2016-01-17 ENCOUNTER — Encounter (HOSPITAL_BASED_OUTPATIENT_CLINIC_OR_DEPARTMENT_OTHER): Payer: Self-pay | Admitting: Emergency Medicine

## 2016-01-17 ENCOUNTER — Emergency Department (HOSPITAL_BASED_OUTPATIENT_CLINIC_OR_DEPARTMENT_OTHER): Payer: Managed Care, Other (non HMO)

## 2016-01-17 DIAGNOSIS — E039 Hypothyroidism, unspecified: Secondary | ICD-10-CM | POA: Diagnosis not present

## 2016-01-17 DIAGNOSIS — E876 Hypokalemia: Secondary | ICD-10-CM | POA: Diagnosis not present

## 2016-01-17 DIAGNOSIS — D6862 Lupus anticoagulant syndrome: Secondary | ICD-10-CM | POA: Insufficient documentation

## 2016-01-17 DIAGNOSIS — G9340 Encephalopathy, unspecified: Secondary | ICD-10-CM | POA: Insufficient documentation

## 2016-01-17 DIAGNOSIS — I10 Essential (primary) hypertension: Secondary | ICD-10-CM | POA: Diagnosis not present

## 2016-01-17 DIAGNOSIS — Y92009 Unspecified place in unspecified non-institutional (private) residence as the place of occurrence of the external cause: Secondary | ICD-10-CM | POA: Insufficient documentation

## 2016-01-17 DIAGNOSIS — I6523 Occlusion and stenosis of bilateral carotid arteries: Secondary | ICD-10-CM | POA: Insufficient documentation

## 2016-01-17 DIAGNOSIS — Z8711 Personal history of peptic ulcer disease: Secondary | ICD-10-CM | POA: Insufficient documentation

## 2016-01-17 DIAGNOSIS — G43109 Migraine with aura, not intractable, without status migrainosus: Secondary | ICD-10-CM

## 2016-01-17 DIAGNOSIS — M797 Fibromyalgia: Secondary | ICD-10-CM | POA: Insufficient documentation

## 2016-01-17 DIAGNOSIS — F319 Bipolar disorder, unspecified: Secondary | ICD-10-CM | POA: Insufficient documentation

## 2016-01-17 DIAGNOSIS — K219 Gastro-esophageal reflux disease without esophagitis: Secondary | ICD-10-CM | POA: Diagnosis not present

## 2016-01-17 DIAGNOSIS — E119 Type 2 diabetes mellitus without complications: Secondary | ICD-10-CM | POA: Diagnosis not present

## 2016-01-17 DIAGNOSIS — E282 Polycystic ovarian syndrome: Secondary | ICD-10-CM | POA: Diagnosis not present

## 2016-01-17 DIAGNOSIS — E872 Acidosis: Secondary | ICD-10-CM | POA: Diagnosis not present

## 2016-01-17 DIAGNOSIS — R000 Tachycardia, unspecified: Secondary | ICD-10-CM | POA: Insufficient documentation

## 2016-01-17 DIAGNOSIS — Z7984 Long term (current) use of oral hypoglycemic drugs: Secondary | ICD-10-CM | POA: Insufficient documentation

## 2016-01-17 DIAGNOSIS — G43909 Migraine, unspecified, not intractable, without status migrainosus: Secondary | ICD-10-CM | POA: Diagnosis present

## 2016-01-17 DIAGNOSIS — T39091A Poisoning by salicylates, accidental (unintentional), initial encounter: Secondary | ICD-10-CM | POA: Diagnosis present

## 2016-01-17 DIAGNOSIS — M329 Systemic lupus erythematosus, unspecified: Secondary | ICD-10-CM | POA: Diagnosis not present

## 2016-01-17 DIAGNOSIS — Z888 Allergy status to other drugs, medicaments and biological substances status: Secondary | ICD-10-CM | POA: Insufficient documentation

## 2016-01-17 DIAGNOSIS — F419 Anxiety disorder, unspecified: Secondary | ICD-10-CM | POA: Insufficient documentation

## 2016-01-17 DIAGNOSIS — G4733 Obstructive sleep apnea (adult) (pediatric): Secondary | ICD-10-CM | POA: Diagnosis not present

## 2016-01-17 DIAGNOSIS — Z8719 Personal history of other diseases of the digestive system: Secondary | ICD-10-CM | POA: Diagnosis not present

## 2016-01-17 DIAGNOSIS — T39011A Poisoning by aspirin, accidental (unintentional), initial encounter: Principal | ICD-10-CM | POA: Insufficient documentation

## 2016-01-17 DIAGNOSIS — E1169 Type 2 diabetes mellitus with other specified complication: Secondary | ICD-10-CM

## 2016-01-17 DIAGNOSIS — Z7982 Long term (current) use of aspirin: Secondary | ICD-10-CM | POA: Insufficient documentation

## 2016-01-17 DIAGNOSIS — Z86711 Personal history of pulmonary embolism: Secondary | ICD-10-CM | POA: Diagnosis present

## 2016-01-17 DIAGNOSIS — E669 Obesity, unspecified: Secondary | ICD-10-CM

## 2016-01-17 HISTORY — DX: Headache: R51

## 2016-01-17 HISTORY — DX: Cervicalgia: M54.2

## 2016-01-17 HISTORY — DX: Low back pain: M54.5

## 2016-01-17 HISTORY — DX: Insect bite (nonvenomous), left lower leg, initial encounter: W57.XXXA

## 2016-01-17 HISTORY — DX: Headache, unspecified: R51.9

## 2016-01-17 HISTORY — DX: Poisoning by salicylates, accidental (unintentional), initial encounter: T39.091A

## 2016-01-17 HISTORY — DX: Depression, unspecified: F32.A

## 2016-01-17 HISTORY — DX: Other chronic pain: G89.29

## 2016-01-17 HISTORY — DX: Low back pain, unspecified: M54.50

## 2016-01-17 HISTORY — DX: Disorder of lipoprotein metabolism, unspecified: E78.9

## 2016-01-17 HISTORY — DX: Insect bite (nonvenomous), left lower leg, initial encounter: S80.862A

## 2016-01-17 HISTORY — DX: Major depressive disorder, single episode, unspecified: F32.9

## 2016-01-17 HISTORY — DX: Concussion with loss of consciousness of unspecified duration, initial encounter: S06.0X9A

## 2016-01-17 HISTORY — DX: Rheumatoid arthritis, unspecified: M06.9

## 2016-01-17 LAB — BASIC METABOLIC PANEL
ANION GAP: 7 (ref 5–15)
Anion gap: 6 (ref 5–15)
BUN: 8 mg/dL (ref 6–20)
BUN: 7 mg/dL (ref 6–20)
CHLORIDE: 112 mmol/L — AB (ref 101–111)
CO2: 20 mmol/L — ABNORMAL LOW (ref 22–32)
CO2: 18 mmol/L — AB (ref 22–32)
Calcium: 7.5 mg/dL — ABNORMAL LOW (ref 8.9–10.3)
Calcium: 7.7 mg/dL — ABNORMAL LOW (ref 8.9–10.3)
Chloride: 112 mmol/L — ABNORMAL HIGH (ref 101–111)
Creatinine, Ser: 0.89 mg/dL (ref 0.44–1.00)
Creatinine, Ser: 0.9 mg/dL (ref 0.44–1.00)
GFR calc Af Amer: >60 mL/min (ref >60)
GFR calc Af Amer: >60 mL/min (ref >60)
GFR calc non Af Amer: >60 mL/min (ref >60)
GFR calc non Af Amer: >60 mL/min (ref >60)
GLUCOSE: 81 mg/dL (ref 65–99)
GLUCOSE: 141 mg/dL — AB (ref 65–99)
POTASSIUM: 3.4 mmol/L — AB (ref 3.5–5.1)
POTASSIUM: 3 mmol/L — AB (ref 3.5–5.1)
Sodium: 138 mmol/L (ref 135–145)
Sodium: 137 mmol/L (ref 135–145)

## 2016-01-17 LAB — CBC WITH DIFFERENTIAL/PLATELET
BASOS PCT: 0 %
Basophils Absolute: 0 10*3/uL (ref 0.0–0.1)
EOS ABS: 0.1 10*3/uL (ref 0.0–0.7)
Eosinophils Relative: 1 %
HEMATOCRIT: 32.3 % — AB (ref 36.0–46.0)
Hemoglobin: 10.3 g/dL — ABNORMAL LOW (ref 12.0–15.0)
Lymphocytes Relative: 18 %
Lymphs Abs: 1.8 10*3/uL (ref 0.7–4.0)
MCH: 23.6 pg — AB (ref 26.0–34.0)
MCHC: 31.9 g/dL (ref 30.0–36.0)
MCV: 74.1 fL — ABNORMAL LOW (ref 78.0–100.0)
MONO ABS: 0.7 10*3/uL (ref 0.1–1.0)
MONOS PCT: 7 %
Neutro Abs: 7.3 10*3/uL (ref 1.7–7.7)
Neutrophils Relative %: 74 %
Platelets: 346 10*3/uL (ref 150–400)
RBC: 4.36 MIL/uL (ref 3.87–5.11)
RDW: 16.4 % — AB (ref 11.5–15.5)
WBC: 9.8 10*3/uL (ref 4.0–10.5)

## 2016-01-17 LAB — URINALYSIS, ROUTINE W REFLEX MICROSCOPIC
Bilirubin Urine: NEGATIVE
GLUCOSE, UA: 100 mg/dL — AB
Hgb urine dipstick: NEGATIVE
Ketones, ur: NEGATIVE mg/dL
LEUKOCYTES UA: NEGATIVE
Nitrite: NEGATIVE
PH: 5.5 (ref 5.0–8.0)
PROTEIN: NEGATIVE mg/dL
SPECIFIC GRAVITY, URINE: 1.008 (ref 1.005–1.030)

## 2016-01-17 LAB — I-STAT VENOUS BLOOD GAS, ED
ACID-BASE DEFICIT: 4 mmol/L — AB (ref 0.0–2.0)
BICARBONATE: 21 mmol/L (ref 20.0–28.0)
O2 SAT: 49 %
PO2 VEN: 29 mmHg — AB (ref 32.0–45.0)
Patient temperature: 99.4
TCO2: 22 mmol/L (ref 0–100)
pCO2, Ven: 39 mmHg — ABNORMAL LOW (ref 44.0–60.0)
pH, Ven: 7.342 (ref 7.250–7.430)

## 2016-01-17 LAB — COMPREHENSIVE METABOLIC PANEL
ALBUMIN: 3.3 g/dL — AB (ref 3.5–5.0)
ALT: 16 U/L (ref 14–54)
ANION GAP: 7 (ref 5–15)
AST: 16 U/L (ref 15–41)
Alkaline Phosphatase: 91 U/L (ref 38–126)
BILIRUBIN TOTAL: 0.2 mg/dL — AB (ref 0.3–1.2)
BUN: 9 mg/dL (ref 6–20)
CO2: 21 mmol/L — AB (ref 22–32)
Calcium: 8.3 mg/dL — ABNORMAL LOW (ref 8.9–10.3)
Chloride: 109 mmol/L (ref 101–111)
Creatinine, Ser: 1.03 mg/dL — ABNORMAL HIGH (ref 0.44–1.00)
GFR calc Af Amer: >60 mL/min (ref >60)
GFR calc non Af Amer: >60 mL/min (ref >60)
GLUCOSE: 84 mg/dL (ref 65–99)
POTASSIUM: 2.8 mmol/L — AB (ref 3.5–5.1)
SODIUM: 137 mmol/L (ref 135–145)
Total Protein: 7.7 g/dL (ref 6.5–8.1)

## 2016-01-17 LAB — HEPATIC FUNCTION PANEL
ALT: 14 U/L (ref 14–54)
AST: 18 U/L (ref 15–41)
Albumin: 3.1 g/dL — ABNORMAL LOW (ref 3.5–5.0)
Alkaline Phosphatase: 81 U/L (ref 38–126)
BILIRUBIN TOTAL: 0.2 mg/dL — AB (ref 0.3–1.2)
Total Protein: 7 g/dL (ref 6.5–8.1)

## 2016-01-17 LAB — SALICYLATE LEVEL
Salicylate Lvl: 30.5 mg/dL (ref 2.8–30.0)
Salicylate Lvl: 22.3 mg/dL (ref 2.8–30.0)
Salicylate Lvl: 21.2 mg/dL (ref 2.8–30.0)

## 2016-01-17 LAB — RAPID URINE DRUG SCREEN, HOSP PERFORMED
AMPHETAMINES: NOT DETECTED
Barbiturates: NOT DETECTED
Benzodiazepines: NOT DETECTED
Cocaine: NOT DETECTED
OPIATES: NOT DETECTED
TETRAHYDROCANNABINOL: NOT DETECTED

## 2016-01-17 LAB — MAGNESIUM: MAGNESIUM: 2.1 mg/dL (ref 1.7–2.4)

## 2016-01-17 LAB — PREGNANCY, URINE: PREG TEST UR: NEGATIVE

## 2016-01-17 LAB — ETHANOL: Alcohol, Ethyl (B): <5 mg/dL (ref <5)

## 2016-01-17 LAB — ACETAMINOPHEN LEVEL: Acetaminophen (Tylenol), Serum: <10 ug/mL — ABNORMAL LOW (ref 10–30)

## 2016-01-17 MED ORDER — GABAPENTIN (ONCE-DAILY) 600 MG PO TABS: 600.0000 mg | ORAL_TABLET | Freq: Every day | ORAL | Status: DC

## 2016-01-17 MED ORDER — SODIUM CHLORIDE 0.9 % IV BOLUS (SEPSIS)
1000.0000 mL | Freq: Once | INTRAVENOUS | Status: AC
  Administered 2016-01-17: 1000 mL via INTRAVENOUS

## 2016-01-17 MED ORDER — POTASSIUM CHLORIDE 10 MEQ/100ML IV SOLN
10.0000 meq | Freq: Once | INTRAVENOUS | Status: AC
  Administered 2016-01-17: 10 meq via INTRAVENOUS
  Filled 2016-01-17: qty 100

## 2016-01-17 MED ORDER — CARBAMAZEPINE ER 400 MG PO TB12
600.0000 mg | ORAL_TABLET | Freq: Every day | ORAL | Status: DC
  Filled 2016-01-17 (×3): qty 1

## 2016-01-17 MED ORDER — POTASSIUM CHLORIDE CRYS ER 20 MEQ PO TBCR
40.0000 meq | EXTENDED_RELEASE_TABLET | Freq: Once | ORAL | Status: AC
  Administered 2016-01-17: 40 meq via ORAL
  Filled 2016-01-17: qty 2

## 2016-01-17 MED ORDER — DIPHENHYDRAMINE HCL 50 MG/ML IJ SOLN
12.5000 mg | Freq: Once | INTRAMUSCULAR | Status: AC
  Administered 2016-01-17: 12.5 mg via INTRAVENOUS
  Filled 2016-01-17: qty 1

## 2016-01-17 MED ORDER — BACLOFEN 10 MG PO TABS
10.0000 mg | ORAL_TABLET | Freq: Three times a day (TID) | ORAL | Status: DC | PRN
  Filled 2016-01-17: qty 1

## 2016-01-17 MED ORDER — ACETAMINOPHEN 325 MG PO TABS
650.0000 mg | ORAL_TABLET | Freq: Four times a day (QID) | ORAL | Status: DC | PRN
  Filled 2016-01-17: qty 2

## 2016-01-17 MED ORDER — ALBUTEROL SULFATE (2.5 MG/3ML) 0.083% IN NEBU: 2.5000 mg | INHALATION_SOLUTION | RESPIRATORY_TRACT | Status: DC | PRN

## 2016-01-17 MED ORDER — ENOXAPARIN SODIUM 40 MG/0.4ML ~~LOC~~ SOLN
40.0000 mg | SUBCUTANEOUS | Status: DC
  Administered 2016-01-18: 40 mg via SUBCUTANEOUS
  Filled 2016-01-17: qty 0.4

## 2016-01-17 MED ORDER — METOCLOPRAMIDE HCL 5 MG/ML IJ SOLN
10.0000 mg | Freq: Once | INTRAMUSCULAR | Status: AC
  Administered 2016-01-17: 10 mg via INTRAVENOUS
  Filled 2016-01-17: qty 2

## 2016-01-17 MED ORDER — PANTOPRAZOLE SODIUM 40 MG PO TBEC
40.0000 mg | DELAYED_RELEASE_TABLET | Freq: Two times a day (BID) | ORAL | Status: DC
  Administered 2016-01-18 – 2016-01-19 (×4): 40 mg via ORAL
  Filled 2016-01-17 (×4): qty 1

## 2016-01-17 MED ORDER — ONDANSETRON HCL 4 MG/2ML IJ SOLN
4.0000 mg | Freq: Four times a day (QID) | INTRAMUSCULAR | Status: DC | PRN
  Administered 2016-01-18 – 2016-01-19 (×2): 4 mg via INTRAVENOUS
  Filled 2016-01-17 (×2): qty 2

## 2016-01-17 MED ORDER — VERAPAMIL HCL ER 240 MG PO TBCR
240.0000 mg | EXTENDED_RELEASE_TABLET | Freq: Every day | ORAL | Status: DC
  Administered 2016-01-18 (×2): 240 mg via ORAL
  Filled 2016-01-17 (×3): qty 1

## 2016-01-17 MED ORDER — TRAMADOL HCL 50 MG PO TABS
50.0000 mg | ORAL_TABLET | Freq: Two times a day (BID) | ORAL | Status: DC | PRN
  Administered 2016-01-18 (×2): 50 mg via ORAL
  Filled 2016-01-17 (×2): qty 1

## 2016-01-17 MED ORDER — RAMELTEON 8 MG PO TABS
8.0000 mg | ORAL_TABLET | Freq: Every day | ORAL | Status: DC
  Administered 2016-01-18 (×2): 8 mg via ORAL
  Filled 2016-01-17 (×3): qty 1

## 2016-01-17 MED ORDER — DEXTROSE 50 % IV SOLN
50.0000 mL | Freq: Once | INTRAVENOUS | Status: AC
  Administered 2016-01-17: 50 mL via INTRAVENOUS
  Filled 2016-01-17: qty 50

## 2016-01-17 MED ORDER — CLONAZEPAM 0.5 MG PO TABS
1.0000 mg | ORAL_TABLET | Freq: Two times a day (BID) | ORAL | Status: DC | PRN
  Administered 2016-01-18 – 2016-01-19 (×2): 1 mg via ORAL
  Filled 2016-01-17 (×2): qty 2

## 2016-01-17 MED ORDER — ACETAMINOPHEN 650 MG RE SUPP: 650.0000 mg | Freq: Four times a day (QID) | RECTAL | Status: DC | PRN

## 2016-01-17 MED ORDER — ZOLMITRIPTAN 5 MG NA SOLN: 1.0000 | Freq: Once | NASAL | Status: DC | PRN

## 2016-01-17 MED ORDER — SODIUM CHLORIDE 0.9% FLUSH
3.0000 mL | Freq: Two times a day (BID) | INTRAVENOUS | Status: DC
  Administered 2016-01-18 – 2016-01-19 (×2): 3 mL via INTRAVENOUS

## 2016-01-17 MED ORDER — ONDANSETRON HCL 4 MG PO TABS: 4.0000 mg | ORAL_TABLET | Freq: Four times a day (QID) | ORAL | Status: DC | PRN

## 2016-01-17 MED ORDER — SODIUM CHLORIDE 0.9 % IV SOLN
INTRAVENOUS | Status: DC
  Administered 2016-01-17 – 2016-01-19 (×7): via INTRAVENOUS

## 2016-01-17 NOTE — ED Triage Notes (Signed)
Pt states she may have taken too much aspirin on Sunday.  Pt states she has headache and hearing loss and ringing in the ears.

## 2016-01-17 NOTE — H&P (Addendum)
History and Physical    Diane [name] [id number] DOB: Aug 03, 1970 DOA: 01/17/2016  Referring MD/NP/PA:  PCP: Beatrice Lecher, MD  Patient coming from: Transfer from [facility]  Chief Complaint: Aspirin overdose  HPI: Diane [name] is a 45 y.o. female with medical history significant of migraine headaches, hypothyroidism, HTN, lupus, bipolar disorder, pulmonary embolus on aspirin; who presents after reportedly taking an unintentional amount of aspirin possibly 2 days ago. Patient notes that she had been sleep and had woken up because she had a headache. She notes that she somehow took some unknown amount of 324 mg aspirin. She has a prescription for this for her previous pulmonary embolus and she was tired of taking warfarin and this was an alternative. Patient notes that she had moved the prescription for the 325 mg aspirin near her other medications because it was nearing expiration. The following day the patient's parents were present at bedside and had additional history noted that the patient was more lethargic and altered talking out of her head. Patient's memory of the events are somewhat unclear. She reports today however complaining of a significant headache in the posterior aspect of her head coming around to the left eye. Associated symptoms include loss of hearing both years. Denies any vomiting, abdominal pain, respiratory distress, pleasant stool/urine, dysuria, or suicidal ideations. Family notes that the patient has been more alert and back to her normal self today.  ED Course: Upon admission to the emergency department patient was evaluated and seen to be afebrile with heart rates up to 112, respirations up to 32, and other vitals within normal limits. Lab work revealed elevated salicylate level  [id number], potassium 2.8, and normal anion gap. Urine drug screen was negative. Poison Control contacted and recommended continued monitoring of anion gap and electrolytes. Patient was  given 3 L of normal saline IV fluids and 100 mEq potassium replacement prior to transport to telemetry bed at [facility] to [facility] service.  Review of Systems: As per HPI otherwise 10 point review of systems negative.   Past Medical History:  Diagnosis Date  . Anxiety   . Bipolar 1 disorder (HCC)    rapid cycler-- Dr Yehuda Budd  . Fibromyalgia   . GERD (gastroesophageal reflux disease)    GI Dr Percell Miller  . Hypertension   . Hypothyroidism    Dr Starleen Blue ([place])  . Interstitial cystitis   . Lupus ([place])   . Migraine   . Peptic ulcer disease   . Polycystic ovarian disease   . Pulmonary embolism ([place]) 01/25/11  . Water retention     Past Surgical History:  Procedure Laterality Date  . CHOLECYSTECTOMY  2004     reports that she has never smoked. She has never used smokeless tobacco. She reports that she drinks alcohol. She reports that she does not use drugs.  Allergies  Allergen Reactions  . Eletriptan Nausea And Vomiting  . Lisinopril Other (See Comments)    Cough   . Relpax [Eletriptan Hydrobromide] Nausea And Vomiting  . Topiramate Other (See Comments)    Hyperactivity    Family History  Problem Relation Age of Onset  . Hypertension Father   . Urolithiasis Father   . Diabetes Mother   . Anxiety disorder Other   . Depression Other     Prior to Admission medications   Medication Sig Start Date End Date Taking? Authorizing Provider  aspirin 325 MG tablet Take 325 mg by mouth daily.   Yes Historical Provider, MD  baclofen (LIORESAL)  10 MG tablet every three (3) days as needed.  10/04/15  Yes Historical Provider, MD  clonazePAM (KLONOPIN) 1 MG tablet TAKE 1 TABLET BY MOUTH TWICE DAILY AS NEEDED FOR ANXIETY 11/16/15  Yes Hali Marry, MD  esomeprazole (NEXIUM) 40 MG capsule Take by mouth. 10/04/15  Yes Historical Provider, MD  Gabapentin, Once-Daily, (GRALISE) 600 MG TABS Take by mouth. 10/24/15  Yes Historical Provider, MD  L-Methylfolate-Algae (DEPLIN 15) 15-90.314 MG  CAPS Take 1 capsule by mouth daily. 12/19/15  Yes Hali Marry, MD  levothyroxine (SYNTHROID, LEVOTHROID) 25 MCG tablet Take 37.5 mcg by mouth daily before breakfast.   Yes Historical Provider, MD  metFORMIN (GLUCOPHAGE-XR) 500 MG 24 hr tablet Take 500 mg by mouth daily with breakfast. 01/23/14  Yes Historical Provider, MD  Methylfol-Algae-B12-Acetylcyst (CEREFOLIN NAC) 6-90.314-2-600 MG TABS TAKE ONE TABLET BY MOUTH ONE TIME DAILY 12/20/15  Yes Hali Marry, MD  ondansetron (ZOFRAN ODT) 4 MG disintegrating tablet Take 1 tablet (4 mg total) by mouth every 8 (eight) hours as needed for nausea or vomiting. 09/13/15  Yes Hali Marry, MD  rizatriptan (MAXALT-MLT) 10 MG disintegrating tablet Take by mouth. 09/20/15 09/19/16 Yes Historical Provider, MD  ROZEREM 8 MG tablet TAKE 1 TABLET BY MOUTH EVERY NIGHT AT BEDTIME 01/05/16  Yes Hali Marry, MD  traMADol (ULTRAM) 50 MG tablet Take 1 tablet (50 mg total) by mouth every 12 (twelve) hours as needed. 05/12/15  Yes Hali Marry, MD  verapamil (VERELAN PM) 240 MG 24 hr capsule TAKE ONE CAPSULE BY MOUTH EVERY NIGHT AT BEDTIME 12/13/15  Yes Hali Marry, MD  ZOMIG 5 MG nasal solution 5 mg. 09/11/14  Yes Historical Provider, MD  TEGRETOL-XR 200 MG 12 hr tablet Take 3 tablets (600 mg total) by mouth at bedtime. 05/12/15   Hali Marry, MD    Physical Exam:   Constitutional: Obese female in NAD, calm, comfortable Vitals:   01/17/16 1930 01/17/16 1950 01/17/16 2000 01/17/16 2100  BP: 122/73 128/81 119/78 (!) 149/83  Pulse: 109 104 104 (!) 108  Resp: (!) 32 (!) 30  20  Temp:  98.9 F (37.2 C)  98.1 F (36.7 C)  TempSrc:  Oral  Oral  SpO2: 100% 100% 100% 100%  Weight:    106.2 kg (234 lb 1.6 oz)  Height:    5\' 3"  (1.6 m)   Eyes: PERRL, lids and conjunctivae normal ENMT: Mucous membranes are moist. Posterior pharynx clear of any exudate or lesions.Normal dentition.  Neck: normal, supple, no masses, no  thyromegaly Respiratory: clear to auscultation bilaterally, no wheezing, no crackles. Normal respiratory effort. No accessory muscle use.  Cardiovascular: Tachycardic, no murmurs / rubs / gallops. No extremity edema. 2+ pedal pulses. No carotid bruits.  Abdomen: no tenderness, no masses palpated. No hepatosplenomegaly. Bowel sounds positive.  Musculoskeletal: no clubbing / cyanosis. No joint deformity upper and lower extremities. Good ROM, no contractures. Normal muscle tone.  Skin: no rashes, lesions, ulcers. No induration Neurologic: CN 2-12 grossly intact. Sensation intact, DTR normal. Strength 5/5 in all 4.  Psychiatric: Normal judgment and insight. Alert and oriented x 3. Normal mood.     Labs on Admission: I have personally reviewed following labs and imaging studies  CBC:  Recent Labs Lab 01/17/16 1044  WBC 9.8  NEUTROABS 7.3  HGB 10.3*  HCT 32.3*  MCV 74.1*  PLT 123456   Basic Metabolic Panel:  Recent Labs Lab 01/17/16 1044 01/17/16 1430 01/17/16 1800  NA 137  138 137  K 2.8* 3.4* 3.0*  CL 109 112* 112*  CO2 21* 20* 18*  GLUCOSE 84 81 141*  BUN 9 8 7   CREATININE 1.03* 0.89 0.90  CALCIUM 8.3* 7.5* 7.7*  MG 2.1  --   --    GFR: Estimated Creatinine Clearance: 94 mL/min (by C-G formula based on SCr of 0.9 mg/dL). Liver Function Tests:  Recent Labs Lab 01/17/16 1044 01/17/16 1800  AST 16 18  ALT 16 14  ALKPHOS 91 81  BILITOT 0.2* 0.2*  PROT 7.7 7.0  ALBUMIN 3.3* 3.1*   No results for input(s): LIPASE, AMYLASE in the last 168 hours. No results for input(s): AMMONIA in the last 168 hours. Coagulation Profile: No results for input(s): INR, PROTIME in the last 168 hours. Cardiac Enzymes: No results for input(s): CKTOTAL, CKMB, CKMBINDEX, TROPONINI in the last 168 hours. BNP (last 3 results) No results for input(s): PROBNP in the last 8760 hours. HbA1C: No results for input(s): HGBA1C in the last 72 hours. CBG: No results for input(s): GLUCAP in the last  168 hours. Lipid Profile: No results for input(s): CHOL, HDL, LDLCALC, TRIG, CHOLHDL, LDLDIRECT in the last 72 hours. Thyroid Function Tests: No results for input(s): TSH, T4TOTAL, FREET4, T3FREE, THYROIDAB in the last 72 hours. Anemia Panel: No results for input(s): VITAMINB12, FOLATE, FERRITIN, TIBC, IRON, RETICCTPCT in the last 72 hours. Urine analysis:    Component Value Date/Time   COLORURINE YELLOW 01/17/2016 1225   APPEARANCEUR CLEAR 01/17/2016 1225   LABSPEC 1.008 01/17/2016 1225   PHURINE 5.5 01/17/2016 1225   GLUCOSEU 100 (A) 01/17/2016 1225   HGBUR NEGATIVE 01/17/2016 [place] 01/17/2016 1225   BILIRUBINUR negative 06/17/2012 1519   KETONESUR NEGATIVE 01/17/2016 1225   PROTEINUR NEGATIVE 01/17/2016 1225   UROBILINOGEN 0.2 01/10/2015 2158   NITRITE NEGATIVE 01/17/2016 1225   LEUKOCYTESUR NEGATIVE 01/17/2016 1225   Sepsis Labs: No results found for this or any previous visit (from the past 240 hour(s)).   Radiological Exams on Admission: Ct Head Wo Contrast  Result Date: 01/17/2016 CLINICAL DATA:  Headache and difficulty with hearing. EXAM: CT HEAD WITHOUT CONTRAST TECHNIQUE: Contiguous axial images were obtained from the base of the skull through the vertex without intravenous contrast. COMPARISON:  July 22, 2015 FINDINGS: Brain: The ventricles are normal in size and configuration. There is no intracranial mass, hemorrhage, extra-axial fluid collection, or midline shift. Gray-white compartments are normal. There is no apparent acute infarct. Vascular: There is no hyperdense vessel. There is a small amount of calcification in each carotid siphon region. Skull: The bony calvarium appears intact. Sinuses/Orbits: Visualized paranasal sinuses are clear. Orbits appear symmetric bilaterally. Other: Mastoid air cells are clear. IMPRESSION: Mild degree of calcification in each carotid siphon region. No intracranial mass, hemorrhage, or focal gray - white compartment  lesions/acute appearing infarct. Electronically Signed   By: Lowella Grip III M.D.   On: 01/17/2016 10:37    EKG: Independently reviewed. Sinus tachycardia 104 bpm  Assessment/Plan Salicylate overdose: Suspected to be unintentional. Patient accidentally took unknown amount of ASA 325. Denies any suicidal ideation or will to harm self. Patient developed reported hearing loss - Admit to a telemetry bed - Hold ASA - continue IVF NS - recheck BMP and salicylate level  Migraine headache - continue home regimen as tolerated   Sinus Tachycardia: Chronic. - Continue to monitor  Essential hypertension - continue verapamil  Hypokalemia: It appears initial potassium was 2.8 admission. She was given a total  of almost 100 mEq of potassium chloride prior to transport.  - continue to monitor   Bipolar disorder/anxiety - Continue clonazepam prn  Hypothyroidism - Check TSH - will need to restart levothyroxine  Diabetes mellitus type 2: Patient currently only on metformin. Last hemoglobin A1c on file from 03/2012 noted to be 6.0. - Hold metformin  - Carb modified diet  History of PE/ History of lupus anticoagulant disorder: Patient she is currently on high-dose aspirin did not tolerate Coumadin. - Hold aspirin secondary to above  GERD  - continue pharmacy substitution of Protonix for Nexium   DVT prophylaxis: lovenox   Code Status: Full Family Communication: Discussed care with patient and family present at bedside   Disposition Plan: Possible discharge home Consults called: None Admission status: Observation telemetry  Norval Morton MD [facility] Pager [phone]  If 7PM-7AM, please contact night-coverage [email or web address] Password [email or web address]  01/17/2016, 11:15 PM

## 2016-01-17 NOTE — ED Provider Notes (Signed)
[place] DEPT [facility] Provider Note   CSN: [id number] Arrival date & time: 01/17/16  [id number]     History   Chief Complaint Chief Complaint  Patient presents with  . Headache  . Drug Overdose    ASPIRIN    HPI Diane [name] is a 45 y.o. female hx bipolar, fibromyalgia, HTN, lupus, Chronic migraines here presenting with headache, ringing in her ears, possible aspirin overdose. She has worsening of her chronic migraines 2 days ago. She states that she was in pain so she took a handful of aspirin. She cannot tell me how much she took but she told me that it was 324 mg of aspirin pills. She was very tired yesterday and slept all day and denies taking any further aspirin. She adamantly denies trying to kill herself. She also denies any alcohol use or any Tylenol use.  She has ringing in her ears since yesterday. Has headaches as well. No vomiting, no abdominal pain or blood in stool.    The history is provided by the patient.    Past Medical History:  Diagnosis Date  . Anxiety   . Bipolar 1 disorder (HCC)    rapid cycler-- Dr Yehuda Budd  . Fibromyalgia   . GERD (gastroesophageal reflux disease)    GI Dr Percell Miller  . Hypertension   . Hypothyroidism    Dr Starleen Blue ([place])  . Interstitial cystitis   . Lupus ([place])   . Migraine   . Peptic ulcer disease   . Polycystic ovarian disease   . Pulmonary embolism ([place]) 01/25/11  . Water retention     Patient Active Problem List   Diagnosis Date Noted  . 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 Gastroesophageal reflux disease with esophagitis 05/04/2014  . Cervical disc disorder with radiculopathy of cervical region 04/06/2014  . Midline low back pain without sciatica 04/06/2014  . Boutonniere deformity 03/31/2014  . Headache disorder 03/03/2014  . Amnesia 03/03/2014  . Cervical pain 12/30/2013  . Fibromyalgia 10/08/2013  . 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 Backhand tennis elbow  05/21/2013  . Bursitis of shoulder 05/21/2013  . IBS (irritable bowel syndrome) 04/09/2013  . Adaptive colitis 04/09/2013  . Pain in the wrist 04/03/2013  . Arthritis, degenerative 02/27/2013  . Gonalgia 02/12/2013  . Synovitis of knee 02/12/2013  . Fatigue 01/22/2013  . 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 APL (antiphospholipid syndrome) ([place]) 12/26/2012  . ANA positive 12/23/2012  . 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 LA (lupus anticoagulant) disorder ([place]) 12/05/2012  . Acid reflux 02/19/2012  . Cannot sleep 02/19/2012  . Apnea, sleep 02/19/2012  . Palpitations 11/14/2011  . Migraine without aura 07/24/2011  . Bipolar 1 disorder ([place]) 07/24/2011  . Iron deficiency 02/12/2011  . Anemia, iron deficiency 02/12/2011  . Other pulmonary embolism and infarction 01/31/2011  . POLYCYSTIC OVARIAN DISEASE 06/23/2010  . Bipolar I disorder, single manic episode ([place]) 06/07/2010  . Headache, migraine 06/07/2010  . OBSTRUCTIVE SLEEP APNEA 02/03/2010  . Obesity 11/25/2009  . INTERSTITIAL CYSTITIS 11/25/2009  . Abnormal weight gain 09/29/2009  . Hypothyroidism 11/08/2008  . Anxiety state 11/08/2008  . DEPRESSION 11/08/2008  . Essential hypertension 11/08/2008  . DIVERTICULOSIS OF COLON 11/08/2008    Past Surgical History:  Procedure Laterality Date  . CHOLECYSTECTOMY  2004    OB History    Gravida Para Term Preterm AB Living   0 0  SAB TAB Ectopic Multiple Live Births                   Home Medications    Prior to Admission medications   Medication Sig Start Date End Date Taking? Authorizing Provider  aspirin 325 MG tablet Take 325 mg by mouth daily.   Yes Historical Provider, MD  baclofen (LIORESAL) 10 MG tablet every three (3) days as needed.  10/04/15  Yes Historical Provider, MD  clonazePAM (KLONOPIN) 1 MG tablet TAKE 1 TABLET BY MOUTH TWICE DAILY AS NEEDED FOR ANXIETY 11/16/15  Yes Hali Marry, MD  esomeprazole (NEXIUM) 40 MG  capsule Take by mouth. 10/04/15  Yes Historical Provider, MD  Gabapentin, Once-Daily, (GRALISE) 600 MG TABS Take by mouth. 10/24/15  Yes Historical Provider, MD  L-Methylfolate-Algae (DEPLIN 15) 15-90.314 MG CAPS Take 1 capsule by mouth daily. 12/19/15  Yes Hali Marry, MD  levothyroxine (SYNTHROID, LEVOTHROID) 25 MCG tablet Take 37.5 mcg by mouth daily before breakfast.   Yes Historical Provider, MD  metFORMIN (GLUCOPHAGE-XR) 500 MG 24 hr tablet Take 500 mg by mouth daily with breakfast. 01/23/14  Yes Historical Provider, MD  Methylfol-Algae-B12-Acetylcyst (CEREFOLIN NAC) 6-90.314-2-600 MG TABS TAKE ONE TABLET BY MOUTH ONE TIME DAILY 12/20/15  Yes Hali Marry, MD  ondansetron (ZOFRAN ODT) 4 MG disintegrating tablet Take 1 tablet (4 mg total) by mouth every 8 (eight) hours as needed for nausea or vomiting. 09/13/15  Yes Hali Marry, MD  rizatriptan (MAXALT-MLT) 10 MG disintegrating tablet Take by mouth. 09/20/15 09/19/16 Yes Historical Provider, MD  ROZEREM 8 MG tablet TAKE 1 TABLET BY MOUTH EVERY NIGHT AT BEDTIME 01/05/16  Yes Hali Marry, MD  traMADol (ULTRAM) 50 MG tablet Take 1 tablet (50 mg total) by mouth every 12 (twelve) hours as needed. 05/12/15  Yes Hali Marry, MD  verapamil (VERELAN PM) 240 MG 24 hr capsule TAKE ONE CAPSULE BY MOUTH EVERY NIGHT AT BEDTIME 12/13/15  Yes Hali Marry, MD  ZOMIG 5 MG nasal solution 5 mg. 09/11/14  Yes Historical Provider, MD  TEGRETOL-XR 200 MG 12 hr tablet Take 3 tablets (600 mg total) by mouth at bedtime. 05/12/15   Hali Marry, MD    Family History Family History  Problem Relation Age of Onset  . Hypertension Father   . Urolithiasis Father   . Diabetes Mother   . Anxiety disorder Other   . Depression Other     Social History Social History  Substance Use Topics  . Smoking status: Never Smoker  . Smokeless tobacco: Never Used  . Alcohol use Yes     Comment: rarely     Allergies     Eletriptan; Lisinopril; Relpax [eletriptan hydrobromide]; and Topiramate   Review of Systems Review of Systems  Neurological: Positive for dizziness and headaches.  All other systems reviewed and are negative.    Physical Exam Updated Vital Signs BP 121/77   Pulse 108   Temp 99.4 F (37.4 C) (Oral)   Resp 26   Ht 5\' 3"  (1.6 m)   Wt 229 lb (103.9 kg)   LMP 12/20/2015   SpO2 98%   BMI 40.57 kg/m   Physical Exam  Constitutional: She is oriented to person, place, and time. She appears well-developed and well-nourished.  HENT:  Head: Normocephalic.  Eyes: EOM are normal. Pupils are equal, round, and reactive to light.  No obvious nystagmus   Neck: Normal range of motion. Neck supple.  Cardiovascular: Normal rate,  regular rhythm and normal heart sounds.   Pulmonary/Chest: Effort normal and breath sounds normal. No respiratory distress. She has no wheezes. She has no rales.  Abdominal: Soft. Bowel sounds are normal. She exhibits no distension. There is no tenderness. There is no guarding.  Musculoskeletal: Normal range of motion.  Neurological: She is alert and oriented to person, place, and time. She has normal reflexes. No cranial nerve deficit. Coordination normal.  Skin: Skin is warm.  Psychiatric: She has a normal mood and affect.  Nursing note and vitals reviewed.    ED Treatments / Results  Labs (all labs ordered are listed, but only abnormal results are displayed) Labs Reviewed  CBC WITH DIFFERENTIAL/PLATELET - Abnormal; Notable for the following:       Result Value   Hemoglobin 10.3 (*)    HCT 32.3 (*)    MCV 74.1 (*)    MCH 23.6 (*)    RDW 16.4 (*)    All other components within normal limits  COMPREHENSIVE METABOLIC PANEL - Abnormal; Notable for the following:    Potassium 2.8 (*)    CO2 21 (*)    Creatinine, Ser 1.03 (*)    Calcium 8.3 (*)    Albumin 3.3 (*)    Total Bilirubin 0.2 (*)    All other components within normal limits  URINALYSIS,  ROUTINE W REFLEX MICROSCOPIC (NOT AT [facility]) - Abnormal; Notable for the following:    Glucose, UA 100 (*)    All other components within normal limits  SALICYLATE LEVEL - Abnormal; Notable for the following:    Salicylate Lvl [id number] (*)    All other components within normal limits  ACETAMINOPHEN LEVEL - Abnormal; Notable for the following:    Acetaminophen (Tylenol), Serum <10 (*)    All other components within normal limits  BASIC METABOLIC PANEL - Abnormal; Notable for the following:    Potassium 3.4 (*)    Chloride 112 (*)    CO2 20 (*)    Calcium 7.5 (*)    All other components within normal limits  I-STAT VENOUS BLOOD GAS, ED - Abnormal; Notable for the following:    pCO2, Ven 39.0 (*)    pO2, Ven 29.0 (*)    Acid-base deficit 4.0 (*)    All other components within normal limits  PREGNANCY, URINE  ETHANOL  URINE RAPID DRUG SCREEN, HOSP PERFORMED  MAGNESIUM  SALICYLATE LEVEL  BLOOD GAS, VENOUS  BASIC METABOLIC PANEL    EKG  EKG Interpretation  Date/Time:  Tuesday January 17 2016 10:45:40 EDT Ventricular Rate:  104 PR Interval:    QRS Duration: 93 QT Interval:  332 QTC Calculation: 437 R Axis:   -30 Text Interpretation:  Sinus tachycardia Consider left atrial enlargement Left axis deviation Borderline T abnormalities, anterior leads No previous ECGs available Confirmed by YAO  MD, DAVID ([id number]) on 01/17/2016 10:50:16 AM Also confirmed by Darl Householder  MD, DAVID ([id number]), editor BREWER, CCT, GLENN [id number])  on 01/17/2016 11:58:02 AM       Radiology Ct Head Wo Contrast  Result Date: 01/17/2016 CLINICAL DATA:  Headache and difficulty with hearing. EXAM: CT HEAD WITHOUT CONTRAST TECHNIQUE: Contiguous axial images were obtained from the base of the skull through the vertex without intravenous contrast. COMPARISON:  July 22, 2015 FINDINGS: Brain: The ventricles are normal in size and configuration. There is no intracranial mass, hemorrhage, extra-axial fluid collection, or midline  shift. Gray-white compartments are normal. There is no apparent acute infarct. Vascular: There is no hyperdense  vessel. There is a small amount of calcification in each carotid siphon region. Skull: The bony calvarium appears intact. Sinuses/Orbits: Visualized paranasal sinuses are clear. Orbits appear symmetric bilaterally. Other: Mastoid air cells are clear. IMPRESSION: Mild degree of calcification in each carotid siphon region. No intracranial mass, hemorrhage, or focal gray - white compartment lesions/acute appearing infarct. Electronically Signed   By: Lowella Grip III M.D.   On: 01/17/2016 10:37    Procedures Procedures (including critical care time)  CRITICAL CARE Performed by: Wandra Arthurs   Total critical care time:30 minutes  Critical care time was exclusive of separately billable procedures and treating other patients.  Critical care was necessary to treat or prevent imminent or life-threatening deterioration.  Critical care was time spent personally by me on the following activities: development of treatment plan with patient and/or surrogate as well as nursing, discussions with consultants, evaluation of patient's response to treatment, examination of patient, obtaining history from patient or surrogate, ordering and performing treatments and interventions, ordering and review of laboratory studies, ordering and review of radiographic studies, pulse oximetry and re-evaluation of patient's condition.     Medications Ordered in ED Medications  sodium chloride 0.9 % bolus 1,000 mL (0 mLs Intravenous Stopped 01/17/16 1159)  metoCLOPramide (REGLAN) injection 10 mg (10 mg Intravenous Given 01/17/16 1056)  diphenhydrAMINE (BENADRYL) injection 12.5 mg (12.5 mg Intravenous Given 01/17/16 1056)  sodium chloride 0.9 % bolus 1,000 mL (0 mLs Intravenous Stopped 01/17/16 1351)  potassium chloride SA (K-DUR,KLOR-CON) CR tablet 40 mEq (40 mEq Oral Given 01/17/16 1142)  potassium chloride 10  mEq in 100 mL IVPB (0 mEq Intravenous Stopped 01/17/16 1259)  dextrose 50 % solution 50 mL (50 mLs Intravenous Given 01/17/16 1155)  potassium chloride 10 mEq in 100 mL IVPB (0 mEq Intravenous Stopped 01/17/16 1430)  sodium chloride 0.9 % bolus 1,000 mL (1,000 mLs Intravenous New Bag/Given 01/17/16 1447)     Initial Impression / Assessment and Plan / ED Course  I have reviewed the triage vital signs and the nursing notes.  Pertinent labs & imaging results that were available during my care of the patient were reviewed by me and considered in my medical decision making (see chart for details).  Clinical Course    Diane [name] is a 45 y.o. female here with ringing in her ears, headaches, possible ASA overdose about 2 days ago. Denies suicidal ideation. Denies any tylenol overdose or alcohol use. Will consult poison control.   10 am  I called poison control. They recommend labs, CMP, tox. Even if salicylate level neg, will need 3 hr level. VBG showed nl pH.   11 am CMP came back and K 2.8 and Cr 1.0 (baseline 0.7). Salicylate [id number]. Called poison control again. Recommend replace K and add magnesium level. Need repeat BMP and salicylate in 3 hrs.   3:37 PM K supplemented and repeat was 3.4. Bicarb still 20. Repeat salicylate level was 22. Tachycardia improved. Still has mild tinnitus. Called poison control again. They want to give more IVF and repeat BMP given bicarb 20 in 3 hrs. Signed out to Dr. Regenia Skeeter in the ED to reassess and follow up BMP and call poison control again after labs.    Final Clinical Impressions(s) / ED Diagnoses   Final diagnoses:  None    New Prescriptions New Prescriptions   No medications on file     Drenda Freeze, MD 01/17/16 1537

## 2016-01-17 NOTE — ED Provider Notes (Signed)
7:19 PM Discussed BMP/salicylate results with Poison control. Given bicarb and K, recommend admission to hospital for normalization of these values. Supplement electrolytes and monitor. Still some ringing for patient. Consult hospitalist.  7:41 PM Dr. Tamala Julian to admit to tele obs   Sherwood Gambler, MD 01/17/16 [phone]

## 2016-01-17 NOTE — Progress Notes (Signed)
Discussed case with Dr. Regenia Skeeter Diane [name] is a 45 year old female with a past medical history chronic migraines; who presents after an unintentional overdose of salicylates. Initial salicylate level [id number] recommend continued monitoring of electrolytes and acid base status which is currently within normal limits. Suspect patient will need repeat BMP and salicylate level upon arrival. Admitting to a telemetry bed for further monitoring.

## 2016-01-18 DIAGNOSIS — E1169 Type 2 diabetes mellitus with other specified complication: Secondary | ICD-10-CM

## 2016-01-18 DIAGNOSIS — E119 Type 2 diabetes mellitus without complications: Secondary | ICD-10-CM | POA: Diagnosis not present

## 2016-01-18 DIAGNOSIS — T39091A Poisoning by salicylates, accidental (unintentional), initial encounter: Secondary | ICD-10-CM | POA: Diagnosis not present

## 2016-01-18 DIAGNOSIS — E876 Hypokalemia: Secondary | ICD-10-CM | POA: Diagnosis not present

## 2016-01-18 DIAGNOSIS — Z86711 Personal history of pulmonary embolism: Secondary | ICD-10-CM | POA: Diagnosis not present

## 2016-01-18 DIAGNOSIS — E282 Polycystic ovarian syndrome: Secondary | ICD-10-CM | POA: Diagnosis present

## 2016-01-18 DIAGNOSIS — G43109 Migraine with aura, not intractable, without status migrainosus: Secondary | ICD-10-CM | POA: Diagnosis not present

## 2016-01-18 DIAGNOSIS — E669 Obesity, unspecified: Secondary | ICD-10-CM

## 2016-01-18 DIAGNOSIS — E039 Hypothyroidism, unspecified: Secondary | ICD-10-CM | POA: Diagnosis not present

## 2016-01-18 DIAGNOSIS — I10 Essential (primary) hypertension: Secondary | ICD-10-CM | POA: Diagnosis not present

## 2016-01-18 LAB — BASIC METABOLIC PANEL
Anion gap: 8 (ref 5–15)
BUN: 5 mg/dL — AB (ref 6–20)
CALCIUM: 8.3 mg/dL — AB (ref 8.9–10.3)
CHLORIDE: 111 mmol/L (ref 101–111)
CO2: 19 mmol/L — AB (ref 22–32)
CREATININE: 0.82 mg/dL (ref 0.44–1.00)
GFR calc non Af Amer: >60 mL/min (ref >60)
GLUCOSE: 102 mg/dL — AB (ref 65–99)
Potassium: 3.5 mmol/L (ref 3.5–5.1)
Sodium: 138 mmol/L (ref 135–145)

## 2016-01-18 LAB — SALICYLATE LEVEL: SALICYLATE LVL: 16.7 mg/dL (ref 2.8–30.0)

## 2016-01-18 LAB — T4, FREE: FREE T4: 0.81 ng/dL (ref 0.61–1.12)

## 2016-01-18 LAB — TSH: TSH: 0.252 u[IU]/mL — ABNORMAL LOW (ref 0.350–4.500)

## 2016-01-18 LAB — GLUCOSE, CAPILLARY: GLUCOSE-CAPILLARY: 86 mg/dL (ref 65–99)

## 2016-01-18 MED ORDER — HEPARIN SODIUM (PORCINE) 5000 UNIT/ML IJ SOLN: 5000.0000 [IU] | Freq: Three times a day (TID) | INTRAMUSCULAR | Status: DC

## 2016-01-18 MED ORDER — INSULIN ASPART 100 UNIT/ML ~~LOC~~ SOLN: 0.0000 [IU] | Freq: Every day | SUBCUTANEOUS | Status: DC

## 2016-01-18 MED ORDER — INSULIN ASPART 100 UNIT/ML ~~LOC~~ SOLN: 0.0000 [IU] | Freq: Three times a day (TID) | SUBCUTANEOUS | Status: DC

## 2016-01-18 MED ORDER — GABAPENTIN (ONCE-DAILY) 600 MG PO TABS
600.0000 mg | ORAL_TABLET | Freq: Every day | ORAL | Status: DC
  Administered 2016-01-19: 600 mg via ORAL

## 2016-01-18 MED ORDER — GABAPENTIN 300 MG PO CAPS
300.0000 mg | ORAL_CAPSULE | Freq: Two times a day (BID) | ORAL | Status: AC
  Administered 2016-01-18 (×2): 300 mg via ORAL
  Filled 2016-01-18 (×2): qty 1

## 2016-01-18 MED ORDER — POTASSIUM CHLORIDE CRYS ER 20 MEQ PO TBCR
40.0000 meq | EXTENDED_RELEASE_TABLET | Freq: Once | ORAL | Status: AC
  Administered 2016-01-18: 40 meq via ORAL
  Filled 2016-01-18: qty 2

## 2016-01-18 MED ORDER — LEVOTHYROXINE SODIUM 75 MCG PO TABS
37.5000 ug | ORAL_TABLET | Freq: Every day | ORAL | Status: DC
  Administered 2016-01-18 – 2016-01-19 (×2): 37.5 ug via ORAL
  Filled 2016-01-18 (×2): qty 1

## 2016-01-18 MED ORDER — DEXTROSE 50 % IV SOLN: 25.0000 mL | INTRAVENOUS | Status: DC | PRN

## 2016-01-18 NOTE — Progress Notes (Signed)
Responded to spiritual Care consult to provide spiritual support and counsel to patient who needed prayer and guidance. Patient said she had not been able to sleep the last couple of days but still she felt okay.  Patient is ready to go home and excited about being discharged today.  Patient expressed  that her hospital visit has caused her to reevaluate many matters in her life that she feel will be helpful and healthy for her in the future.

## 2016-01-18 NOTE — Progress Notes (Addendum)
PROGRESS NOTE    Diane [name]  [id number] DOB: 06-Apr-1971 DOA: 01/17/2016 PCP: Beatrice Lecher, MD      Brief Narrative:  Diane [name] is a 45 y.o. female with medical history significant of migraine headaches, hypothyroidism, HTN, lupus, bipolar disorder, pulmonary embolus on aspirin; who presents after reportedly taking an unintentional amount of aspirin possibly 2 days ago. Patient notes that she had been sleep and had woken up because she had a headache. She notes that she somehow took some unknown amount of 324 mg aspirin. She has a prescription for this for her previous pulmonary embolus and she was tired of taking warfarin and this was an alternative. Patient notes that she had moved the prescription for the 325 mg aspirin near her other medications because it was nearing expiration. The following day the patient's parents were present at bedside and had additional history noted that the patient was more lethargic and altered talking out of her head. Patient's memory of the events are somewhat unclear. She reports today however complaining of a significant headache in the posterior aspect of her head coming around to the left eye. Associated symptoms include loss of hearing both years. Denies any vomiting, abdominal pain, respiratory distress, pleasant stool/urine, dysuria, or suicidal ideations. Family notes that the patient has been more alert and back to her normal self today.   Assessment & Plan:   Principal Problem:   Overdose of salicylate Active Problems:   Hypothyroidism   Essential hypertension   History of pulmonary embolus (PE)   Headache, migraine   Hypokalemia   Diabetes mellitus type 2 in obese (HCC)   Salicylate overdose -Suspected to be unintentional. Patient accidentally took unknown amount of ASA 325. Denies any suicidal ideation or will to harm self. Patient developed reported hearing loss - Hold ASA - Continue IVF NS - Salicylate level  trending down   Acute encephalopathy -Improved, likely secondary to polypharmacy and sleep deprivation   Migraine headache - continue home regimen as tolerated  Essential hypertension - continue verapamil  Hypokalemia -Resolved   Bipolar disorder/anxiety - Continue clonazepam prn  Hypothyroidism - Restart home levothyroxine   PCOS - Not diabetic  - Hold metformin   History of PE/ History of lupus anticoagulant disorder: Patient she is currently on high-dose aspirin did not tolerate Coumadin. - Hold aspirin secondary to above  GERD  - continue pharmacy substitution of Protonix for Nexium   DVT prophylaxis: subq hep    Code Status: Full Family Communication:  no family present at bedside during exam Disposition Plan:  hopeful discharge home tomorrow   Consultants:   None  Procedures:   None  Antimicrobials:   None    Subjective: Patient complaining of nausea and grogginess. She also has bilateral earaches. She states that prior to admission, she had been taking multiple medications for migraine including Maxalt, Rozerem and gabapentin and baclofen. She also takes Klonopin. She states that over the past week, she has been very stressed and having lack of sleep. She states that she woke up and was very confused and groggy. She laid out her pills for the day and made a cup of coffee. She believes that she took extra doses of aspirin by mistake. She has no intention of self-harm. She does not recall events of Monday.   Objective: Vitals:   01/18/16 0044 01/18/16 0423 01/18/16 0741 01/18/16 1140  BP: (!) 142/69 129/72 (!) 149/82 (!) 147/88  Pulse:  97 98 86  Resp:  20 20  20  Temp:   98.1 F (36.7 C) 98.3 F (36.8 C)  TempSrc:  Oral Oral Oral  SpO2:   98% 100%  Weight:  106.6 kg (235 lb)    Height:        Intake/Output Summary (Last 24 hours) at 01/18/16 1439 Last data filed at 01/18/16 1422  Gross per 24 hour  Intake           1922.5 ml  Output                 0 ml  Net           1922.5 ml   Filed Weights   01/17/16 0949 01/17/16 2100 01/18/16 0423  Weight: 103.9 kg (229 lb) 106.2 kg (234 lb 1.6 oz) 106.6 kg (235 lb)    Examination:  General exam: Appears calm and comfortable, lethargic  Respiratory system: Clear to auscultation. Respiratory effort normal. Cardiovascular system: S1 & S2 heard, RRR. No JVD, murmurs, rubs, gallops or clicks. No pedal edema. Gastrointestinal system: Abdomen is nondistended, soft and nontender. No organomegaly or masses felt. Normal bowel sounds heard. Central nervous system: Alert and oriented. No focal neurological deficits. Extremities: Symmetric 5 x 5 power. Skin: No rashes, lesions or ulcers Psychiatry: Judgement and insight appear normal. Mood & affect appropriate.   Data Reviewed: I have personally reviewed following labs and imaging studies  CBC:  Recent Labs Lab 01/17/16 1044  WBC 9.8  NEUTROABS 7.3  HGB 10.3*  HCT 32.3*  MCV 74.1*  PLT 123456   Basic Metabolic Panel:  Recent Labs Lab 01/17/16 1044 01/17/16 1430 01/17/16 1800 01/18/16 0023  NA 137 138 137 138  K 2.8* 3.4* 3.0* 3.5  CL 109 112* 112* 111  CO2 21* 20* 18* 19*  GLUCOSE 84 81 141* 102*  BUN 9 8 7  5*  CREATININE 1.03* 0.89 0.90 0.82  CALCIUM 8.3* 7.5* 7.7* 8.3*  MG 2.1  --   --   --    GFR: Estimated Creatinine Clearance: 103.5 mL/min (by C-G formula based on SCr of 0.82 mg/dL). Liver Function Tests:  Recent Labs Lab 01/17/16 1044 01/17/16 1800  AST 16 18  ALT 16 14  ALKPHOS 91 81  BILITOT 0.2* 0.2*  PROT 7.7 7.0  ALBUMIN 3.3* 3.1*   No results for input(s): LIPASE, AMYLASE in the last 168 hours. No results for input(s): AMMONIA in the last 168 hours. Coagulation Profile: No results for input(s): INR, PROTIME in the last 168 hours. Cardiac Enzymes: No results for input(s): CKTOTAL, CKMB, CKMBINDEX, TROPONINI in the last 168 hours. BNP (last 3 results) No results for input(s): PROBNP in the  last 8760 hours. HbA1C: No results for input(s): HGBA1C in the last 72 hours. CBG: No results for input(s): GLUCAP in the last 168 hours. Lipid Profile: No results for input(s): CHOL, HDL, LDLCALC, TRIG, CHOLHDL, LDLDIRECT in the last 72 hours. Thyroid Function Tests:  Recent Labs  01/18/16 0023  TSH 0.252*  FREET4 0.81   Anemia Panel: No results for input(s): VITAMINB12, FOLATE, FERRITIN, TIBC, IRON, RETICCTPCT in the last 72 hours. Sepsis Labs: No results for input(s): PROCALCITON, LATICACIDVEN in the last 168 hours.  No results found for this or any previous visit (from the past 240 hour(s)).       Radiology Studies: Ct Head Wo Contrast  Result Date: 01/17/2016 CLINICAL DATA:  Headache and difficulty with hearing. EXAM: CT HEAD WITHOUT CONTRAST TECHNIQUE: Contiguous axial images were obtained from the base of the  skull through the vertex without intravenous contrast. COMPARISON:  July 22, 2015 FINDINGS: Brain: The ventricles are normal in size and configuration. There is no intracranial mass, hemorrhage, extra-axial fluid collection, or midline shift. Gray-white compartments are normal. There is no apparent acute infarct. Vascular: There is no hyperdense vessel. There is a small amount of calcification in each carotid siphon region. Skull: The bony calvarium appears intact. Sinuses/Orbits: Visualized paranasal sinuses are clear. Orbits appear symmetric bilaterally. Other: Mastoid air cells are clear. IMPRESSION: Mild degree of calcification in each carotid siphon region. No intracranial mass, hemorrhage, or focal gray - white compartment lesions/acute appearing infarct. Electronically Signed   By: Lowella Grip III M.D.   On: 01/17/2016 10:37        Scheduled Meds: . carbamazepine  600 mg Oral QHS  . enoxaparin (LOVENOX) injection  40 mg Subcutaneous Q24H  . Gabapentin (Once-Daily)  600 mg Oral Daily  . insulin aspart  0-9 Units Subcutaneous TID WC  . levothyroxine   37.5 mcg Oral QAC breakfast  . pantoprazole  40 mg Oral BID  . ramelteon  8 mg Oral QHS  . sodium chloride flush  3 mL Intravenous Q12H  . verapamil  240 mg Oral QHS   Continuous Infusions: . sodium chloride 150 mL/hr at 01/18/16 1130     LOS: 0 days    Time spent: 40 minutes    Dessa Phi, DO [facility] Pager [phone]  If 7PM-7AM, please contact night-coverage [email or web address] Password [facility] 01/18/2016, 2:39 PM

## 2016-01-18 NOTE — Progress Notes (Signed)
Pt c/o nausea, weakness, and a migraine, tramadol and zofran given, tol well.

## 2016-01-18 NOTE — Progress Notes (Signed)
Pt refused bed alarm on. Will continue hourly rounding. 

## 2016-01-19 DIAGNOSIS — E282 Polycystic ovarian syndrome: Secondary | ICD-10-CM

## 2016-01-19 DIAGNOSIS — Z86711 Personal history of pulmonary embolism: Secondary | ICD-10-CM | POA: Diagnosis not present

## 2016-01-19 DIAGNOSIS — I10 Essential (primary) hypertension: Secondary | ICD-10-CM | POA: Diagnosis not present

## 2016-01-19 DIAGNOSIS — E876 Hypokalemia: Secondary | ICD-10-CM | POA: Diagnosis not present

## 2016-01-19 DIAGNOSIS — G43109 Migraine with aura, not intractable, without status migrainosus: Secondary | ICD-10-CM | POA: Diagnosis not present

## 2016-01-19 DIAGNOSIS — E039 Hypothyroidism, unspecified: Secondary | ICD-10-CM | POA: Diagnosis not present

## 2016-01-19 DIAGNOSIS — T39091A Poisoning by salicylates, accidental (unintentional), initial encounter: Secondary | ICD-10-CM | POA: Diagnosis not present

## 2016-01-19 LAB — BASIC METABOLIC PANEL
ANION GAP: 4 — AB (ref 5–15)
BUN: <5 mg/dL — ABNORMAL LOW (ref 6–20)
CALCIUM: 8.5 mg/dL — AB (ref 8.9–10.3)
CO2: 21 mmol/L — ABNORMAL LOW (ref 22–32)
Chloride: 113 mmol/L — ABNORMAL HIGH (ref 101–111)
Creatinine, Ser: 0.79 mg/dL (ref 0.44–1.00)
Glucose, Bld: 95 mg/dL (ref 65–99)
Potassium: 4.3 mmol/L (ref 3.5–5.1)
SODIUM: 138 mmol/L (ref 135–145)

## 2016-01-19 LAB — GLUCOSE, CAPILLARY
GLUCOSE-CAPILLARY: 150 mg/dL — AB (ref 65–99)
GLUCOSE-CAPILLARY: 106 mg/dL — AB (ref 65–99)

## 2016-01-19 MED ORDER — GABAPENTIN 300 MG PO CAPS
300.0000 mg | ORAL_CAPSULE | Freq: Two times a day (BID) | ORAL | Status: DC
  Administered 2016-01-19: 300 mg via ORAL
  Filled 2016-01-19: qty 1

## 2016-01-19 NOTE — Discharge Summary (Signed)
Physician Discharge Summary  Diane [name] [id number] DOB: 11-Nov-1970 DOA: 01/17/2016  PCP: Beatrice Lecher, MD  Admit date: 01/17/2016 Discharge date: 01/19/2016  Admitted From: home Disposition:  home  Recommendations for Outpatient Follow-up:  1. Follow up with PCP in 1-2 weeks 2. Please obtain BMP in one week   Home Health: no  Equipment/Devices: none   Discharge Condition: stable CODE STATUS: full  Diet recommendation: regular, low fat   Brief/Interim Summary: Diane N Reid-Parkeris a 45 y.o.femalewith medical history significant of migraine headaches, hypothyroidism, HTN, lupus, bipolar disorder, pulmonary embolus on aspirin; who presents after reportedly taking an unintentional amount of aspirin possibly 2 days ago. Patient notes that she had been sleep and had woken up because she had a headache. She notes that she somehow took some unknown amount of 324 mg aspirin. She admits to some grogginess that day and disorientation as well. She has a prescription for aspirin for her previous pulmonary embolus and she was tired of taking warfarin and this was an alternative. Patient notes that she had moved the prescription for the 325 mg aspirin near her other medications because it was nearing expiration. The following day the patient's parents were present at bedside and had additional history noted that the patient was more lethargic and altered talking out of her head. Patient's memory of the events are somewhat unclear. She reports today however complaining of a significant headache in the posterior aspect of her head coming around to the left eye. Associated symptoms include loss of hearing both ears. Denies any vomiting, abdominal pain, respiratory distress, pleasant stool/urine, dysuria, or suicidal ideations.Family notes that the patient has been more alert and back to her normal self today. She denies intentional overdose or self harm. During admission, her salicylate  level peaked and trended down. Potassium was replaced and kidney levels remained normal. Her symptoms continued to improve and she was discharged in stable condition. She is encouraged to buy a pill box and keep close track of all medications. She should only take 1 aspirin tablet daily.   Discharge Diagnoses:  Principal Problem:   Overdose of salicylate Active Problems:   Hypothyroidism   Essential hypertension   History of pulmonary embolus (PE)   Headache, migraine   Hypokalemia   PCOS (polycystic ovarian syndrome)  Salicylate overdose -Suspected to be unintentional. Patient accidentally took unknown amount of ASA 325. Denies any suicidal ideation or will to harm self. Patient developed reported hearing loss - Hold ASA. Educated patient on importance of taking only 1 tablet daily.  - Continue IVF NS - Salicylate level trended down   Acute encephalopathy -Improved, likely secondary to polypharmacy and sleep deprivation   Migraine headache - continue home regimen as tolerated  Essential hypertension - continue verapamil  Hypokalemia -Resolved with repletion   Metabolic acidosis -Likely due to salicylate toxicity -Should repeat BMP in 1 week after discharge   Bipolar disorder/anxiety - Continue clonazepam prn  Hypothyroidism - Restart home levothyroxine   PCOS - Not diabetic  - Hold metformin   History of PE/History of lupus anticoagulant disorder:Patient she is currently on high-dose aspirin did not tolerate Coumadin. - Hold aspirin secondary to above  GERD  - continue pharmacy substitution of Protonix for Nexium   Discharge Instructions  Discharge Instructions    Diet general    Complete by:  As directed    Increase activity slowly    Complete by:  As directed        Medication List  TAKE these medications   aspirin 325 MG tablet Take 325 mg by mouth daily.   baclofen 10 MG tablet Commonly known as:  LIORESAL Take 10 mg by mouth  every three (3) days as needed for muscle spasms.   CEREFOLIN NAC 6-90.314-2-600 MG Tabs TAKE ONE TABLET BY MOUTH ONE TIME DAILY   clonazePAM 1 MG tablet Commonly known as:  KLONOPIN TAKE 1 TABLET BY MOUTH TWICE DAILY AS NEEDED FOR ANXIETY   DEPLIN 15 15-90.314 MG Caps Take 1 capsule by mouth daily.   esomeprazole 40 MG capsule Commonly known as:  NEXIUM Take 40 mg by mouth daily.   GRALISE 600 MG Tabs Generic drug:  Gabapentin (Once-Daily) Take 600 mg by mouth daily.   levothyroxine 25 MCG tablet Commonly known as:  SYNTHROID, LEVOTHROID Take 37.5 mcg by mouth daily before breakfast.   MAXALT-MLT 10 MG disintegrating tablet Generic drug:  rizatriptan Take 10 mg by mouth as needed for migraine.   metFORMIN 500 MG 24 hr tablet Commonly known as:  GLUCOPHAGE-XR Take 500 mg by mouth 2 (two) times daily.   ondansetron 4 MG disintegrating tablet Commonly known as:  ZOFRAN ODT Take 1 tablet (4 mg total) by mouth every 8 (eight) hours as needed for nausea or vomiting.   ROZEREM 8 MG tablet Generic drug:  ramelteon TAKE 1 TABLET BY MOUTH EVERY NIGHT AT BEDTIME   verapamil 240 MG 24 hr capsule Commonly known as:  VERELAN PM TAKE ONE CAPSULE BY MOUTH EVERY NIGHT AT BEDTIME   ZOMIG 5 MG nasal solution Generic drug:  zolmitriptan 5 mg.      Follow-up Information    METHENEY,CATHERINE, MD. Schedule an appointment as soon as possible for a visit in 1 week(s).   Specialty:  Family Medicine Contact information: [address] [place] [address] [place] [address] [phone]          Allergies  Allergen Reactions  . Eletriptan Nausea And Vomiting  . Lisinopril Other (See Comments)    Cough   . Relpax [Eletriptan Hydrobromide] Nausea And Vomiting  . Topiramate Other (See Comments)    Hyperactivity    Consultations:  None    Procedures/Studies: Ct Head Wo Contrast  Result Date: 01/17/2016 CLINICAL DATA:  Headache and difficulty with hearing. EXAM: CT  HEAD WITHOUT CONTRAST TECHNIQUE: Contiguous axial images were obtained from the base of the skull through the vertex without intravenous contrast. COMPARISON:  July 22, 2015 FINDINGS: Brain: The ventricles are normal in size and configuration. There is no intracranial mass, hemorrhage, extra-axial fluid collection, or midline shift. Gray-white compartments are normal. There is no apparent acute infarct. Vascular: There is no hyperdense vessel. There is a small amount of calcification in each carotid siphon region. Skull: The bony calvarium appears intact. Sinuses/Orbits: Visualized paranasal sinuses are clear. Orbits appear symmetric bilaterally. Other: Mastoid air cells are clear. IMPRESSION: Mild degree of calcification in each carotid siphon region. No intracranial mass, hemorrhage, or focal gray - white compartment lesions/acute appearing infarct. Electronically Signed   By: Lowella Grip III M.D.   On: 01/17/2016 10:37     Subjective: Doing better this morning. No complaints.  Discharge Exam: Vitals:   01/18/16 2338 01/19/16 0446  BP: (!) 158/98 (!) 153/98  Pulse: 85 91  Resp: 19 19  Temp: 98 F (36.7 C) 98.5 F (36.9 C)   Vitals:   01/18/16 1415 01/18/16 2112 01/18/16 2338 01/19/16 0446  BP: (!) 151/91 (!) 151/93 (!) 158/98 (!) 153/98  Pulse:  88 85 85 91  Resp: 18 19 19 19   Temp: 98.5 F (36.9 C) 97.8 F (36.6 C) 98 F (36.7 C) 98.5 F (36.9 C)  TempSrc: Oral Oral Oral Oral  SpO2: 99% 98% 98% 98%  Weight:    107.2 kg (236 lb 4.8 oz)  Height:        General: Pt is alert, awake, not in acute distress Cardiovascular: RRR, S1/S2 +, no rubs, no gallops Respiratory: CTA bilaterally, no wheezing, no rhonchi Abdominal: Soft, NT, ND, bowel sounds + Extremities: no edema, no cyanosis   The results of significant diagnostics from this hospitalization (including imaging, microbiology, ancillary and laboratory) are listed below for reference.     Microbiology: No results  found for this or any previous visit (from the past 240 hour(s)).   Labs: BNP (last 3 results) No results for input(s): BNP in the last 8760 hours. Basic Metabolic Panel:  Recent Labs Lab 01/17/16 1044 01/17/16 1430 01/17/16 1800 01/18/16 0023 01/19/16 0416  NA 137 138 137 138 138  K 2.8* 3.4* 3.0* 3.5 4.3  CL 109 112* 112* 111 113*  CO2 21* 20* 18* 19* 21*  GLUCOSE 84 81 141* 102* 95  BUN 9 8 7  5* <5*  CREATININE 1.03* 0.89 0.90 0.82 0.79  CALCIUM 8.3* 7.5* 7.7* 8.3* 8.5*  MG 2.1  --   --   --   --    Liver Function Tests:  Recent Labs Lab 01/17/16 1044 01/17/16 1800  AST 16 18  ALT 16 14  ALKPHOS 91 81  BILITOT 0.2* 0.2*  PROT 7.7 7.0  ALBUMIN 3.3* 3.1*   No results for input(s): LIPASE, AMYLASE in the last 168 hours. No results for input(s): AMMONIA in the last 168 hours. CBC:  Recent Labs Lab 01/17/16 1044  WBC 9.8  NEUTROABS 7.3  HGB 10.3*  HCT 32.3*  MCV 74.1*  PLT 346   Cardiac Enzymes: No results for input(s): CKTOTAL, CKMB, CKMBINDEX, TROPONINI in the last 168 hours. BNP: Invalid input(s): POCBNP CBG:  Recent Labs Lab 01/18/16 2119 01/19/16 0706  GLUCAP 86 150*   D-Dimer No results for input(s): DDIMER in the last 72 hours. Hgb A1c No results for input(s): HGBA1C in the last 72 hours. Lipid Profile No results for input(s): CHOL, HDL, LDLCALC, TRIG, CHOLHDL, LDLDIRECT in the last 72 hours. Thyroid function studies  Recent Labs  01/18/16 0023  TSH 0.252*   Anemia work up No results for input(s): VITAMINB12, FOLATE, FERRITIN, TIBC, IRON, RETICCTPCT in the last 72 hours. Urinalysis    Component Value Date/Time   COLORURINE YELLOW 01/17/2016 1225   APPEARANCEUR CLEAR 01/17/2016 1225   LABSPEC 1.008 01/17/2016 1225   PHURINE 5.5 01/17/2016 1225   GLUCOSEU 100 (A) 01/17/2016 1225   HGBUR NEGATIVE 01/17/2016 [place] 01/17/2016 1225   BILIRUBINUR negative 06/17/2012 1519   KETONESUR NEGATIVE 01/17/2016 1225    PROTEINUR NEGATIVE 01/17/2016 1225   UROBILINOGEN 0.2 01/10/2015 2158   NITRITE NEGATIVE 01/17/2016 1225   LEUKOCYTESUR NEGATIVE 01/17/2016 1225   Sepsis Labs Invalid input(s): PROCALCITONIN,  WBC,  LACTICIDVEN Microbiology No results found for this or any previous visit (from the past 240 hour(s)).   Time coordinating discharge: Over 30 minutes  SIGNED:  Dessa Phi, DO [facility] Pager [phone]  If 7PM-7AM, please contact night-coverage [email or web address] Password [facility] 01/19/2016, 10:15 AM

## 2016-01-19 NOTE — Progress Notes (Signed)
Patient is alert and oriented, vital signs are stable, iv removed, discharge instructions reviewed, no prescriptions, patient to follow up with primary MD Neta Mends RN 4:36 PM 01-19-2016

## 2016-01-23 ENCOUNTER — Telehealth: Payer: Self-pay | Admitting: Family Medicine

## 2016-01-23 ENCOUNTER — Ambulatory Visit: Payer: 59 | Admitting: Family Medicine

## 2016-01-23 DIAGNOSIS — E876 Hypokalemia: Secondary | ICD-10-CM

## 2016-01-23 NOTE — Telephone Encounter (Signed)
Order placed.Diane [name]  

## 2016-01-23 NOTE — Telephone Encounter (Signed)
Patient called request to know if she can get a lab order  To have her potassium checked at a [facility] lab in [place] which closes at [place] will send a phone note. Thanks

## 2016-01-26 ENCOUNTER — Inpatient Hospital Stay: Payer: Medicare Other | Admitting: Family Medicine

## 2016-01-27 ENCOUNTER — Inpatient Hospital Stay: Payer: 59 | Admitting: Family Medicine

## 2016-01-27 NOTE — Progress Notes (Deleted)
Subjective:    CC:   HPI:  45 year old female who comes in today for hospital follow-up. She was recently admitted to the hospital after she unintentionally took extra doses of aspirin over 2 days. She recently started on a new psychiatric medication which was extremely sedating. She workup with a headache and took an unknown amount of aspirin. The patient's actually found her the following data and noted that she was lethargic and not talking clearly. She felt like she had a loss of hearing but no GI symptoms. She denied any intention to harm herself. They did monitor her salicylate levels and watch them trend down before she was discharged from the hospital. She was hypokalemic and kidneys are stable. She was encouraged to buy a pillbox to keep better track of her medications and should only take one aspirin per day.  Past medical history, Surgical history, Family history not pertinant except as noted below, Social history, Allergies, and medications have been entered into the medical record, reviewed, and corrections made.   Review of Systems: No fevers, chills, night sweats, weight loss, chest pain, or shortness of breath.   Objective:    General: Well Developed, well nourished, and in no acute distress.  Neuro: Alert and oriented x3, extra-ocular muscles intact, sensation grossly intact.  HEENT: Normocephalic, atraumatic  Skin: Warm and dry, no rashes. Cardiac: Regular rate and rhythm, no murmurs rubs or gallops, no lower extremity edema.  Respiratory: Clear to auscultation bilaterally. Not using accessory muscles, speaking in full sentences.   Impression and Recommendations:

## 2016-01-31 ENCOUNTER — Encounter: Payer: Self-pay | Admitting: Family Medicine

## 2016-01-31 ENCOUNTER — Ambulatory Visit (INDEPENDENT_AMBULATORY_CARE_PROVIDER_SITE_OTHER): Payer: Medicare Other | Admitting: Family Medicine

## 2016-01-31 VITALS — BP 148/85 | HR 93 | Ht 63.0 in | Wt 230.0 lb

## 2016-01-31 DIAGNOSIS — M542 Cervicalgia: Secondary | ICD-10-CM

## 2016-01-31 DIAGNOSIS — R7301 Impaired fasting glucose: Secondary | ICD-10-CM

## 2016-01-31 DIAGNOSIS — N179 Acute kidney failure, unspecified: Secondary | ICD-10-CM

## 2016-01-31 DIAGNOSIS — E876 Hypokalemia: Secondary | ICD-10-CM | POA: Diagnosis not present

## 2016-01-31 DIAGNOSIS — E282 Polycystic ovarian syndrome: Secondary | ICD-10-CM | POA: Diagnosis not present

## 2016-01-31 DIAGNOSIS — F319 Bipolar disorder, unspecified: Secondary | ICD-10-CM

## 2016-01-31 LAB — POCT GLYCOSYLATED HEMOGLOBIN (HGB A1C): HEMOGLOBIN A1C: 6
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
## 2016-02-01 LAB — BASIC METABOLIC PANEL WITH GFR
BUN: 7 mg/dL (ref 7–25)
CHLORIDE: 102 mmol/L (ref 98–110)
CO2: 22 mmol/L (ref 20–31)
CREATININE: 0.88 mg/dL (ref 0.50–1.10)
Calcium: 8.9 mg/dL (ref 8.6–10.2)
GFR, Est African American: >89 mL/min (ref >=60)
GFR, Est Non African American: 81 mL/min (ref >=60)
Glucose, Bld: 111 mg/dL — ABNORMAL HIGH (ref 65–99)
Potassium: 3.9 mmol/L (ref 3.5–5.3)
Sodium: 136 mmol/L (ref 135–146)

## 2016-02-01 NOTE — Progress Notes (Signed)
All labs are normal. 

## 2016-02-03 DIAGNOSIS — F319 Bipolar disorder, unspecified: Secondary | ICD-10-CM | POA: Diagnosis not present

## 2016-02-06 ENCOUNTER — Encounter: Payer: Self-pay | Admitting: Family Medicine

## 2016-02-15 ENCOUNTER — Other Ambulatory Visit: Payer: Self-pay | Admitting: Family Medicine

## 2016-02-15 ENCOUNTER — Other Ambulatory Visit: Payer: Self-pay | Admitting: Physician Assistant

## 2016-02-15 DIAGNOSIS — F411 Generalized anxiety disorder: Secondary | ICD-10-CM

## 2016-02-16 NOTE — Telephone Encounter (Signed)
Refill was sent on 12/08/2015 w/6 refills

## 2016-03-02 DIAGNOSIS — F319 Bipolar disorder, unspecified: Secondary | ICD-10-CM | POA: Diagnosis not present

## 2016-03-19 DIAGNOSIS — L818 Other specified disorders of pigmentation: Secondary | ICD-10-CM | POA: Diagnosis not present

## 2016-03-19 DIAGNOSIS — L308 Other specified dermatitis: Secondary | ICD-10-CM | POA: Diagnosis not present

## 2016-03-20 ENCOUNTER — Encounter: Payer: Self-pay | Admitting: Genetic Counselor

## 2016-03-20 ENCOUNTER — Telehealth: Payer: Self-pay | Admitting: Genetic Counselor

## 2016-03-20 NOTE — Telephone Encounter (Signed)
Patient called and LM on my VM.  I called back and have left her a message.  We have done this twice.  I have left a second VM with my CB information.

## 2016-03-20 NOTE — Telephone Encounter (Signed)
Patient called to let me know htat her uncle passed in August and now her aunt was recently dx with breast cancer.  Took a family history and there are 3 family members with breast cancer and two with colon cancer on the maternal side of the family.  All diagnosed above 60. Discussed that while she technically meets criteria for genetic testing, since all individuals are over 31, the cancer that is running in the family is most likely familial and not hereditary.  Offered her an appointment.  Discussed testing someone who ha cancer would be more informative such as her aunt.  Patient feels that her aunt would not be interested in testing due to religous beliefs.  We could offer testing to the patient's mother as she is more closely related to the affected individuals.  Patient thought that her mother may be more interested in testing now that her sister has been diagnosed.  She will talk with her mother and call me back.

## 2016-04-05 DIAGNOSIS — F319 Bipolar disorder, unspecified: Secondary | ICD-10-CM | POA: Diagnosis not present

## 2016-04-09 DIAGNOSIS — G43709 Chronic migraine without aura, not intractable, without status migrainosus: Secondary | ICD-10-CM | POA: Diagnosis not present

## 2016-04-30 DIAGNOSIS — F319 Bipolar disorder, unspecified: Secondary | ICD-10-CM | POA: Diagnosis not present

## 2016-05-03 ENCOUNTER — Ambulatory Visit: Payer: Medicare Other | Admitting: Family Medicine

## 2016-05-11 DIAGNOSIS — F319 Bipolar disorder, unspecified: Secondary | ICD-10-CM | POA: Diagnosis not present

## 2016-05-19 ENCOUNTER — Other Ambulatory Visit: Payer: Self-pay | Admitting: Family Medicine

## 2016-05-31 ENCOUNTER — Ambulatory Visit: Payer: Managed Care, Other (non HMO) | Admitting: Family Medicine

## 2016-06-07 DIAGNOSIS — F319 Bipolar disorder, unspecified: Secondary | ICD-10-CM | POA: Diagnosis not present

## 2016-06-11 DIAGNOSIS — K118 Other diseases of salivary glands: Secondary | ICD-10-CM | POA: Diagnosis not present

## 2016-06-12 LAB — CBC AND DIFFERENTIAL
HCT: 36 % (ref 36–46)
Hemoglobin: 11.4 g/dL — AB (ref 12.0–16.0)
Neutrophils Absolute: 5 /uL
Platelets: 394 10*3/uL (ref 150–399)
WBC: 9 10^3/mL

## 2016-06-12 LAB — BASIC METABOLIC PANEL
BUN: 10 mg/dL (ref 4–21)
CREATININE: 1 mg/dL (ref 0.5–1.1)
GLUCOSE: 87 mg/dL
POTASSIUM: 4.3 mmol/L (ref 3.4–5.3)
SODIUM: 141 mmol/L (ref 137–147)

## 2016-06-12 LAB — HEMOGLOBIN A1C: Hemoglobin A1C: 5.7

## 2016-06-12 LAB — HEPATIC FUNCTION PANEL
ALT: 12 U/L (ref 7–35)
AST: 15 U/L (ref 13–35)
Alkaline Phosphatase: 95 U/L (ref 25–125)
Bilirubin, Total: 0.2 mg/dL

## 2016-06-12 LAB — TSH: TSH: 0.7 u[IU]/mL (ref 0.41–5.90)

## 2016-06-12 LAB — VITAMIN D 25 HYDROXY (VIT D DEFICIENCY, FRACTURES): Vit D, 25-Hydroxy: 39.4

## 2016-06-13 DIAGNOSIS — E2839 Other primary ovarian failure: Secondary | ICD-10-CM | POA: Diagnosis not present

## 2016-06-15 ENCOUNTER — Encounter: Payer: Self-pay | Admitting: Family Medicine

## 2016-06-15 ENCOUNTER — Ambulatory Visit (INDEPENDENT_AMBULATORY_CARE_PROVIDER_SITE_OTHER): Payer: Medicare Other | Admitting: Family Medicine

## 2016-06-15 VITALS — BP 143/93 | HR 109 | Wt 217.0 lb

## 2016-06-15 DIAGNOSIS — E282 Polycystic ovarian syndrome: Secondary | ICD-10-CM | POA: Diagnosis not present

## 2016-06-15 DIAGNOSIS — R7989 Other specified abnormal findings of blood chemistry: Secondary | ICD-10-CM

## 2016-06-15 DIAGNOSIS — F411 Generalized anxiety disorder: Secondary | ICD-10-CM

## 2016-06-15 DIAGNOSIS — E7800 Pure hypercholesterolemia, unspecified: Secondary | ICD-10-CM | POA: Diagnosis not present

## 2016-06-15 DIAGNOSIS — R7301 Impaired fasting glucose: Secondary | ICD-10-CM | POA: Diagnosis not present

## 2016-06-15 DIAGNOSIS — Z6838 Body mass index (BMI) 38.0-38.9, adult: Secondary | ICD-10-CM | POA: Diagnosis not present

## 2016-06-15 DIAGNOSIS — N939 Abnormal uterine and vaginal bleeding, unspecified: Secondary | ICD-10-CM | POA: Diagnosis not present

## 2016-06-15 DIAGNOSIS — F319 Bipolar disorder, unspecified: Secondary | ICD-10-CM | POA: Diagnosis not present

## 2016-06-15 DIAGNOSIS — I10 Essential (primary) hypertension: Secondary | ICD-10-CM | POA: Diagnosis not present

## 2016-06-15 DIAGNOSIS — N926 Irregular menstruation, unspecified: Secondary | ICD-10-CM

## 2016-06-15 LAB — POCT GLYCOSYLATED HEMOGLOBIN (HGB A1C): Hemoglobin A1C: 5.7

## 2016-06-15 MED ORDER — RAMELTEON 8 MG PO TABS: 8.0000 mg | ORAL_TABLET | Freq: Every day | ORAL | 0 refills | Status: DC

## 2016-06-15 MED ORDER — VERAPAMIL HCL ER 240 MG PO CP24: 240.0000 mg | ORAL_CAPSULE | Freq: Two times a day (BID) | ORAL | 1 refills | Status: DC

## 2016-06-15 MED ORDER — CLONAZEPAM 1 MG PO TABS: 1.0000 mg | ORAL_TABLET | Freq: Two times a day (BID) | ORAL | 0 refills | Status: DC | PRN

## 2016-06-15 NOTE — Progress Notes (Addendum)
Subjective:    CC: HTN  HPI:  Hypertension- Pt denies chest pain, SOB, dizziness, or heart palpitations.  Taking meds as directed w/o problems.  Denies medication side effects.  She just been in a lot of pain lately with her headaches.  She's been having chronic daily headaches. She is Artie on gabapentin for this. She's been on Topamax in the past and says it usually works well for a short period time and seems to wear off. She's noticed that if she takes a low-dose of amitriptyline at bedtime will help her sleep which helps as well. She is Artie on verapamil really more for mood.  IFG/PCOS - She is doing really well overall. She has recently started a coconut oil and ketogenic diet to try to get more healthy. Lab Results  Component Value Date   HGBA1C 6.0 01/31/2016   She has had some weight loss recently partly because she has felt more down. She is really not wanting to eat regularly. She's also had a stone stuck in her stomach and her salivary duct and actually scabby having a procedure soon to have it removed. This is affecting her ability to eat as well.  She also recently went to integrative therapies at [facility] in [place] and had some blood work including a CBC, CMP, hemoglobin A1c, thyroid panel, C-reactive protein, vitamin D. She would like to review those results today.   Past medical history, Surgical history, Family history not pertinant except as noted below, Social history, Allergies, and medications have been entered into the medical record, reviewed, and corrections made.   Review of Systems: No fevers, chills, night sweats, weight loss, chest pain, or shortness of breath.   Objective:    General: Well Developed, well nourished, and in no acute distress.  Neuro: Alert and oriented x3, extra-ocular muscles intact, sensation grossly intact.  HEENT: Normocephalic, atraumatic  Skin: Warm and dry, no rashes. Cardiac: Regular rate and rhythm, no murmurs rubs or  gallops, no lower extremity edema.  Respiratory: Clear to auscultation bilaterally. Not using accessory muscles, speaking in full sentences.   Impression and Recommendations:    HTN  - Uncontrolled.  Increase verapamil today. Normally I would just add a second agent but we are also trying to control her migraines better and something to try the maximum dose on the verapamil before adding a second agent. IFG - Hemoglobin A1c looks absolutely fantastic at 5.7. Continue current regimen. Repeat in 6 months.  PCOS - continue metformin. A1c looks great at 5.7.  Low iron-did encourage her to get back on her over-the-counter iron.  Hyperlipidemia-she is due to repeat her lipid panel in May. Her CRP was mildly elevated today so explained that that does indicate a potential increased risk for heart disease so controlling things like blood pressure cholesterol, diet exercise and healthy weight are extremely important.  Elevated creatinine-her creatinine on blood work was 1.02. Back in October was 0.8 some not sure why has jumped up. Recommend that we retest in the near months.  Migraine headaches-discussed increasing verapamil to 480 which is the maximum dose we could certainly try it and see if this helps with her migraines as well as help lower her blood pressure which is high today. Continue the Gralise.   Abnormal bleeding. She would really like to have her INR and PT and PTT checked today. She is Artie had a hypercoagulable panel workup done 3 years ago.  Obesity/BMI 38-she is in absolutely fantastic with weight loss though  I am concerned that it's really because a decreased appetite and not truly because she is trying to lose weight through diet and exercise.  Bipolar 1 D/O - she does plan on getting established with Dr. Letta Moynahan. Her appointment is in about a week or 2 and so she is requesting a refill on her Rozerem and her clonazepam today. Refills sent for 30 day supply to local  pharmacy.  Time spent 45 minutes, greater than 50% time spent counseling about hypertension, hyperlipidemia, elevated creatinine, migraine headaches, pain, abnormal bleeding, PCO S, and reviewing her labs with her today.

## 2016-06-18 ENCOUNTER — Encounter: Payer: Self-pay | Admitting: Family Medicine

## 2016-06-22 ENCOUNTER — Telehealth: Payer: Self-pay | Admitting: Family Medicine

## 2016-06-22 NOTE — Telephone Encounter (Signed)
Pt called and lvm stating that the verapamil dose is too strong. even though it is helping her head she reports that she "feels off". She would like something to be sent.Diane [name]

## 2016-06-22 NOTE — Telephone Encounter (Signed)
Pt declined flu shot °

## 2016-06-24 MED ORDER — VERAPAMIL HCL ER 120 MG PO CP24: 120.0000 mg | ORAL_CAPSULE | Freq: Two times a day (BID) | ORAL | 0 refills | Status: DC

## 2016-06-24 NOTE — Telephone Encounter (Signed)
OK, I will reduce dose to 120mg . This is half of what she is currently taking.

## 2016-06-25 DIAGNOSIS — F319 Bipolar disorder, unspecified: Secondary | ICD-10-CM | POA: Diagnosis not present

## 2016-06-25 NOTE — Telephone Encounter (Signed)
Attempted to contact Pt, went straight to VM. VM is full, unable to leave message.

## 2016-06-27 ENCOUNTER — Telehealth: Payer: Self-pay | Admitting: *Deleted

## 2016-06-27 ENCOUNTER — Other Ambulatory Visit: Payer: Self-pay | Admitting: Family Medicine

## 2016-06-27 NOTE — Telephone Encounter (Signed)
Pt informed.Diane [name]  

## 2016-06-27 NOTE — Telephone Encounter (Signed)
Pt lvm stating that she is going to be having surgery and wanted to know if it would be ok for her to d/c her aspirin 325 mg for 3 days. She stated that she had already spoken with her rheumatologist and they advised her to contact her pcp. She did not indicate what type of surgery she was having.Diane [name]

## 2016-06-27 NOTE — Telephone Encounter (Signed)
Yes, ok to stop ASA for 3 days.

## 2016-06-27 NOTE — Telephone Encounter (Signed)
spoke w/pt and she is having the surgery on 3/15 she stated they found some polyps. I told her that it should be ok however, I would fwd to Dr. Madilyn Fireman for advice.Diane [name] [place]

## 2016-07-11 ENCOUNTER — Ambulatory Visit: Payer: Medicare Other | Admitting: Family Medicine

## 2016-07-13 ENCOUNTER — Ambulatory Visit (INDEPENDENT_AMBULATORY_CARE_PROVIDER_SITE_OTHER): Payer: Managed Care, Other (non HMO) | Admitting: Sports Medicine

## 2016-07-13 ENCOUNTER — Encounter: Payer: Medicare Other | Admitting: Sports Medicine

## 2016-07-13 ENCOUNTER — Ambulatory Visit (INDEPENDENT_AMBULATORY_CARE_PROVIDER_SITE_OTHER): Payer: Managed Care, Other (non HMO)

## 2016-07-13 DIAGNOSIS — IMO0001 Reserved for inherently not codable concepts without codable children: Secondary | ICD-10-CM

## 2016-07-13 DIAGNOSIS — G8929 Other chronic pain: Secondary | ICD-10-CM | POA: Diagnosis not present

## 2016-07-13 DIAGNOSIS — M25561 Pain in right knee: Secondary | ICD-10-CM | POA: Insufficient documentation

## 2016-07-13 DIAGNOSIS — M25562 Pain in left knee: Secondary | ICD-10-CM | POA: Diagnosis not present

## 2016-07-13 DIAGNOSIS — M545 Low back pain: Secondary | ICD-10-CM

## 2016-07-13 DIAGNOSIS — M1712 Unilateral primary osteoarthritis, left knee: Secondary | ICD-10-CM | POA: Diagnosis not present

## 2016-07-13 DIAGNOSIS — E6609 Other obesity due to excess calories: Secondary | ICD-10-CM

## 2016-07-13 MED ORDER — MELOXICAM 15 MG PO TABS: ORAL_TABLET | ORAL | 3 refills | Status: DC

## 2016-07-13 NOTE — Progress Notes (Signed)
Subjective:    I'm seeing this patient as a consultation for:  Dr. Beatrice Lecher  CC: Left knee pain, back pain  HPI: This is a 46 year old female, for years she's had pain in her left knee, medial aspect, as well as under the kneecap. Approximately 5-6 years ago she had some x-rays that showed early osteoarthritis. Right now she is on prednisone for possible lupus, and her knees are feeling okay. She does tell me it swelled up earlier. No mechanical symptoms, pain is moderate, persistent.  Back pain: Tells me she has a history of L4-L5 degenerative disc disease, pain is worse with sitting, flexion, Valsalva without a radicular component. Bowel or bladder dysfunction, saddle numbness, constitutional symptoms.  Obesity: Tells me she's trying multiple measures to lose weight, looking back she is approximately the same weight that she was several years ago.  Past medical history:  Negative.  See flowsheet/record as well for more information.  Surgical history: Negative.  See flowsheet/record as well for more information.  Family history: Negative.  See flowsheet/record as well for more information.  Social history: Negative.  See flowsheet/record as well for more information.  Allergies, and medications have been entered into the medical record, reviewed, and no changes needed.   Review of Systems: No headache, visual changes, nausea, vomiting, diarrhea, constipation, dizziness, abdominal pain, skin rash, fevers, chills, night sweats, weight loss, swollen lymph nodes, body aches, joint swelling, muscle aches, chest pain, shortness of breath, mood changes, visual or auditory hallucinations.   Objective:   General: Well Developed, well nourished, and in no acute distress.  Neuro/Psych: Alert and oriented x3, extra-ocular muscles intact, able to move all 4 extremities, sensation grossly intact. Skin: Warm and dry, no rashes noted.  Respiratory: Not using accessory muscles, speaking in  full sentences, trachea midline.  Cardiovascular: Pulses palpable, no extremity edema. Abdomen: Does not appear distended. Back Exam:  Inspection: Unremarkable  Motion: Flexion 45 deg, Extension 45 deg, Side Bending to 45 deg bilaterally,  Rotation to 45 deg bilaterally  SLR laying: Negative  XSLR laying: Negative  Palpable tenderness: None. FABER: negative. Sensory change: Gross sensation intact to all lumbar and sacral dermatomes.  Reflexes: 2+ at both patellar tendons, 2+ at achilles tendons, Babinski's downgoing.  Strength at foot  Plantar-flexion: 5/5 Dorsi-flexion: 5/5 Eversion: 5/5 Inversion: 5/5  Leg strength  Quad: 5/5 Hamstring: 5/5 Hip flexor: 5/5 Hip abductors: 5/5  Gait unremarkable. Left Knee: Normal to inspection with no erythema or effusion or obvious bony abnormalities. Markedly tenderness over the medial joint line ROM normal in flexion and extension and lower leg rotation. Ligaments with solid consistent endpoints including ACL, PCL, LCL, MCL. Negative Mcmurray's and provocative meniscal tests. Non painful patellar compression. Patellar and quadriceps tendons unremarkable. Hamstring and quadriceps strength is normal.  Impression and Recommendations:   This case required medical decision making of moderate complexity.  Primary osteoarthritis of left knee Likely primary osteoarthritis, when she finishes her prednisone she can start meloxicam, rehabilitation exercises given. I am going to get an updated set of x-rays. Return in one month, injection if no better. We have also discussed weight loss.  Obesity Patient will work on aggressive dieting for now. I have asked her to lose between 5 and 10 pounds over the next month, if she is unable to do it then whatever diet she is doing is not working and we need to consider something else such as pharmacologic intervention.  Midline low back pain without sciatica Lumbar degenerative disc  disease, updated x-rays,  rehabilitation exercises given. Return in one month, MRI for interventional planning if no better.

## 2016-07-13 NOTE — Assessment & Plan Note (Signed)
Likely primary osteoarthritis, when she finishes her prednisone she can start meloxicam, rehabilitation exercises given. I am going to get an updated set of x-rays. Return in one month, injection if no better. We have also discussed weight loss.

## 2016-07-13 NOTE — Assessment & Plan Note (Signed)
Patient will work on aggressive dieting for now. I have asked her to lose between 5 and 10 pounds over the next month, if she is unable to do it then whatever diet she is doing is not working and we need to consider something else such as pharmacologic intervention.

## 2016-07-13 NOTE — Assessment & Plan Note (Signed)
Lumbar degenerative disc disease, updated x-rays, rehabilitation exercises given. Return in one month, MRI for interventional planning if no better.

## 2016-07-24 ENCOUNTER — Telehealth: Payer: Self-pay

## 2016-07-24 DIAGNOSIS — M25561 Pain in right knee: Principal | ICD-10-CM

## 2016-07-24 DIAGNOSIS — G8929 Other chronic pain: Secondary | ICD-10-CM

## 2016-07-24 DIAGNOSIS — M1712 Unilateral primary osteoarthritis, left knee: Secondary | ICD-10-CM

## 2016-07-24 NOTE — Telephone Encounter (Signed)
Pt left VM stating she is in agony and feels like there is something else wrong in her knees besides arthritis. Please advise.

## 2016-07-24 NOTE — Telephone Encounter (Signed)
Pt states she felt something "snap/pop" in right knee on Sunday and has been holding the wall to walk. Pt states she had surgery on Thursday, pt was crying during call and would like the MRI ordered at [facility] so she can get it done ASAP. Pt is also in need of a note for school if possible.

## 2016-07-24 NOTE — Telephone Encounter (Signed)
X-ray show degenerative changes through the entire knee, if she is hurting significantly she should come in ASAP and we will do an injection which will relieve the pain rapidly, I'm also happy to go ahead and order an MRI of the knee to assess if we are missing anything.

## 2016-07-24 NOTE — Telephone Encounter (Signed)
What surgery specifically?

## 2016-07-25 NOTE — Telephone Encounter (Signed)
Left VM asking to call back with information.

## 2016-07-25 NOTE — Telephone Encounter (Signed)
Pt states she had uterine polyps removed.

## 2016-07-26 ENCOUNTER — Telehealth: Payer: Self-pay

## 2016-07-26 ENCOUNTER — Other Ambulatory Visit: Payer: Self-pay | Admitting: Family Medicine

## 2016-07-26 ENCOUNTER — Other Ambulatory Visit: Payer: Self-pay

## 2016-07-26 MED ORDER — BUPROPION HCL ER (SR) 100 MG PO TB12: 100.0000 mg | ORAL_TABLET | Freq: Every day | ORAL | 0 refills | Status: DC

## 2016-07-26 NOTE — Telephone Encounter (Signed)
MRI ordered, she should be reminded that the MRI will not treat her pain and that she should probably come in for an injection.

## 2016-07-26 NOTE — Telephone Encounter (Signed)
Ok to fill Wellbutrin for 30 days. but I don't even have the [place] on her list unless I am overlooking it.

## 2016-07-26 NOTE — Telephone Encounter (Signed)
Diane [name] was very upset about the rexulti.  She said it was prescribed by Thayer Headings NP on 01/06/16.  Do you want me to look into this further with her pharmacy?  Please advise.

## 2016-07-26 NOTE — Telephone Encounter (Signed)
Pt is requesting a refill on wellbutrin and rexulti until she is able to get in with someone.  Please advise.

## 2016-07-26 NOTE — Telephone Encounter (Signed)
I didn't see it either.  I wasn't sure if I had looked in all the right places.  I will let her know about the wellbutrin.  Thanks.

## 2016-07-26 NOTE — Telephone Encounter (Signed)
Yes, lets call for records from the NP.

## 2016-07-27 ENCOUNTER — Other Ambulatory Visit: Payer: Self-pay | Admitting: *Deleted

## 2016-07-27 ENCOUNTER — Other Ambulatory Visit: Payer: Self-pay | Admitting: Family Medicine

## 2016-07-27 ENCOUNTER — Ambulatory Visit: Payer: Medicare Other | Admitting: Sports Medicine

## 2016-07-27 ENCOUNTER — Telehealth: Payer: Self-pay | Admitting: Sports Medicine

## 2016-07-27 MED ORDER — BUPROPION HCL ER (XL) 150 MG PO TB24: 150.0000 mg | ORAL_TABLET | Freq: Every day | ORAL | 0 refills | Status: DC

## 2016-07-27 NOTE — Telephone Encounter (Signed)
It is wellbutrin XL 150 Mg, 1QD. Would you like me to send it?

## 2016-07-27 NOTE — Addendum Note (Signed)
Addended by: Silverio Decamp on: 07/27/2016 03:23 PM   Modules accepted: Orders

## 2016-07-27 NOTE — Telephone Encounter (Signed)
Left message for records.

## 2016-07-27 NOTE — Telephone Encounter (Signed)
Sent to pharmacy per verbal order from Dr. Madilyn Fireman.

## 2016-07-27 NOTE — Telephone Encounter (Signed)
Pt is requested MRI's of both knees.

## 2016-07-27 NOTE — Telephone Encounter (Signed)
Pre Authorization sent to cover my meds. [id number]

## 2016-07-27 NOTE — Telephone Encounter (Signed)
Sigh...  MRI also ordered for right knee.

## 2016-07-27 NOTE — Telephone Encounter (Signed)
Also, Pt had an appointment for this afternoon to get knees evaluated she cancelled this due to a headache. I am waiting on Pt to get me a copy of her new insurance card so I can authorize MRI ordered.

## 2016-07-27 NOTE — Telephone Encounter (Signed)
Wellbutrin has been approved from 07/22/2016-07/27/2017. Message left on pharm vm. Patient notified

## 2016-07-27 NOTE — Telephone Encounter (Signed)
Can she be contacted about the situation about her insurance, she is not understanding why she needs to bring in insurance in order to have MRI. But will do it in order to get it done, she just wants reasoning

## 2016-07-27 NOTE — Telephone Encounter (Signed)
Per NP office, records release form must be signed. Left this information on Pt's VM.

## 2016-07-27 NOTE — Telephone Encounter (Signed)
Spoke with Pt, clarified all questions regarding why card is required.

## 2016-07-30 NOTE — Telephone Encounter (Signed)
Patient request 90 day supply

## 2016-07-31 DIAGNOSIS — M25462 Effusion, left knee: Secondary | ICD-10-CM | POA: Diagnosis not present

## 2016-07-31 DIAGNOSIS — G43709 Chronic migraine without aura, not intractable, without status migrainosus: Secondary | ICD-10-CM | POA: Diagnosis not present

## 2016-07-31 DIAGNOSIS — M948X6 Other specified disorders of cartilage, lower leg: Secondary | ICD-10-CM | POA: Diagnosis not present

## 2016-08-01 ENCOUNTER — Telehealth: Payer: Self-pay | Admitting: Sports Medicine

## 2016-08-01 NOTE — Telephone Encounter (Signed)
Contact patient at this temporary # [phone] if she needs to be contacted about MRI that was done.

## 2016-08-02 ENCOUNTER — Ambulatory Visit (INDEPENDENT_AMBULATORY_CARE_PROVIDER_SITE_OTHER): Payer: Managed Care, Other (non HMO) | Admitting: Sports Medicine

## 2016-08-02 ENCOUNTER — Encounter: Payer: Self-pay | Admitting: Sports Medicine

## 2016-08-02 DIAGNOSIS — M25562 Pain in left knee: Secondary | ICD-10-CM | POA: Diagnosis not present

## 2016-08-02 DIAGNOSIS — M25561 Pain in right knee: Secondary | ICD-10-CM | POA: Diagnosis not present

## 2016-08-02 DIAGNOSIS — G8929 Other chronic pain: Secondary | ICD-10-CM | POA: Diagnosis not present

## 2016-08-02 NOTE — Progress Notes (Signed)
  Subjective:    CC: Follow-up  HPI: This is a pleasant 46 year old female, she returns for follow-up of her knee pain, MRIs showed focal chondral defects in the femoral condyle, she had no mechanical symptoms, and significant synovitis and effusion so we offered injection. Today she is agreeable to proceed.  Past medical history:  Negative.  See flowsheet/record as well for more information.  Surgical history: Negative.  See flowsheet/record as well for more information.  Family history: Negative.  See flowsheet/record as well for more information.  Social history: Negative.  See flowsheet/record as well for more information.  Allergies, and medications have been entered into the medical record, reviewed, and no changes needed.   Review of Systems: No fevers, chills, night sweats, weight loss, chest pain, or shortness of breath.   Objective:    General: Well Developed, well nourished, and in no acute distress.  Neuro: Alert and oriented x3, extra-ocular muscles intact, sensation grossly intact.  HEENT: Normocephalic, atraumatic, pupils equal round reactive to light, neck supple, no masses, no lymphadenopathy, thyroid nonpalpable.  Skin: Warm and dry, no rashes. Cardiac: Regular rate and rhythm, no murmurs rubs or gallops, no lower extremity edema.  Respiratory: Clear to auscultation bilaterally. Not using accessory muscles, speaking in full sentences.  Procedure: Real-time Ultrasound Guided Injection of left knee Device: GE Logiq E  Verbal informed consent obtained.  Time-out conducted.  Noted no overlying erythema, induration, or other signs of local infection.  Skin prepped in a sterile fashion.  Local anesthesia: Topical Ethyl chloride.  With sterile technique and under real time ultrasound guidance:  1 mL kenalog 40, 2 mL lidocaine, 2 mL bupivacaine injected easily Completed without difficulty  Pain immediately resolved suggesting accurate placement of the medication.  Advised  to call if fevers/chills, erythema, induration, drainage, or persistent bleeding.  Images permanently stored and available for review in the ultrasound unit.  Impression: Technically successful ultrasound guided injection.  Procedure: Real-time Ultrasound Guided Injection of right knee Device: GE Logiq E  Verbal informed consent obtained.  Time-out conducted.  Noted no overlying erythema, induration, or other signs of local infection.  Skin prepped in a sterile fashion.  Local anesthesia: Topical Ethyl chloride.  With sterile technique and under real time ultrasound guidance:  1 mL Kenalog 40, 2 mL lidocaine, 2 mL bupivacaine injected easily after aspiration of 20 mL straw-colored fluid. Completed without difficulty  Pain immediately resolved suggesting accurate placement of the medication.  Advised to call if fevers/chills, erythema, induration, drainage, or persistent bleeding.  Images permanently stored and available for review in the ultrasound unit.  Impression: Technically successful ultrasound guided injection.  Impression and Recommendations:    Bilateral knee pain There are a couple of full-thickness chondral defects on the weightbearing femoral condyle. This is consistent with a chondral defect/early osteoarthritis. Injections as above, return to see me in one month. If persistent pain she will be a candidate for microfracture/drilling.

## 2016-08-02 NOTE — Assessment & Plan Note (Signed)
There are a couple of full-thickness chondral defects on the weightbearing femoral condyle. This is consistent with a chondral defect/early osteoarthritis. Injections as above, return to see me in one month. If persistent pain she will be a candidate for microfracture/drilling.

## 2016-08-03 ENCOUNTER — Telehealth: Payer: Self-pay | Admitting: Sports Medicine

## 2016-08-03 NOTE — Telephone Encounter (Signed)
Please advise 

## 2016-08-03 NOTE — Telephone Encounter (Signed)
She has taken ib profen, duexis, and tramadol. She is still suffering from pain.

## 2016-08-03 NOTE — Telephone Encounter (Signed)
Incorrect # in chart.

## 2016-08-03 NOTE — Telephone Encounter (Signed)
Hasn't even been 24 hours since the injection, give it several days. For now apply ice/frozen pack of peas for 20 minutes 3-4 times a day.

## 2016-08-10 ENCOUNTER — Ambulatory Visit: Payer: Medicare Other | Admitting: Sports Medicine

## 2016-08-14 ENCOUNTER — Other Ambulatory Visit: Payer: Self-pay | Admitting: Family Medicine

## 2016-08-14 NOTE — Telephone Encounter (Signed)
Was able to locate and fax form. Pt returned clinic call, phone number updated and she was advised of current status.

## 2016-08-14 NOTE — Telephone Encounter (Signed)
Pt called to see if PCP had received records from [place] yet regarding Rexulti Rx. Pt states she signed a medical release form at last OV with Dr. Dianah Field.  Looked for release form, not scanned in chart and not waiting to be completed. Attempted to contact Pt to advise she will need to come in to sign a new form, no correct phone number in chart. The temporary number listed in chart from MRI message also did not work.

## 2016-08-16 ENCOUNTER — Other Ambulatory Visit: Payer: Self-pay | Admitting: Family Medicine

## 2016-08-20 ENCOUNTER — Telehealth: Payer: Self-pay | Admitting: Family Medicine

## 2016-08-20 MED ORDER — BREXPIPRAZOLE 0.25 MG PO TABS: 0.1250 | ORAL_TABLET | ORAL | 0 refills | Status: DC

## 2016-08-20 NOTE — Telephone Encounter (Signed)
Please call pt . Sent rx for Rexulti to her pharmacy it looks like she was taking 1/2 tab Q3 nights.  Also please see if she has found a new psych as well and when the appt is scheduled.

## 2016-08-21 ENCOUNTER — Telehealth: Payer: Self-pay

## 2016-08-21 DIAGNOSIS — M170 Bilateral primary osteoarthritis of knee: Secondary | ICD-10-CM

## 2016-08-21 NOTE — Telephone Encounter (Signed)
Continue taking the meloxicam, if that's not working I will add more tramadol. We also need to get her into physical therapy if she is not doing it already. A knee giving out is a knee that is weak. Next step is [place] supplementation.

## 2016-08-21 NOTE — Telephone Encounter (Signed)
Husband of pt left VM stating pt left knee gave out on her and now she's unable to walk and would like to know if there's anything she can take for her pain. Please advise.

## 2016-08-21 NOTE — Telephone Encounter (Signed)
Left VM advising of new Rx and requested callback with info on new Psych.

## 2016-08-22 ENCOUNTER — Telehealth: Payer: Self-pay

## 2016-08-22 DIAGNOSIS — M958 Other specified acquired deformities of musculoskeletal system: Secondary | ICD-10-CM

## 2016-08-22 MED ORDER — HYDROCODONE-ACETAMINOPHEN 5-325 MG PO TABS: 1.0000 | ORAL_TABLET | Freq: Three times a day (TID) | ORAL | 0 refills | Status: DC | PRN

## 2016-08-22 NOTE — Telephone Encounter (Signed)
Pt is taking Gralise 1800 mg a night.  3 each evening.  I let her know about the surgical treatment as the next step, and she is requesting something for pain.  Please advise.

## 2016-08-22 NOTE — Telephone Encounter (Signed)
The other option is to increase her dose of Gralise to 2 tabs daily instead of 1.  If that doesn't work then surgical treatment is the next step.

## 2016-08-22 NOTE — Telephone Encounter (Signed)
Left VM with information and call back in case of questions.

## 2016-08-22 NOTE — Telephone Encounter (Signed)
Pt stated that meloxicam and tramadol has not worked for her in the past.  Please advise  She is looking for a PT in her area, and will let us know.

## 2016-08-22 NOTE — Telephone Encounter (Signed)
Left VM with information.  

## 2016-08-22 NOTE — Telephone Encounter (Signed)
#  20 pills of hydrocodone given, referral placed to orthopedic surgery.

## 2016-08-29 ENCOUNTER — Other Ambulatory Visit: Payer: Self-pay | Admitting: *Deleted

## 2016-08-29 ENCOUNTER — Other Ambulatory Visit: Payer: Self-pay | Admitting: Family Medicine

## 2016-08-29 ENCOUNTER — Telehealth: Payer: Self-pay | Admitting: Family Medicine

## 2016-08-29 DIAGNOSIS — F411 Generalized anxiety disorder: Secondary | ICD-10-CM

## 2016-08-29 MED ORDER — BUPROPION HCL ER (XL) 150 MG PO TB24: 150.0000 mg | ORAL_TABLET | Freq: Every day | ORAL | 1 refills | Status: DC

## 2016-08-29 NOTE — Telephone Encounter (Signed)
Perfect

## 2016-08-29 NOTE — Telephone Encounter (Signed)
Pt called to let Provider know she has been scheduled with a Phych for the first week in June.

## 2016-09-03 ENCOUNTER — Other Ambulatory Visit: Payer: Self-pay | Admitting: Family Medicine

## 2016-09-04 ENCOUNTER — Ambulatory Visit: Payer: Medicare Other | Admitting: Sports Medicine

## 2016-09-11 ENCOUNTER — Ambulatory Visit: Payer: Managed Care, Other (non HMO) | Admitting: Rehabilitation

## 2016-09-13 ENCOUNTER — Ambulatory Visit (INDEPENDENT_AMBULATORY_CARE_PROVIDER_SITE_OTHER): Payer: Managed Care, Other (non HMO) | Admitting: Family Medicine

## 2016-09-13 ENCOUNTER — Encounter: Payer: Self-pay | Admitting: Family Medicine

## 2016-09-13 ENCOUNTER — Other Ambulatory Visit: Payer: Self-pay | Admitting: Family Medicine

## 2016-09-13 VITALS — BP 123/80 | HR 116 | Ht 63.0 in | Wt 212.0 lb

## 2016-09-13 DIAGNOSIS — G43009 Migraine without aura, not intractable, without status migrainosus: Secondary | ICD-10-CM

## 2016-09-13 DIAGNOSIS — E7800 Pure hypercholesterolemia, unspecified: Secondary | ICD-10-CM

## 2016-09-13 DIAGNOSIS — I10 Essential (primary) hypertension: Secondary | ICD-10-CM

## 2016-09-13 DIAGNOSIS — F319 Bipolar disorder, unspecified: Secondary | ICD-10-CM | POA: Diagnosis not present

## 2016-09-13 MED ORDER — VERAPAMIL HCL 40 MG PO TABS: 40.0000 mg | ORAL_TABLET | Freq: Two times a day (BID) | ORAL | 1 refills | Status: DC

## 2016-09-13 MED ORDER — VERAPAMIL HCL ER 240 MG PO CP24: 240.0000 mg | ORAL_CAPSULE | Freq: Every day | ORAL | 1 refills | Status: DC
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
## 2016-09-14 ENCOUNTER — Ambulatory Visit: Payer: Medicare Other | Admitting: Sports Medicine

## 2016-09-18 DIAGNOSIS — F3162 Bipolar disorder, current episode mixed, moderate: Secondary | ICD-10-CM | POA: Diagnosis not present

## 2016-09-18 DIAGNOSIS — F331 Major depressive disorder, recurrent, moderate: Secondary | ICD-10-CM | POA: Diagnosis not present

## 2016-09-18 DIAGNOSIS — F411 Generalized anxiety disorder: Secondary | ICD-10-CM | POA: Diagnosis not present

## 2016-09-20 ENCOUNTER — Telehealth: Payer: Self-pay | Admitting: Physical Therapy

## 2016-09-20 DIAGNOSIS — K219 Gastro-esophageal reflux disease without esophagitis: Secondary | ICD-10-CM | POA: Diagnosis not present

## 2016-09-20 DIAGNOSIS — K582 Mixed irritable bowel syndrome: Secondary | ICD-10-CM | POA: Diagnosis not present

## 2016-09-20 NOTE — Telephone Encounter (Signed)
09/11/16 got lost for PT eval, called pt back on 09/13/16 to reschedule. No return call

## 2016-09-24 DIAGNOSIS — M329 Systemic lupus erythematosus, unspecified: Secondary | ICD-10-CM | POA: Diagnosis not present

## 2016-10-10 DIAGNOSIS — M542 Cervicalgia: Secondary | ICD-10-CM | POA: Diagnosis not present

## 2016-10-10 DIAGNOSIS — M791 Myalgia: Secondary | ICD-10-CM | POA: Diagnosis not present

## 2016-10-10 DIAGNOSIS — M5481 Occipital neuralgia: Secondary | ICD-10-CM | POA: Diagnosis not present

## 2016-10-10 DIAGNOSIS — R51 Headache: Secondary | ICD-10-CM | POA: Diagnosis not present

## 2016-10-25 DIAGNOSIS — R1310 Dysphagia, unspecified: Secondary | ICD-10-CM | POA: Diagnosis not present

## 2016-10-30 DIAGNOSIS — G43709 Chronic migraine without aura, not intractable, without status migrainosus: Secondary | ICD-10-CM | POA: Diagnosis not present

## 2016-10-31 ENCOUNTER — Emergency Department (HOSPITAL_BASED_OUTPATIENT_CLINIC_OR_DEPARTMENT_OTHER)
Admission: EM | Admit: 2016-10-31 | Discharge: 2016-10-31 | Disposition: A | Discharge status: Home / Self Care | Payer: Managed Care, Other (non HMO) | Attending: Emergency Medicine | Admitting: Emergency Medicine

## 2016-10-31 ENCOUNTER — Encounter (HOSPITAL_BASED_OUTPATIENT_CLINIC_OR_DEPARTMENT_OTHER): Payer: Self-pay | Admitting: *Deleted

## 2016-10-31 DIAGNOSIS — R21 Rash and other nonspecific skin eruption: Secondary | ICD-10-CM | POA: Diagnosis present

## 2016-10-31 DIAGNOSIS — E039 Hypothyroidism, unspecified: Secondary | ICD-10-CM | POA: Insufficient documentation

## 2016-10-31 DIAGNOSIS — R234 Changes in skin texture: Secondary | ICD-10-CM | POA: Diagnosis not present

## 2016-10-31 DIAGNOSIS — Z7982 Long term (current) use of aspirin: Secondary | ICD-10-CM | POA: Insufficient documentation

## 2016-10-31 DIAGNOSIS — I10 Essential (primary) hypertension: Secondary | ICD-10-CM | POA: Diagnosis not present

## 2016-10-31 DIAGNOSIS — Z79899 Other long term (current) drug therapy: Secondary | ICD-10-CM | POA: Diagnosis not present
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
## 2016-10-31 NOTE — ED Triage Notes (Signed)
Pt c/o insect bite to right flank x 2 weeks

## 2016-11-01 ENCOUNTER — Encounter (HOSPITAL_BASED_OUTPATIENT_CLINIC_OR_DEPARTMENT_OTHER): Payer: Self-pay | Admitting: Emergency Medicine

## 2016-11-12 LAB — TSH: TSH: 0.33 — AB (ref 0.41–5.90)

## 2016-11-12 LAB — HEMOGLOBIN A1C: Hemoglobin A1C: 5.8

## 2016-11-13 ENCOUNTER — Encounter: Payer: Self-pay | Admitting: Family Medicine

## 2016-11-13 ENCOUNTER — Telehealth: Payer: Self-pay | Admitting: Family Medicine

## 2016-11-13 ENCOUNTER — Ambulatory Visit (INDEPENDENT_AMBULATORY_CARE_PROVIDER_SITE_OTHER): Payer: Medicare Other | Admitting: Family Medicine

## 2016-11-13 VITALS — BP 144/91 | HR 87 | Ht 64.0 in | Wt 208.0 lb

## 2016-11-13 DIAGNOSIS — M797 Fibromyalgia: Secondary | ICD-10-CM

## 2016-11-13 DIAGNOSIS — E039 Hypothyroidism, unspecified: Secondary | ICD-10-CM

## 2016-11-13 DIAGNOSIS — F319 Bipolar disorder, unspecified: Secondary | ICD-10-CM

## 2016-11-13 DIAGNOSIS — F5101 Primary insomnia: Secondary | ICD-10-CM

## 2016-11-13 DIAGNOSIS — L301 Dyshidrosis [pompholyx]: Secondary | ICD-10-CM

## 2016-11-13 MED ORDER — DOXEPIN HCL 3 MG PO TABS: 1.0000 | ORAL_TABLET | Freq: Every day | ORAL | 2 refills | Status: DC
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
## 2016-11-13 NOTE — Telephone Encounter (Signed)
Call for most recent OV and labs from [facility].

## 2016-11-14 NOTE — Telephone Encounter (Signed)
Called [facility]. Office closed at time of call. Will call back later.

## 2016-11-19 NOTE — Telephone Encounter (Signed)
Called [facility], spoke with [place]. She will fax over most recent OV note.

## 2016-11-29 ENCOUNTER — Telehealth: Payer: Self-pay | Admitting: Sports Medicine

## 2016-11-29 NOTE — Telephone Encounter (Signed)
Patient called advised that she has twisted her left knee and having extreme pain in the back of her knee area she cant walk and in pain. You do not have any openings today or tomorrow please adv. Thanks

## 2016-11-29 NOTE — Telephone Encounter (Signed)
Urgent care, Dr. Georgina Snell, or wait until I have an opening.

## 2016-11-29 NOTE — Telephone Encounter (Signed)
Okay I have called patient and adv her of what you said. Thanks

## 2016-12-03 ENCOUNTER — Ambulatory Visit: Payer: Medicare Other | Admitting: Physical Therapy

## 2016-12-04 ENCOUNTER — Encounter (HOSPITAL_BASED_OUTPATIENT_CLINIC_OR_DEPARTMENT_OTHER): Payer: Self-pay | Admitting: *Deleted

## 2016-12-04 ENCOUNTER — Emergency Department (HOSPITAL_BASED_OUTPATIENT_CLINIC_OR_DEPARTMENT_OTHER): Payer: Managed Care, Other (non HMO)

## 2016-12-04 ENCOUNTER — Emergency Department (HOSPITAL_BASED_OUTPATIENT_CLINIC_OR_DEPARTMENT_OTHER)
Admission: EM | Admit: 2016-12-04 | Discharge: 2016-12-04 | Disposition: A | Discharge status: Home / Self Care | Payer: Managed Care, Other (non HMO) | Attending: Emergency Medicine | Admitting: Emergency Medicine

## 2016-12-04 DIAGNOSIS — I10 Essential (primary) hypertension: Secondary | ICD-10-CM | POA: Insufficient documentation

## 2016-12-04 DIAGNOSIS — Z79899 Other long term (current) drug therapy: Secondary | ICD-10-CM | POA: Insufficient documentation

## 2016-12-04 DIAGNOSIS — Y999 Unspecified external cause status: Secondary | ICD-10-CM | POA: Diagnosis not present

## 2016-12-04 DIAGNOSIS — Y9384 Activity, sleeping: Secondary | ICD-10-CM | POA: Diagnosis not present

## 2016-12-04 DIAGNOSIS — X509XXA Other and unspecified overexertion or strenuous movements or postures, initial encounter: Secondary | ICD-10-CM | POA: Insufficient documentation

## 2016-12-04 DIAGNOSIS — Y92013 Bedroom of single-family (private) house as the place of occurrence of the external cause: Secondary | ICD-10-CM | POA: Diagnosis not present

## 2016-12-04 DIAGNOSIS — S8391XA Sprain of unspecified site of right knee, initial encounter: Secondary | ICD-10-CM

## 2016-12-04 DIAGNOSIS — Z7984 Long term (current) use of oral hypoglycemic drugs: Secondary | ICD-10-CM | POA: Insufficient documentation

## 2016-12-04 DIAGNOSIS — E039 Hypothyroidism, unspecified: Secondary | ICD-10-CM | POA: Insufficient documentation

## 2016-12-04 DIAGNOSIS — S8991XA Unspecified injury of right lower leg, initial encounter: Secondary | ICD-10-CM | POA: Diagnosis present

## 2016-12-04 MED ORDER — NAPROXEN 500 MG PO TABS: 500.0000 mg | ORAL_TABLET | Freq: Two times a day (BID) | ORAL | 0 refills | Status: DC

## 2016-12-04 MED ORDER — HYDROCODONE-ACETAMINOPHEN 5-325 MG PO TABS: 1.0000 | ORAL_TABLET | Freq: Four times a day (QID) | ORAL | 0 refills | Status: DC | PRN

## 2016-12-04 NOTE — ED Notes (Signed)
PMS intact before and after. Pt tolerated well. All questions answered. 

## 2016-12-04 NOTE — Discharge Instructions (Signed)
Naproxen as prescribed.  Hydrocodone as prescribed as needed for pain not relieved with naproxen.  Follow-up with your doctor on Friday as scheduled.

## 2016-12-04 NOTE — Telephone Encounter (Signed)
Pt called after hours nurse line regarding her knee pain. Attempted to contact Pt, no answer and no VM set up.
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
## 2016-12-04 NOTE — ED Triage Notes (Signed)
Pt ambulatory to triage, states about 3 weeks ago she twisted her left knee. Pain has persisted.

## 2016-12-04 NOTE — Telephone Encounter (Signed)
Rubby called and states she twisted her left knee. I advised patient to go and have her knee evaluated in at the Urgent Care.

## 2016-12-05 ENCOUNTER — Telehealth: Payer: Self-pay

## 2016-12-05 NOTE — Telephone Encounter (Signed)
Diane [name] called and states she twisted her left knee yesterday. She was seen in the ED and was diagnosed with a sprained knee. She received Hydrocodone for the pain. She complains the hydrocodone is not helping the severe pain she is in. She has requested a Fentanyl patch for the pain. She asked me to send the request to Dr Dianah Field. However, he has left for the day because he has a half day on Wednesday. Please advise.     She then jokingly stated if he didn't she would mess with his "Lambo".

## 2016-12-05 NOTE — Telephone Encounter (Signed)
We cannot use medication such as this for knee sprain. What might actually provide some significant relief for her may be actually draining some fluid off the knee particularly if it's swollen. That can actually provide some significant immediate relief. We can certainly see if Dr. Darene Lamer has anything tomorrow or Friday.

## 2016-12-05 NOTE — Telephone Encounter (Signed)
Patient has an appointment on Friday with Dr Dianah Field to follow up on knee pain. She would like to know what he would recommend she take for the pain. She states the tylenol in the hydrocodone interacts with another medication she takes and causes tachycardia. Please advise.

## 2016-12-06 NOTE — Telephone Encounter (Addendum)
Hi Angela,  Have her ice the knee 20 minutes 3-4 times per day, use the meloxicam daily, use a compression wrap, use arthritis strength Tylenol 2 tabs 3 times per day (I'm not seeing any medications in her list that would interact with Tylenol to cause tachycardia), get into see me for an injection, or at least have me look at it to see if we need an MRI, and stay away from my car.  Remind her that orthopedic injuries are impossible to diagnose over the phone or email.
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
## 2016-12-07 ENCOUNTER — Ambulatory Visit: Payer: Medicare Other | Admitting: Sports Medicine

## 2016-12-07 DIAGNOSIS — Z0189 Encounter for other specified special examinations: Secondary | ICD-10-CM

## 2016-12-11 ENCOUNTER — Ambulatory Visit: Payer: Medicare Other | Admitting: Physical Therapy

## 2016-12-12 ENCOUNTER — Ambulatory Visit: Payer: Medicare Other | Admitting: Physical Therapy

## 2016-12-25 NOTE — Telephone Encounter (Signed)
Call patient: I did receive a copy of her labs from [facility]. Her TSH was 0.327. This is a little too suppressed. If she's currently taking 60mcg daily of her levothyroxin recommend that she split 1 tab and take 1/2 tab one day a week.. For example, take half a tab on Saturday and then take a whole tab the other 6 days of the week. Then plan to recheck TSH in 8 weeks.  Her hemoglobin A1c was 5.8 which is in the prediabetic range.work on [employer]. Getting low sugar and low carb diet. Recommend some regular exercise and recheck A1c in 4-6 months.

## 2016-12-26 ENCOUNTER — Encounter: Payer: Self-pay | Admitting: Family Medicine

## 2016-12-26 ENCOUNTER — Ambulatory Visit: Payer: Medicare Other | Admitting: Physical Therapy

## 2016-12-26 LAB — CHG C-REACTIVE PROTEIN: CRP HIGH SENSITIVITY: 2.53

## 2016-12-26 LAB — T3, FREE: TRIIODOTHYRONINE, FREE, SERUM: 3
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
## 2016-12-27 ENCOUNTER — Telehealth: Payer: Self-pay | Admitting: Physical Therapy

## 2016-12-27 NOTE — Telephone Encounter (Signed)
12/12/16 had PT eval scheduled, 30 min late, rescheduled for 12/14/16. Pt cxl 12/14/16, rescheduled for 12/26/16. Patient cxl 12/26/16

## 2017-01-10 ENCOUNTER — Telehealth: Payer: Self-pay | Admitting: *Deleted

## 2017-01-10 DIAGNOSIS — Z77090 Contact with and (suspected) exposure to asbestos: Secondary | ICD-10-CM

## 2017-01-10 NOTE — Telephone Encounter (Signed)
Labs for asbestos testing.Diane [name] [place]

## 2017-01-11 NOTE — Telephone Encounter (Signed)
Called and informed pt that I have made multiple attempts to call [facility] [phone]) and the phone rings and no one answers and there is no VM. Advised her that the orders are in the system and that she will need to call our office to have the PFT done at her convenience.Diane [name] [place]

## 2017-01-16 ENCOUNTER — Other Ambulatory Visit: Payer: Self-pay | Admitting: Family Medicine

## 2017-01-16 DIAGNOSIS — Z77090 Contact with and (suspected) exposure to asbestos: Secondary | ICD-10-CM

## 2017-01-16 NOTE — Progress Notes (Signed)
Please see previous note. Patient reports acute asbestos exposure. According to poison control they are recommending that she get primary function tests done. These been ordered.

## 2017-01-17 ENCOUNTER — Ambulatory Visit: Payer: Medicare Other | Admitting: Family Medicine

## 2017-01-18 ENCOUNTER — Ambulatory Visit: Payer: Medicare Other | Admitting: Family Medicine

## 2017-01-22 ENCOUNTER — Ambulatory Visit (HOSPITAL_COMMUNITY): Payer: Medicare Other | Attending: Family Medicine

## 2017-01-22 LAB — PROCOLLAGEN TYPE I INTACT N TERMINAL PROPEPTIDE: Procollagen Type I Intact N Terminal Propeptide: 47 mcg/L (ref 22–104)

## 2017-02-13 ENCOUNTER — Encounter (HOSPITAL_COMMUNITY): Payer: Managed Care, Other (non HMO)

## 2017-02-19 ENCOUNTER — Other Ambulatory Visit: Payer: Self-pay | Admitting: Family Medicine

## 2017-02-20 ENCOUNTER — Ambulatory Visit (HOSPITAL_COMMUNITY): Payer: Managed Care, Other (non HMO)

## 2017-03-18 ENCOUNTER — Encounter (HOSPITAL_COMMUNITY): Payer: Managed Care, Other (non HMO)

## 2017-04-05 ENCOUNTER — Emergency Department (HOSPITAL_COMMUNITY): Payer: Managed Care, Other (non HMO)

## 2017-04-05 ENCOUNTER — Emergency Department (HOSPITAL_COMMUNITY)
Admission: EM | Admit: 2017-04-05 | Discharge: 2017-04-05 | Disposition: A | Discharge status: Home / Self Care | Payer: Managed Care, Other (non HMO) | Attending: Emergency Medicine | Admitting: Emergency Medicine

## 2017-04-05 ENCOUNTER — Encounter (HOSPITAL_COMMUNITY): Payer: Self-pay

## 2017-04-05 DIAGNOSIS — Y9389 Activity, other specified: Secondary | ICD-10-CM | POA: Diagnosis not present

## 2017-04-05 DIAGNOSIS — M25512 Pain in left shoulder: Secondary | ICD-10-CM | POA: Diagnosis not present

## 2017-04-05 DIAGNOSIS — Z79899 Other long term (current) drug therapy: Secondary | ICD-10-CM | POA: Insufficient documentation

## 2017-04-05 DIAGNOSIS — R200 Anesthesia of skin: Secondary | ICD-10-CM | POA: Diagnosis not present

## 2017-04-05 DIAGNOSIS — Z7984 Long term (current) use of oral hypoglycemic drugs: Secondary | ICD-10-CM | POA: Diagnosis not present

## 2017-04-05 DIAGNOSIS — M542 Cervicalgia: Secondary | ICD-10-CM | POA: Diagnosis present

## 2017-04-05 DIAGNOSIS — R1030 Lower abdominal pain, unspecified: Secondary | ICD-10-CM | POA: Diagnosis not present

## 2017-04-05 DIAGNOSIS — R079 Chest pain, unspecified: Secondary | ICD-10-CM | POA: Insufficient documentation

## 2017-04-05 DIAGNOSIS — I10 Essential (primary) hypertension: Secondary | ICD-10-CM | POA: Diagnosis not present

## 2017-04-05 DIAGNOSIS — M79672 Pain in left foot: Secondary | ICD-10-CM | POA: Insufficient documentation

## 2017-04-05 DIAGNOSIS — M79602 Pain in left arm: Secondary | ICD-10-CM | POA: Diagnosis not present

## 2017-04-05 DIAGNOSIS — Z7982 Long term (current) use of aspirin: Secondary | ICD-10-CM | POA: Insufficient documentation

## 2017-04-05 DIAGNOSIS — M25561 Pain in right knee: Secondary | ICD-10-CM | POA: Diagnosis not present

## 2017-04-05 DIAGNOSIS — M549 Dorsalgia, unspecified: Secondary | ICD-10-CM | POA: Diagnosis not present

## 2017-04-05 DIAGNOSIS — Y92410 Unspecified street and highway as the place of occurrence of the external cause: Secondary | ICD-10-CM | POA: Insufficient documentation

## 2017-04-05 DIAGNOSIS — Y999 Unspecified external cause status: Secondary | ICD-10-CM | POA: Diagnosis not present

## 2017-04-05 DIAGNOSIS — M25562 Pain in left knee: Secondary | ICD-10-CM | POA: Insufficient documentation

## 2017-04-05 DIAGNOSIS — R202 Paresthesia of skin: Secondary | ICD-10-CM | POA: Insufficient documentation

## 2017-04-05 DIAGNOSIS — E039 Hypothyroidism, unspecified: Secondary | ICD-10-CM | POA: Diagnosis not present

## 2017-04-05 LAB — I-STAT CHEM 8, ED
BUN: 3 mg/dL — AB (ref 6–20)
CREATININE: 0.5 mg/dL (ref 0.44–1.00)
Calcium, Ion: 1.07 mmol/L — ABNORMAL LOW (ref 1.15–1.40)
Chloride: 113 mmol/L — ABNORMAL HIGH (ref 101–111)
Glucose, Bld: 75 mg/dL (ref 65–99)
HCT: 25 % — ABNORMAL LOW (ref 36.0–46.0)
HEMOGLOBIN: 8.5 g/dL — AB (ref 12.0–15.0)
Potassium: 3 mmol/L — ABNORMAL LOW (ref 3.5–5.1)
SODIUM: 144 mmol/L (ref 135–145)
TCO2: 16 mmol/L — AB (ref 22–32)

## 2017-04-05 LAB — COMPREHENSIVE METABOLIC PANEL
ALBUMIN: 3.2 g/dL — AB (ref 3.5–5.0)
ALT: 13 U/L — ABNORMAL LOW (ref 14–54)
AST: 18 U/L (ref 15–41)
Alkaline Phosphatase: 54 U/L (ref 38–126)
Anion gap: 7 (ref 5–15)
BILIRUBIN TOTAL: 0.4 mg/dL (ref 0.3–1.2)
BUN: 5 mg/dL — ABNORMAL LOW (ref 6–20)
CHLORIDE: 114 mmol/L — AB (ref 101–111)
CO2: 17 mmol/L — ABNORMAL LOW (ref 22–32)
Calcium: 8 mg/dL — ABNORMAL LOW (ref 8.9–10.3)
Creatinine, Ser: 0.71 mg/dL (ref 0.44–1.00)
GFR calc Af Amer: >60 mL/min (ref >60)
Glucose, Bld: 81 mg/dL (ref 65–99)
POTASSIUM: 3.2 mmol/L — AB (ref 3.5–5.1)
Sodium: 138 mmol/L (ref 135–145)
TOTAL PROTEIN: 6.2 g/dL — AB (ref 6.5–8.1)

## 2017-04-05 LAB — CBC
HEMATOCRIT: 28 % — AB (ref 36.0–46.0)
Hemoglobin: 8.5 g/dL — ABNORMAL LOW (ref 12.0–15.0)
MCH: 21.8 pg — ABNORMAL LOW (ref 26.0–34.0)
MCHC: 30.4 g/dL (ref 30.0–36.0)
MCV: 71.8 fL — ABNORMAL LOW (ref 78.0–100.0)
Platelets: 280 10*3/uL (ref 150–400)
RBC: 3.9 MIL/uL (ref 3.87–5.11)
RDW: 20.5 % — ABNORMAL HIGH (ref 11.5–15.5)
WBC: 7 10*3/uL (ref 4.0–10.5)

## 2017-04-05 LAB — URINALYSIS, ROUTINE W REFLEX MICROSCOPIC
Bilirubin Urine: NEGATIVE
Glucose, UA: NEGATIVE mg/dL
Hgb urine dipstick: NEGATIVE
Ketones, ur: NEGATIVE mg/dL
LEUKOCYTES UA: NEGATIVE
NITRITE: NEGATIVE
Protein, ur: NEGATIVE mg/dL
SPECIFIC GRAVITY, URINE: 1.029 (ref 1.005–1.030)
pH: 6 (ref 5.0–8.0)

## 2017-04-05 LAB — PROTIME-INR
INR: 1.1
Prothrombin Time: 14.1 seconds (ref 11.4–15.2)

## 2017-04-05 LAB — SAMPLE TO BLOOD BANK

## 2017-04-05 LAB — ETHANOL: Alcohol, Ethyl (B): <10 mg/dL (ref <10)

## 2017-04-05 LAB — I-STAT CG4 LACTIC ACID, ED: LACTIC ACID, VENOUS: 0.76 mmol/L (ref 0.5–1.9)

## 2017-04-05 MED ORDER — FENTANYL CITRATE (PF) 100 MCG/2ML IJ SOLN
50.0000 ug | Freq: Once | INTRAMUSCULAR | Status: AC
  Administered 2017-04-05: 50 ug via INTRAVENOUS
  Filled 2017-04-05: qty 2

## 2017-04-05 MED ORDER — SODIUM CHLORIDE 0.9 % IV BOLUS (SEPSIS)
125.0000 mL | Freq: Once | INTRAVENOUS | Status: AC
  Administered 2017-04-05: 125 mL via INTRAVENOUS

## 2017-04-05 MED ORDER — ONDANSETRON HCL 4 MG/2ML IJ SOLN
4.0000 mg | Freq: Once | INTRAMUSCULAR | Status: AC
  Administered 2017-04-05: 4 mg via INTRAVENOUS
  Filled 2017-04-05: qty 2

## 2017-04-05 MED ORDER — IOPAMIDOL (ISOVUE-300) INJECTION 61%
INTRAVENOUS | Status: AC
  Administered 2017-04-05: 75 mL
  Filled 2017-04-05: qty 75

## 2017-04-05 NOTE — ED Notes (Signed)
Pt ambulated to bathroom with minimal assistance c/o left foot pain Dr. Wynelle Cleveland advised

## 2017-04-05 NOTE — ED Notes (Signed)
Pt still with radiology

## 2017-04-05 NOTE — ED Notes (Signed)
IV attempt x1

## 2017-04-05 NOTE — ED Notes (Signed)
Patient transported to CT 
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
## 2017-04-05 NOTE — ED Notes (Signed)
Pt stable, ambulatory, states understanding of discharge instructions 
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
## 2017-04-22 ENCOUNTER — Other Ambulatory Visit: Payer: Self-pay | Admitting: Family Medicine

## 2017-04-22 DIAGNOSIS — F411 Generalized anxiety disorder: Secondary | ICD-10-CM

## 2017-04-25 ENCOUNTER — Other Ambulatory Visit: Payer: Self-pay | Admitting: Family Medicine

## 2017-04-25 DIAGNOSIS — F411 Generalized anxiety disorder: Secondary | ICD-10-CM

## 2017-04-26 ENCOUNTER — Other Ambulatory Visit: Payer: Self-pay | Admitting: Family Medicine

## 2017-04-26 DIAGNOSIS — F411 Generalized anxiety disorder: Secondary | ICD-10-CM

## 2017-04-26 NOTE — Telephone Encounter (Signed)
**  PT SHOULD CONTACT DR. Corena Pilgrim HE LAST FILLED THIS FOR HER ON 02/07/2017. DO NOT RE SEND THIS REFILL REQUEST**

## 2017-05-02 ENCOUNTER — Telehealth: Payer: Self-pay | Admitting: *Deleted

## 2017-05-13 NOTE — Telephone Encounter (Signed)
closed

## 2017-05-28 ENCOUNTER — Telehealth: Payer: Self-pay | Admitting: Cardiology

## 2017-05-28 NOTE — Telephone Encounter (Signed)
New Message   Pt c/o of Chest Pain: STAT if CP now or developed within 24 hours  1. Are you having CP right now? yes   2. Are you experiencing any other symptoms (ex. SOB, nausea, vomiting, sweating)? Headache,lightheaded   3. How long have you been experiencing CP? Since 05/27/2017   4. Is your CP continuous or coming and going? continous  5. Have you taken Nitroglycerin? No      ?

## 2017-05-28 NOTE — Telephone Encounter (Signed)
Spoke with pt regarding message  Is having chest pain to left of sternum "weird different pain" also feels like heart is beating drum loud and lightheadedness noted for 2-3 days  Is wanting to be seen today .Will forward to Dr Stanford Breed for review .Adonis Housekeeper

## 2017-05-29 ENCOUNTER — Ambulatory Visit: Payer: Managed Care, Other (non HMO) | Admitting: Physician Assistant

## 2017-05-29 NOTE — Telephone Encounter (Signed)
[name]  

## 2017-05-29 NOTE — Telephone Encounter (Signed)
Patient has an appointment today

## 2017-06-06 ENCOUNTER — Ambulatory Visit (INDEPENDENT_AMBULATORY_CARE_PROVIDER_SITE_OTHER): Payer: Managed Care, Other (non HMO) | Admitting: Physician Assistant

## 2017-06-06 ENCOUNTER — Encounter: Payer: Self-pay | Admitting: Physician Assistant

## 2017-06-06 VITALS — BP 126/84 | HR 74 | Ht 66.0 in | Wt 200.4 lb

## 2017-06-06 DIAGNOSIS — R072 Precordial pain: Secondary | ICD-10-CM | POA: Diagnosis not present

## 2017-06-06 DIAGNOSIS — E785 Hyperlipidemia, unspecified: Secondary | ICD-10-CM

## 2017-06-06 DIAGNOSIS — E039 Hypothyroidism, unspecified: Secondary | ICD-10-CM | POA: Diagnosis not present

## 2017-06-06 DIAGNOSIS — I10 Essential (primary) hypertension: Secondary | ICD-10-CM

## 2017-06-06 DIAGNOSIS — R011 Cardiac murmur, unspecified: Secondary | ICD-10-CM | POA: Diagnosis not present

## 2017-06-06 DIAGNOSIS — R002 Palpitations: Secondary | ICD-10-CM
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
## 2017-06-06 NOTE — Patient Instructions (Signed)
Medication Instructions: Almyra Deforest, PA-C recommends that you continue on your current medications as directed. Please refer to the Current Medication list given to you today.  Labwork: Your physician recommends that you return for lab work at your convenience - FASTING.  Testing/Procedures: 1. Exercise tolerance test - Your physician has requested that you have an exercise tolerance test. For further information please visit [email or web address]. Please also follow instruction sheet, as given.  2. Echocardiogram - Your physician has requested that you have an echocardiogram. Echocardiography is a painless test that uses sound waves to create images of your heart. It provides your doctor with information about the size and shape of your heart and how well your heart's chambers and valves are working. This procedure takes approximately one hour. There are no restrictions for this procedure.  3. Cardiac event monitor - Your physician has recommended that you wear an event monitor. Event monitors are medical devices that record the heart's electrical activity. Doctors most often [place] these monitors to diagnose arrhythmias. Arrhythmias are problems with the speed or rhythm of the heartbeat. The monitor is a small, portable device. You can wear one while you do your normal daily activities. This is usually used to diagnose what is causing palpitations/syncope (passing out).  >>These tests will be performed at our [facility] location [place], [address]  Follow-up: Isaac Laud recommends that you schedule a follow-up appointment in 12 months with Dr Stanford Breed. You will receive a reminder letter in the mail two months in advance. If you don't receive a letter, please call our office to schedule the follow-up appointment.  If you need a refill on your cardiac medications before your next appointment, please call your pharmacy.

## 2017-06-07 ENCOUNTER — Telehealth: Payer: Self-pay | Admitting: Physician Assistant

## 2017-06-07 NOTE — Telephone Encounter (Signed)
Returned call to patient who saw H. Eulas Post, [place] yesterday. She does not know who was calling her. Reviewed chart and PA has ordered 3 tests that are not yet scheduled. Advised will notify scheduler who will call her to arrange appt(s)

## 2017-06-07 NOTE — Telephone Encounter (Signed)
Pt says she is returning a call,she did not know who called.

## 2017-06-08 ENCOUNTER — Encounter: Payer: Self-pay | Admitting: Physician Assistant

## 2017-06-19 ENCOUNTER — Telehealth: Payer: Self-pay | Admitting: Physician Assistant

## 2017-06-19 ENCOUNTER — Other Ambulatory Visit: Payer: Self-pay | Admitting: *Deleted

## 2017-06-19 ENCOUNTER — Encounter: Payer: Self-pay | Admitting: *Deleted

## 2017-06-19 DIAGNOSIS — E039 Hypothyroidism, unspecified: Secondary | ICD-10-CM

## 2017-06-19 DIAGNOSIS — R002 Palpitations: Secondary | ICD-10-CM

## 2017-06-19 DIAGNOSIS — R072 Precordial pain: Secondary | ICD-10-CM

## 2017-06-19 DIAGNOSIS — R011 Cardiac murmur, unspecified: Secondary | ICD-10-CM

## 2017-06-19 MED ORDER — VERAPAMIL HCL 40 MG PO TABS: ORAL_TABLET | ORAL | 2 refills | Status: DC

## 2017-06-19 NOTE — Telephone Encounter (Signed)
She may take a short acting 40mg  diltiazem on an as needed basis assume her HR is > 60 and systolic BP > 026 for breakthrough palpitation

## 2017-06-19 NOTE — Telephone Encounter (Signed)
Received call from patient c/o palpitations started 20-30 mins ago.  States she feels like her heart is pounding, she gets clammy then cold, fatigued.  Patient does not sound SOB on phone, able to speak in full, clear sentences.  States she was running around in her house trying to get stuff done when this started.  She did drink 1 cup of coffee this morning and she has been stressed out.  Unable to take BP/ HR at home, mother took pulse rate while on the phone HR 68.   Patient saw Almyra Deforest on 2/14, ETT, echo, monitor ordered and is scheduled for Friday 3/1.  Patient has not had lab work completed yet.  Strongly advised patient to get lab work completed, states she will do this today.     Advised I would send Almyra Deforest PA message for review/recommendations.    Patient aware and verbalized understanding.  Patient is not currently in any distress, HR 68, laughing and carrying a conversation with nurse and mother who is with patient.

## 2017-06-19 NOTE — Telephone Encounter (Signed)
Clarified with Almyra Deforest, PA.   Ok to take additional 40 mg Verapamil for palpitations if Hr >60 and SBP >100,.     Patient is aware and verbalized understanding.   New rx sent to pharmacy.

## 2017-06-19 NOTE — Telephone Encounter (Signed)
New Message   Pt c/o Shortness Of Breath: STAT if SOB developed within the last 24 hours or pt is noticeably SOB on the phone  1. Are you currently SOB (can you hear that pt is SOB on the phone)?yes  2. How long have you been experiencing SOB? Been more noticeable today   3. Are you SOB when sitting or when up moving around? anytime  4. Are you currently experiencing any other symptoms? Yes, fatigue

## 2017-06-20 LAB — CBC
Hematocrit: 31.8 % — ABNORMAL LOW (ref 34.0–46.6)
Hemoglobin: 9.7 g/dL — ABNORMAL LOW (ref 11.1–15.9)
MCH: 21.2 pg — ABNORMAL LOW (ref 26.6–33.0)
MCHC: 30.5 g/dL — ABNORMAL LOW (ref 31.5–35.7)
MCV: 70 fL — AB (ref 79–97)
PLATELETS: 364 10*3/uL (ref 150–379)
RBC: 4.57 x10E6/uL (ref 3.77–5.28)
RDW: 19.4 % — ABNORMAL HIGH (ref 12.3–15.4)
WBC: 7.4 10*3/uL (ref 3.4–10.8)

## 2017-06-20 LAB — BASIC METABOLIC PANEL
BUN/Creatinine Ratio: 13 (ref 9–23)
BUN: 10 mg/dL (ref 6–24)
CALCIUM: 9.4 mg/dL (ref 8.7–10.2)
CHLORIDE: 102 mmol/L (ref 96–106)
CO2: 21 mmol/L (ref 20–29)
Creatinine, Ser: 0.8 mg/dL (ref 0.57–1.00)
GFR calc non Af Amer: 89 mL/min/{1.73_m2} (ref >59)
GFR, EST AFRICAN AMERICAN: 103 mL/min/{1.73_m2} (ref >59)
Glucose: 107 mg/dL — ABNORMAL HIGH (ref 65–99)
POTASSIUM: 4.5 mmol/L (ref 3.5–5.2)
Sodium: 140 mmol/L (ref 134–144)

## 2017-06-20 LAB — TSH: TSH: 0.706 u[IU]/mL (ref 0.450–4.500)

## 2017-06-20 LAB — T4, FREE: Free T4: 1.11 ng/dL (ref 0.82–1.77)

## 2017-06-21 ENCOUNTER — Other Ambulatory Visit (HOSPITAL_COMMUNITY): Payer: Managed Care, Other (non HMO)

## 2017-06-25 ENCOUNTER — Telehealth: Payer: Self-pay | Admitting: Cardiology

## 2017-06-25 ENCOUNTER — Telehealth: Payer: Self-pay

## 2017-06-25 NOTE — Telephone Encounter (Signed)
Feels weak; having some constant chest pain, breathing not doing too good-having some shortness of breath; having a mild headache; little of lightheaded and dizziness. Had to reschedule ECHO due to tech having the flu and her having a death in the family. States as soon as someone raises her voice she cant take it. Had a PE in leg in the past and concerned this could be it. Recently fell and has bad bruises. Please advise!

## 2017-06-25 NOTE — Telephone Encounter (Signed)
Spoke with pt, she reports ongoing chest pain that is dull in nature and changes with movement or taking a deep breath and stress. She was told to take tylenol for the discomfort. Encouraged the patient to keep her appointments for testing. She will call back if symptoms worsen. Pt agreed with this plan.

## 2017-06-28 ENCOUNTER — Other Ambulatory Visit (HOSPITAL_COMMUNITY): Payer: Managed Care, Other (non HMO)

## 2017-07-09 ENCOUNTER — Ambulatory Visit (INDEPENDENT_AMBULATORY_CARE_PROVIDER_SITE_OTHER): Payer: Managed Care, Other (non HMO)

## 2017-07-09 ENCOUNTER — Other Ambulatory Visit: Payer: Self-pay

## 2017-07-09 ENCOUNTER — Ambulatory Visit (HOSPITAL_COMMUNITY): Payer: Managed Care, Other (non HMO) | Attending: Physician Assistant

## 2017-07-09 VITALS — BP 146/97

## 2017-07-09 DIAGNOSIS — R002 Palpitations: Secondary | ICD-10-CM | POA: Insufficient documentation

## 2017-07-09 DIAGNOSIS — R079 Chest pain, unspecified: Secondary | ICD-10-CM | POA: Diagnosis not present

## 2017-07-09 DIAGNOSIS — R072 Precordial pain: Secondary | ICD-10-CM

## 2017-07-09 DIAGNOSIS — R011 Cardiac murmur, unspecified: Secondary | ICD-10-CM | POA: Diagnosis not present

## 2017-07-09 DIAGNOSIS — E785 Hyperlipidemia, unspecified: Secondary | ICD-10-CM | POA: Insufficient documentation

## 2017-07-09 LAB — EXERCISE TOLERANCE TEST
CHL CUP MPHR: 175 {beats}/min
CHL CUP RESTING HR STRESS: 95 {beats}/min
CSEPEDS: 35 s
CSEPHR: 87 %
CSEPPHR: 153 {beats}/min
Estimated workload: 5.2 METS
Exercise duration (min): 3 min
RPE: 17

## 2017-07-09 MED ORDER — PERFLUTREN LIPID MICROSPHERE
1.0000 mL | INTRAVENOUS | Status: AC | PRN
  Administered 2017-07-09: 1 mL via INTRAVENOUS

## 2017-07-12 ENCOUNTER — Telehealth: Payer: Self-pay

## 2017-07-12 NOTE — Telephone Encounter (Signed)
Patient wanted to know if there was anything she can do or how can she slow down a leaky leaky valve?

## 2017-07-12 NOTE — Telephone Encounter (Signed)
Nothing that need to be done. She only has minimal amount of leakage, not be concerned of. As we grow older, we will have more leakage with age.

## 2017-07-16 NOTE — Telephone Encounter (Signed)
Patient voiced understanding.

## 2017-07-17 ENCOUNTER — Telehealth: Payer: Self-pay | Admitting: Physician Assistant

## 2017-07-17 NOTE — Telephone Encounter (Signed)
Incoming call from the patient. She stated that she has had increased weakness and tiredness over the last three weeks. She denies chest pain but says that she can feel "sluggishness in her chest" She denies shortness of breath.   The patient has had a PE in the past and denies the same symptoms this time. She stated that she is just tired and it is progressively getting worse.  Recent Echo and exercise test were normal. Message routed to the provider and PA that last saw her for their recommendations.

## 2017-07-17 NOTE — Telephone Encounter (Signed)
New Message:   Patient c/o Palpitations:  High priority if patient c/o lightheadedness, shortness of breath, or chest pain  1) How long have you had palpitations/irregular HR/ Afib? Are you having the symptoms now? 3 weeks but getting worse  2) Are you currently experiencing lightheadedness, SOB or CP? SOB  3) Do you have a history of afib (atrial fibrillation) or irregular heart rhythm? Irregular heart rythm  4) Have you checked your BP or HR? (document readings if available): No  5) Are you experiencing any other symptoms? Tightening in chest

## 2017-07-17 NOTE — Telephone Encounter (Signed)
Would have pt fu with her primary care for further evaluation Kirk Ruths

## 2017-07-17 NOTE — Telephone Encounter (Signed)
Patient has been made aware of recommendation and verbalized her understanding.

## 2017-07-30 ENCOUNTER — Telehealth: Payer: Self-pay | Admitting: Physician Assistant

## 2017-07-30 NOTE — Telephone Encounter (Signed)
F/U Call: ° °Patient returning call °

## 2017-07-30 NOTE — Telephone Encounter (Signed)
Spoke to the patient myself, she has been having prolonged chest pain all day since she woke up. She says her chest pain is worse with exertion or emotional stress or palpation. Better with deep inspiration. I still think her chest pain is atypical, however given the persistent nature and worsening symptom, I recommended her to go to ED and at least to be ruled out by troponin and d-dimer (she had a h/o PE). She was agreeable

## 2017-07-30 NOTE — Telephone Encounter (Signed)
Returned call to patient. She is calling for [place] PA. She wants to know why her chest is hurting. She had a normal ETT, echo. She is not currently wearing her monitor d/t reaction to the leads - she has to take it off occasionally but when she does wear it, she has recorded symptoms of chest pain on her monitor. She reports her chest pain feels like someone is squeezing her chest. She notices the symptoms with stress, when she first wakes up without moving, or while thinking about something. She states with stress her heart beat changes. She states her chest pain follows palpitations, changes in her heart beat, fluttering. She is getting frustrated b/c she likes "solutions" and she is not getting solutions. She wants to work out. She gets stressed and has to breathe, meditate to relax, help her symptoms.   Reiterated to patient that her testing so far is normal but she is certain there is something wrong. Advised would route to PA for follow up. Offered an appointment but she declined.

## 2017-07-30 NOTE — Telephone Encounter (Signed)
LMTCB

## 2017-07-30 NOTE — Telephone Encounter (Signed)
New message  Pt verbalized that she is calling for RN  The device is making her have a skin irritation and   She states that it is making her bleed and she wants to discontinue it  She said she has used oils, and Vaseline to the area  Please contact pt

## 2017-07-31 NOTE — Telephone Encounter (Signed)
Per chart review, patient has not gone to a [facility] ED for evaluation.  Encounter closed.

## 2017-08-13 ENCOUNTER — Telehealth: Payer: Self-pay | Admitting: Physician Assistant

## 2017-08-13 NOTE — Telephone Encounter (Signed)
New message ° °Pt verbalized that she is returning call for RN °

## 2017-08-19 NOTE — Telephone Encounter (Signed)
Called pt back line busy unable to leave a message

## 2017-08-19 NOTE — Telephone Encounter (Signed)
Pt returning call to nurse concerning results. °

## 2017-08-20 NOTE — Telephone Encounter (Signed)
PHONE LINE BUSY

## 2017-08-20 NOTE — Telephone Encounter (Signed)
Phone rings busy

## 2017-08-21 NOTE — Telephone Encounter (Signed)
Line remains busy .Adonis Housekeeper

## 2017-09-02 ENCOUNTER — Encounter: Payer: Self-pay | Admitting: Family Medicine

## 2017-09-09 ENCOUNTER — Encounter: Payer: Self-pay | Admitting: Family Medicine

## 2017-09-09 DIAGNOSIS — K224 Dyskinesia of esophagus: Secondary | ICD-10-CM | POA: Insufficient documentation

## 2017-09-23 LAB — HM MAMMOGRAPHY

## 2017-10-14 ENCOUNTER — Telehealth: Payer: Self-pay | Admitting: Physician Assistant

## 2017-10-14 ENCOUNTER — Emergency Department (HOSPITAL_COMMUNITY): Payer: Managed Care, Other (non HMO)

## 2017-10-14 ENCOUNTER — Emergency Department (HOSPITAL_COMMUNITY)
Admission: EM | Admit: 2017-10-14 | Discharge: 2017-10-15 | Disposition: A | Discharge status: Home / Self Care | Payer: Managed Care, Other (non HMO) | Attending: Emergency Medicine | Admitting: Emergency Medicine

## 2017-10-14 ENCOUNTER — Encounter (HOSPITAL_COMMUNITY): Payer: Self-pay | Admitting: Emergency Medicine

## 2017-10-14 DIAGNOSIS — S8002XA Contusion of left knee, initial encounter: Secondary | ICD-10-CM | POA: Diagnosis not present

## 2017-10-14 DIAGNOSIS — Y9389 Activity, other specified: Secondary | ICD-10-CM | POA: Insufficient documentation

## 2017-10-14 DIAGNOSIS — W19XXXA Unspecified fall, initial encounter: Secondary | ICD-10-CM | POA: Insufficient documentation

## 2017-10-14 DIAGNOSIS — Z7982 Long term (current) use of aspirin: Secondary | ICD-10-CM | POA: Diagnosis not present

## 2017-10-14 DIAGNOSIS — R079 Chest pain, unspecified: Secondary | ICD-10-CM | POA: Diagnosis not present

## 2017-10-14 DIAGNOSIS — Z79899 Other long term (current) drug therapy: Secondary | ICD-10-CM | POA: Diagnosis not present

## 2017-10-14 DIAGNOSIS — R55 Syncope and collapse: Secondary | ICD-10-CM

## 2017-10-14 DIAGNOSIS — M25512 Pain in left shoulder: Secondary | ICD-10-CM | POA: Insufficient documentation

## 2017-10-14 DIAGNOSIS — I10 Essential (primary) hypertension: Secondary | ICD-10-CM | POA: Insufficient documentation

## 2017-10-14 DIAGNOSIS — S63502A Unspecified sprain of left wrist, initial encounter: Secondary | ICD-10-CM

## 2017-10-14 DIAGNOSIS — Y929 Unspecified place or not applicable: Secondary | ICD-10-CM | POA: Insufficient documentation

## 2017-10-14 DIAGNOSIS — Y999 Unspecified external cause status: Secondary | ICD-10-CM | POA: Diagnosis not present

## 2017-10-14 DIAGNOSIS — Z7984 Long term (current) use of oral hypoglycemic drugs: Secondary | ICD-10-CM | POA: Diagnosis not present

## 2017-10-14 DIAGNOSIS — E039 Hypothyroidism, unspecified: Secondary | ICD-10-CM | POA: Insufficient documentation

## 2017-10-14 DIAGNOSIS — D649 Anemia, unspecified: Secondary | ICD-10-CM

## 2017-10-14 DIAGNOSIS — S6992XA Unspecified injury of left wrist, hand and finger(s), initial encounter: Secondary | ICD-10-CM | POA: Diagnosis present

## 2017-10-14 LAB — CBC
HCT: 32.5 % — ABNORMAL LOW (ref 36.0–46.0)
HEMOGLOBIN: 9.8 g/dL — AB (ref 12.0–15.0)
MCH: 20.4 pg — ABNORMAL LOW (ref 26.0–34.0)
MCHC: 30.2 g/dL (ref 30.0–36.0)
MCV: 67.6 fL — ABNORMAL LOW (ref 78.0–100.0)
Platelets: 269 10*3/uL (ref 150–400)
RBC: 4.81 MIL/uL (ref 3.87–5.11)
RDW: 19.9 % — ABNORMAL HIGH (ref 11.5–15.5)
WBC: 8.9 10*3/uL (ref 4.0–10.5)

## 2017-10-14 LAB — BASIC METABOLIC PANEL
ANION GAP: 9 (ref 5–15)
BUN: 11 mg/dL (ref 6–20)
CALCIUM: 9.3 mg/dL (ref 8.9–10.3)
CO2: 23 mmol/L (ref 22–32)
Chloride: 105 mmol/L (ref 101–111)
Creatinine, Ser: 0.89 mg/dL (ref 0.44–1.00)
Glucose, Bld: 102 mg/dL — ABNORMAL HIGH (ref 65–99)
Potassium: 4 mmol/L (ref 3.5–5.1)
SODIUM: 137 mmol/L (ref 135–145)

## 2017-10-14 LAB — I-STAT TROPONIN, ED: TROPONIN I, POC: 0.02 ng/mL (ref 0.00–0.08)

## 2017-10-14 LAB — I-STAT BETA HCG BLOOD, ED (MC, WL, AP ONLY): I-stat hCG, quantitative: <5 m[IU]/mL (ref <5)

## 2017-10-14 MED ORDER — KETOROLAC TROMETHAMINE 60 MG/2ML IM SOLN
60.0000 mg | Freq: Once | INTRAMUSCULAR | Status: AC
  Administered 2017-10-14: 60 mg via INTRAMUSCULAR
  Filled 2017-10-14: qty 2

## 2017-10-14 NOTE — Telephone Encounter (Signed)
Spoke with pt who states she has been experiencing left sided chest pain that is going on for about 20 mins now. She reports she is feeling dizzy and have chills along with SOB.  Pt states she also had the urge to defecate and was unable to make it. She reports she had PE before and it feels like those symptoms but worse. Pt advised to contact EMS and report to the ED for further evaluations. Pt verbalized understanding.

## 2017-10-14 NOTE — ED Triage Notes (Signed)
Pt reports that today she got dizzy and fell into the floor. Also c/o chest pains. Reports hx PE and mitral valve. Pt takes daily ASA daily.
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
## 2017-10-14 NOTE — Telephone Encounter (Signed)
New Message     Pt c/o of Chest Pain: STAT if CP now or developed within 24 hours  1. Are you having CP right now? yes  2. Are you experiencing any other symptoms (ex. SOB, nausea, vomiting, sweating)? SOB  3. How long have you been experiencing CP? 15 minutes  4. Is your CP continuous or coming and going? continuous 5. Have you taken Nitroglycerin?  ?

## 2017-10-15 ENCOUNTER — Emergency Department (HOSPITAL_COMMUNITY): Payer: Managed Care, Other (non HMO)

## 2017-10-15 ENCOUNTER — Encounter (HOSPITAL_COMMUNITY): Payer: Self-pay

## 2017-10-15 LAB — D-DIMER, QUANTITATIVE (NOT AT ARMC): D DIMER QUANT: 0.74 ug{FEU}/mL — AB (ref 0.00–0.50)

## 2017-10-15 MED ORDER — IOPAMIDOL (ISOVUE-370) INJECTION 76%
100.0000 mL | Freq: Once | INTRAVENOUS | Status: AC | PRN
  Administered 2017-10-15: 100 mL via INTRAVENOUS

## 2017-10-15 MED ORDER — IOPAMIDOL (ISOVUE-370) INJECTION 76%
INTRAVENOUS | Status: AC
  Filled 2017-10-15: qty 100

## 2017-10-15 MED ORDER — HYDROCODONE-ACETAMINOPHEN 5-325 MG PO TABS
1.0000 | ORAL_TABLET | Freq: Once | ORAL | Status: AC
  Administered 2017-10-15: 1 via ORAL
  Filled 2017-10-15: qty 1

## 2017-10-15 NOTE — Discharge Instructions (Signed)
Please follow-up with your family doctor and cardiology if you continue to have dizziness, chest pain, palpitations.  Your work-up today showed no evidence of blood clots.  Your hemoglobin is slightly low, please follow-up to have that rechecked and start taking iron supplements.  All of your x-rays are negative today.  Ice and elevate your wrist, knee, shoulder.  Sling as needed.

## 2017-12-26 ENCOUNTER — Other Ambulatory Visit: Payer: Self-pay | Admitting: Physician Assistant

## 2017-12-31 ENCOUNTER — Emergency Department (HOSPITAL_COMMUNITY)
Admission: EM | Admit: 2017-12-31 | Discharge: 2017-12-31 | Disposition: A | Discharge status: Home / Self Care | Payer: Managed Care, Other (non HMO) | Attending: Emergency Medicine | Admitting: Emergency Medicine

## 2017-12-31 ENCOUNTER — Emergency Department (HOSPITAL_COMMUNITY): Payer: Managed Care, Other (non HMO)

## 2017-12-31 ENCOUNTER — Other Ambulatory Visit: Payer: Self-pay

## 2017-12-31 DIAGNOSIS — R51 Headache: Secondary | ICD-10-CM | POA: Insufficient documentation

## 2017-12-31 DIAGNOSIS — S161XXA Strain of muscle, fascia and tendon at neck level, initial encounter: Secondary | ICD-10-CM

## 2017-12-31 DIAGNOSIS — Z79899 Other long term (current) drug therapy: Secondary | ICD-10-CM | POA: Insufficient documentation

## 2017-12-31 DIAGNOSIS — Z7984 Long term (current) use of oral hypoglycemic drugs: Secondary | ICD-10-CM | POA: Insufficient documentation

## 2017-12-31 DIAGNOSIS — M545 Low back pain, unspecified: Secondary | ICD-10-CM

## 2017-12-31 DIAGNOSIS — Z7982 Long term (current) use of aspirin: Secondary | ICD-10-CM | POA: Insufficient documentation

## 2017-12-31 DIAGNOSIS — S199XXA Unspecified injury of neck, initial encounter: Secondary | ICD-10-CM | POA: Diagnosis present

## 2017-12-31 DIAGNOSIS — I10 Essential (primary) hypertension: Secondary | ICD-10-CM | POA: Insufficient documentation

## 2017-12-31 DIAGNOSIS — Y9389 Activity, other specified: Secondary | ICD-10-CM | POA: Diagnosis not present

## 2017-12-31 DIAGNOSIS — R52 Pain, unspecified: Secondary | ICD-10-CM

## 2017-12-31 DIAGNOSIS — Y999 Unspecified external cause status: Secondary | ICD-10-CM | POA: Diagnosis not present

## 2017-12-31 DIAGNOSIS — E039 Hypothyroidism, unspecified: Secondary | ICD-10-CM | POA: Diagnosis not present

## 2017-12-31 DIAGNOSIS — Y92410 Unspecified street and highway as the place of occurrence of the external cause: Secondary | ICD-10-CM | POA: Insufficient documentation

## 2017-12-31 MED ORDER — IBUPROFEN 800 MG PO TABS
800.0000 mg | ORAL_TABLET | Freq: Once | ORAL | Status: AC
  Administered 2017-12-31: 800 mg via ORAL
  Filled 2017-12-31: qty 1
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
## 2017-12-31 NOTE — Discharge Instructions (Signed)
Follow up with your doctor for continued or worsening symptoms. Take ibuprofen or tylenol for pain
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
## 2017-12-31 NOTE — ED Notes (Signed)
Pt returned from [place] and CT

## 2017-12-31 NOTE — ED Notes (Signed)
Patient transported to X-ray 

## 2017-12-31 NOTE — ED Notes (Signed)
Pt ambulated to restroom without difficulty

## 2018-01-02 ENCOUNTER — Encounter: Payer: Self-pay | Admitting: Hematology & Oncology

## 2018-01-09 ENCOUNTER — Encounter: Payer: Self-pay | Admitting: Family Medicine
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
## 2018-01-21 ENCOUNTER — Other Ambulatory Visit: Payer: Self-pay | Admitting: Family

## 2018-01-21 DIAGNOSIS — D649 Anemia, unspecified: Secondary | ICD-10-CM

## 2018-01-22 ENCOUNTER — Inpatient Hospital Stay: Payer: Managed Care, Other (non HMO) | Admitting: Family

## 2018-01-22 ENCOUNTER — Inpatient Hospital Stay: Payer: Managed Care, Other (non HMO)

## 2018-01-27 ENCOUNTER — Other Ambulatory Visit: Payer: Self-pay | Admitting: Hematology

## 2018-01-27 DIAGNOSIS — D509 Iron deficiency anemia, unspecified: Secondary | ICD-10-CM

## 2018-01-28 ENCOUNTER — Ambulatory Visit: Payer: Managed Care, Other (non HMO) | Admitting: Hematology

## 2018-01-28 ENCOUNTER — Other Ambulatory Visit: Payer: Managed Care, Other (non HMO)

## 2018-01-29 ENCOUNTER — Other Ambulatory Visit: Payer: Self-pay | Admitting: Hematology

## 2018-01-30 ENCOUNTER — Other Ambulatory Visit: Payer: Self-pay

## 2018-01-30 ENCOUNTER — Inpatient Hospital Stay: Payer: Managed Care, Other (non HMO) | Attending: Hematology

## 2018-01-30 ENCOUNTER — Inpatient Hospital Stay (HOSPITAL_BASED_OUTPATIENT_CLINIC_OR_DEPARTMENT_OTHER): Payer: Managed Care, Other (non HMO) | Admitting: Hematology

## 2018-01-30 ENCOUNTER — Encounter: Payer: Self-pay | Admitting: Hematology

## 2018-01-30 VITALS — BP 145/86 | HR 92 | Temp 98.4°F | Resp 18 | Wt 200.0 lb

## 2018-01-30 DIAGNOSIS — E282 Polycystic ovarian syndrome: Secondary | ICD-10-CM | POA: Diagnosis not present

## 2018-01-30 DIAGNOSIS — K210 Gastro-esophageal reflux disease with esophagitis: Secondary | ICD-10-CM | POA: Diagnosis not present

## 2018-01-30 DIAGNOSIS — E039 Hypothyroidism, unspecified: Secondary | ICD-10-CM | POA: Insufficient documentation

## 2018-01-30 DIAGNOSIS — D509 Iron deficiency anemia, unspecified: Secondary | ICD-10-CM

## 2018-01-30 DIAGNOSIS — M329 Systemic lupus erythematosus, unspecified: Secondary | ICD-10-CM | POA: Insufficient documentation

## 2018-01-30 DIAGNOSIS — Z7982 Long term (current) use of aspirin: Secondary | ICD-10-CM | POA: Insufficient documentation

## 2018-01-30 DIAGNOSIS — Z7984 Long term (current) use of oral hypoglycemic drugs: Secondary | ICD-10-CM | POA: Insufficient documentation

## 2018-01-30 DIAGNOSIS — Z86711 Personal history of pulmonary embolism: Secondary | ICD-10-CM | POA: Diagnosis not present

## 2018-01-30 DIAGNOSIS — M545 Low back pain: Secondary | ICD-10-CM | POA: Insufficient documentation

## 2018-01-30 DIAGNOSIS — K209 Esophagitis, unspecified without bleeding: Secondary | ICD-10-CM

## 2018-01-30 DIAGNOSIS — M069 Rheumatoid arthritis, unspecified: Secondary | ICD-10-CM | POA: Diagnosis not present

## 2018-01-30 DIAGNOSIS — F419 Anxiety disorder, unspecified: Secondary | ICD-10-CM | POA: Diagnosis not present

## 2018-01-30 DIAGNOSIS — Z79899 Other long term (current) drug therapy: Secondary | ICD-10-CM

## 2018-01-30 DIAGNOSIS — Z800 Family history of malignant neoplasm of digestive organs: Secondary | ICD-10-CM | POA: Diagnosis not present

## 2018-01-30 DIAGNOSIS — G43909 Migraine, unspecified, not intractable, without status migrainosus: Secondary | ICD-10-CM | POA: Diagnosis not present

## 2018-01-30 DIAGNOSIS — I10 Essential (primary) hypertension: Secondary | ICD-10-CM

## 2018-01-30 DIAGNOSIS — Z801 Family history of malignant neoplasm of trachea, bronchus and lung: Secondary | ICD-10-CM | POA: Diagnosis not present

## 2018-01-30 DIAGNOSIS — D649 Anemia, unspecified: Secondary | ICD-10-CM

## 2018-01-30 DIAGNOSIS — D500 Iron deficiency anemia secondary to blood loss (chronic): Secondary | ICD-10-CM | POA: Diagnosis present

## 2018-01-30 DIAGNOSIS — M542 Cervicalgia: Secondary | ICD-10-CM | POA: Insufficient documentation

## 2018-01-30 DIAGNOSIS — M797 Fibromyalgia: Secondary | ICD-10-CM

## 2018-01-30 DIAGNOSIS — F319 Bipolar disorder, unspecified: Secondary | ICD-10-CM

## 2018-01-30 LAB — CBC WITH DIFFERENTIAL (CANCER CENTER ONLY)
ABS IMMATURE GRANULOCYTES: 0.02 10*3/uL (ref 0.00–0.07)
Basophils Absolute: 0.1 10*3/uL (ref 0.0–0.1)
Basophils Relative: 1 %
EOS PCT: 1 %
Eosinophils Absolute: 0.1 10*3/uL (ref 0.0–0.5)
HEMATOCRIT: 35.7 % — AB (ref 36.0–46.0)
HEMOGLOBIN: 9.8 g/dL — AB (ref 12.0–15.0)
Immature Granulocytes: 0 %
LYMPHS PCT: 33 %
Lymphs Abs: 2.4 10*3/uL (ref 0.7–4.0)
MCH: 19.6 pg — AB (ref 26.0–34.0)
MCHC: 27.5 g/dL — AB (ref 30.0–36.0)
MCV: 71.3 fL — AB (ref 80.0–100.0)
MONO ABS: 0.5 10*3/uL (ref 0.1–1.0)
MONOS PCT: 7 %
NEUTROS ABS: 4.1 10*3/uL (ref 1.7–7.7)
Neutrophils Relative %: 58 %
Platelet Count: 374 10*3/uL (ref 150–400)
RBC: 5.01 MIL/uL (ref 3.87–5.11)
RDW: 25.3 % — ABNORMAL HIGH (ref 11.5–15.5)
WBC: 7 10*3/uL (ref 4.0–10.5)
nRBC: 0 % (ref 0.0–0.2)

## 2018-01-30 LAB — SAMPLE TO BLOOD BANK

## 2018-01-30 LAB — CMP (CANCER CENTER ONLY)
ALBUMIN: 3.6 g/dL (ref 3.5–5.0)
ALK PHOS: 81 U/L (ref 26–84)
ALT: 20 U/L (ref 10–47)
AST: 21 U/L (ref 11–38)
Anion gap: 7 (ref 5–15)
BUN: 13 mg/dL (ref 7–22)
CHLORIDE: 110 mmol/L — AB (ref 98–108)
CO2: 25 mmol/L (ref 18–33)
CREATININE: 1 mg/dL (ref 0.60–1.20)
Calcium: 9.4 mg/dL (ref 8.0–10.3)
Glucose, Bld: 83 mg/dL (ref 73–118)
Potassium: 4.1 mmol/L (ref 3.3–4.7)
SODIUM: 142 mmol/L (ref 128–145)
Total Bilirubin: 0.4 mg/dL (ref 0.2–1.6)
Total Protein: 7.9 g/dL (ref 6.4–8.1)

## 2018-01-30 LAB — RETICULOCYTES
IMMATURE RETIC FRACT: 9.7 % (ref 2.3–15.9)
RBC.: 5.01 MIL/uL (ref 3.87–5.11)
RETIC CT PCT: 1.1 % (ref 0.4–3.1)
Retic Count, Absolute: 56.1 10*3/uL (ref 19.0–186.0)

## 2018-01-30 LAB — SAVE SMEAR

## 2018-01-30 LAB — VITAMIN B12: Vitamin B-12: 708 pg/mL (ref 180–914)
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
## 2018-01-31 LAB — IRON AND TIBC
Iron: 34 ug/dL — ABNORMAL LOW (ref 41–142)
Saturation Ratios: 7 % — ABNORMAL LOW (ref 21–57)
TIBC: 491 ug/dL — ABNORMAL HIGH (ref 236–444)
UIBC: 457 ug/dL

## 2018-01-31 LAB — FERRITIN: FERRITIN: 12 ng/mL (ref 11–307)

## 2018-01-31 LAB — LACTATE DEHYDROGENASE: LDH: 197 U/L — ABNORMAL HIGH (ref 98–192)

## 2018-01-31 NOTE — Addendum Note (Signed)
Addended by: Tish Men on: 01/31/2018 12:11 PM   Modules accepted: Orders

## 2018-02-03 ENCOUNTER — Telehealth: Payer: Self-pay

## 2018-02-07 ENCOUNTER — Inpatient Hospital Stay: Payer: Managed Care, Other (non HMO)

## 2018-02-14 ENCOUNTER — Inpatient Hospital Stay: Payer: Managed Care, Other (non HMO)

## 2018-02-14 VITALS — BP 117/67 | HR 90 | Temp 98.8°F

## 2018-02-14 DIAGNOSIS — D500 Iron deficiency anemia secondary to blood loss (chronic): Secondary | ICD-10-CM | POA: Diagnosis not present

## 2018-02-14 MED ORDER — SODIUM CHLORIDE 0.9 % IV SOLN
510.0000 mg | Freq: Once | INTRAVENOUS | Status: AC
  Administered 2018-02-14: 510 mg via INTRAVENOUS
  Filled 2018-02-14: qty 17

## 2018-02-14 MED ORDER — SODIUM CHLORIDE 0.9 % IV SOLN
Freq: Once | INTRAVENOUS | Status: AC
  Administered 2018-02-14: 15:00:00 via INTRAVENOUS
  Filled 2018-02-14: qty 250

## 2018-02-14 NOTE — Patient Instructions (Signed)

## 2018-02-19 ENCOUNTER — Ambulatory Visit: Payer: Managed Care, Other (non HMO) | Admitting: Hematology & Oncology

## 2018-02-19 ENCOUNTER — Other Ambulatory Visit: Payer: Managed Care, Other (non HMO)

## 2018-02-21 ENCOUNTER — Ambulatory Visit: Payer: Managed Care, Other (non HMO)

## 2018-03-03 ENCOUNTER — Other Ambulatory Visit: Payer: Self-pay

## 2018-03-03 ENCOUNTER — Emergency Department (HOSPITAL_COMMUNITY)
Admission: EM | Admit: 2018-03-03 | Discharge: 2018-03-03 | Disposition: A | Discharge status: Home / Self Care | Payer: Managed Care, Other (non HMO) | Attending: Emergency Medicine | Admitting: Emergency Medicine

## 2018-03-03 ENCOUNTER — Emergency Department (HOSPITAL_COMMUNITY): Payer: Managed Care, Other (non HMO)

## 2018-03-03 ENCOUNTER — Encounter (HOSPITAL_COMMUNITY): Payer: Self-pay | Admitting: *Deleted

## 2018-03-03 DIAGNOSIS — I959 Hypotension, unspecified: Secondary | ICD-10-CM | POA: Diagnosis not present

## 2018-03-03 DIAGNOSIS — E039 Hypothyroidism, unspecified: Secondary | ICD-10-CM | POA: Diagnosis not present

## 2018-03-03 DIAGNOSIS — Z79899 Other long term (current) drug therapy: Secondary | ICD-10-CM | POA: Insufficient documentation

## 2018-03-03 DIAGNOSIS — R404 Transient alteration of awareness: Secondary | ICD-10-CM | POA: Diagnosis present

## 2018-03-03 DIAGNOSIS — Z7984 Long term (current) use of oral hypoglycemic drugs: Secondary | ICD-10-CM | POA: Diagnosis not present

## 2018-03-03 DIAGNOSIS — N390 Urinary tract infection, site not specified: Secondary | ICD-10-CM | POA: Insufficient documentation

## 2018-03-03 DIAGNOSIS — R1110 Vomiting, unspecified: Secondary | ICD-10-CM | POA: Diagnosis not present

## 2018-03-03 DIAGNOSIS — I10 Essential (primary) hypertension: Secondary | ICD-10-CM | POA: Insufficient documentation

## 2018-03-03 DIAGNOSIS — Z7982 Long term (current) use of aspirin: Secondary | ICD-10-CM | POA: Insufficient documentation

## 2018-03-03 LAB — COMPREHENSIVE METABOLIC PANEL
ALBUMIN: 3.2 g/dL — AB (ref 3.5–5.0)
ALT: 16 U/L (ref 0–44)
AST: 18 U/L (ref 15–41)
Alkaline Phosphatase: 65 U/L (ref 38–126)
Anion gap: 7 (ref 5–15)
BILIRUBIN TOTAL: 0.3 mg/dL (ref 0.3–1.2)
BUN: 14 mg/dL (ref 6–20)
CHLORIDE: 107 mmol/L (ref 98–111)
CO2: 24 mmol/L (ref 22–32)
Calcium: 9 mg/dL (ref 8.9–10.3)
Creatinine, Ser: 1.05 mg/dL — ABNORMAL HIGH (ref 0.44–1.00)
GFR calc Af Amer: >60 mL/min (ref >60)
GFR calc non Af Amer: >60 mL/min (ref >60)
GLUCOSE: 168 mg/dL — AB (ref 70–99)
Potassium: 3.7 mmol/L (ref 3.5–5.1)
Sodium: 138 mmol/L (ref 135–145)
Total Protein: 6.8 g/dL (ref 6.5–8.1)

## 2018-03-03 LAB — RAPID URINE DRUG SCREEN, HOSP PERFORMED
Amphetamines: NOT DETECTED
Barbiturates: NOT DETECTED
Benzodiazepines: POSITIVE — AB
Cocaine: NOT DETECTED
OPIATES: NOT DETECTED
TETRAHYDROCANNABINOL: POSITIVE — AB

## 2018-03-03 LAB — URINALYSIS, ROUTINE W REFLEX MICROSCOPIC
BILIRUBIN URINE: NEGATIVE
Glucose, UA: NEGATIVE mg/dL
HGB URINE DIPSTICK: NEGATIVE
Ketones, ur: NEGATIVE mg/dL
NITRITE: POSITIVE — AB
PROTEIN: 30 mg/dL — AB
Specific Gravity, Urine: 1.027 (ref 1.005–1.030)
pH: 6 (ref 5.0–8.0)

## 2018-03-03 LAB — CBC WITH DIFFERENTIAL/PLATELET
BASOS ABS: 0 10*3/uL (ref 0.0–0.1)
Band Neutrophils: 4 %
Basophils Relative: 0 %
Eosinophils Absolute: 0.1 10*3/uL (ref 0.0–0.5)
Eosinophils Relative: 1 %
HCT: 38.3 % (ref 36.0–46.0)
Hemoglobin: 10.9 g/dL — ABNORMAL LOW (ref 12.0–15.0)
Lymphocytes Relative: 11 %
Lymphs Abs: 1.1 10*3/uL (ref 0.7–4.0)
MCH: 21.5 pg — AB (ref 26.0–34.0)
MCHC: 28.5 g/dL — AB (ref 30.0–36.0)
MCV: 75.5 fL — AB (ref 80.0–100.0)
Monocytes Absolute: 0.3 10*3/uL (ref 0.1–1.0)
Monocytes Relative: 3 %
Myelocytes: 1 %
NEUTROS ABS: 8.2 10*3/uL — AB (ref 1.7–7.7)
NEUTROS PCT: 80 %
NRBC: 0 % (ref 0.0–0.2)
NRBC: 0 /100{WBCs}
PLATELETS: 271 10*3/uL (ref 150–400)
RBC: 5.07 MIL/uL (ref 3.87–5.11)
RDW: 26.8 % — ABNORMAL HIGH (ref 11.5–15.5)
WBC: 9.6 10*3/uL (ref 4.0–10.5)

## 2018-03-03 LAB — COOXEMETRY PANEL
Carboxyhemoglobin: 1.2 % (ref 0.5–1.5)
METHEMOGLOBIN: 1.7 % — AB (ref 0.0–1.5)
O2 SAT: 93.7 %
TOTAL HEMOGLOBIN: 10.1 g/dL — AB (ref 12.0–16.0)

## 2018-03-03 LAB — LIPASE, BLOOD: Lipase: 35 U/L (ref 11–51)

## 2018-03-03 LAB — SALICYLATE LEVEL: Salicylate Lvl: <7 mg/dL (ref 2.8–30.0)

## 2018-03-03 LAB — I-STAT ARTERIAL BLOOD GAS, ED
Acid-base deficit: 3 mmol/L — ABNORMAL HIGH (ref 0.0–2.0)
BICARBONATE: 22.3 mmol/L (ref 20.0–28.0)
O2 Saturation: 97 %
PO2 ART: 89 mmHg (ref 83.0–108.0)
Patient temperature: 97.6
TCO2: 23 mmol/L (ref 22–32)
pCO2 arterial: 37.7 mmHg (ref 32.0–48.0)
pH, Arterial: 7.378 (ref 7.350–7.450)

## 2018-03-03 LAB — I-STAT BETA HCG BLOOD, ED (MC, WL, AP ONLY)

## 2018-03-03 LAB — ETHANOL: Alcohol, Ethyl (B): <10 mg/dL (ref <10)

## 2018-03-03 LAB — CBG MONITORING, ED: GLUCOSE-CAPILLARY: 144 mg/dL — AB (ref 70–99)

## 2018-03-03 LAB — I-STAT CG4 LACTIC ACID, ED
Lactic Acid, Venous: 2.23 mmol/L (ref 0.5–1.9)
Lactic Acid, Venous: 1.78 mmol/L (ref 0.5–1.9)

## 2018-03-03 LAB — ACETAMINOPHEN LEVEL: Acetaminophen (Tylenol), Serum: <10 ug/mL — ABNORMAL LOW (ref 10–30)

## 2018-03-03 MED ORDER — SODIUM CHLORIDE 0.9 % IV SOLN
1.0000 g | Freq: Once | INTRAVENOUS | Status: AC
  Administered 2018-03-03: 1 g via INTRAVENOUS
  Filled 2018-03-03: qty 10

## 2018-03-03 MED ORDER — CEPHALEXIN 500 MG PO CAPS: 500.0000 mg | ORAL_CAPSULE | Freq: Two times a day (BID) | ORAL | 0 refills | Status: AC

## 2018-03-03 MED ORDER — SODIUM CHLORIDE 0.9 % IV BOLUS
1000.0000 mL | Freq: Once | INTRAVENOUS | Status: AC
  Administered 2018-03-03: 1000 mL via INTRAVENOUS

## 2018-03-03 MED ORDER — ONDANSETRON HCL 4 MG/2ML IJ SOLN
4.0000 mg | Freq: Once | INTRAMUSCULAR | Status: AC
  Administered 2018-03-03: 4 mg via INTRAVENOUS
  Filled 2018-03-03: qty 2

## 2018-03-03 NOTE — ED Notes (Signed)
Pt awakened momentarily to painful stimulus she spoke oh

## 2018-03-03 NOTE — ED Notes (Signed)
Provided patient with scrubs and copy of tests performed today. Patient left at this time with all belongings.

## 2018-03-03 NOTE — ED Notes (Signed)
Pt still sleeping...

## 2018-03-03 NOTE — ED Notes (Signed)
To ct
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
## 2018-03-03 NOTE — ED Notes (Signed)
Pt sleeping she even slept through  An   In and out cath
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
## 2018-03-03 NOTE — ED Notes (Signed)
Dr Rex Kras informed of lactic acid results 2.23

## 2018-03-05 LAB — URINE CULTURE

## 2018-03-06 ENCOUNTER — Telehealth: Payer: Self-pay | Admitting: *Deleted

## 2018-03-06 NOTE — Telephone Encounter (Signed)
Post ED Visit - Positive Culture Follow-up  Culture report reviewed by antimicrobial stewardship pharmacist:  []  Elenor Quinones, Pharm.D. []  Heide Guile, Pharm.D., BCPS AQ-ID []  Parks Neptune, Pharm.D., BCPS []  Alycia Rossetti, Pharm.D., BCPS []  [place], [place].D., BCPS, AAHIVP []  Legrand Como, Pharm.D., BCPS, AAHIVP []  Salome Arnt, PharmD, BCPS []  Johnnette Gourd, PharmD, BCPS []  Hughes Better, PharmD, BCPS []  Leeroy Cha, PharmD Tamela Gammon, PharmD  Positive urine culture Treated with Cephalexin, organism sensitive to the same and no further patient follow-up is required at this time.  Harlon Flor [facility] 03/06/2018, 10:16 AM

## 2018-04-01 ENCOUNTER — Inpatient Hospital Stay: Payer: Managed Care, Other (non HMO)

## 2018-04-01 ENCOUNTER — Inpatient Hospital Stay: Payer: Managed Care, Other (non HMO) | Attending: Hematology | Admitting: Hematology

## 2018-05-15 ENCOUNTER — Inpatient Hospital Stay (HOSPITAL_BASED_OUTPATIENT_CLINIC_OR_DEPARTMENT_OTHER): Payer: BLUE CROSS/BLUE SHIELD | Admitting: Hematology

## 2018-05-15 ENCOUNTER — Other Ambulatory Visit: Payer: Self-pay

## 2018-05-15 ENCOUNTER — Encounter: Payer: Self-pay | Admitting: Hematology

## 2018-05-15 ENCOUNTER — Inpatient Hospital Stay: Payer: BLUE CROSS/BLUE SHIELD | Attending: Hematology

## 2018-05-15 VITALS — BP 146/96 | HR 103 | Temp 98.8°F | Resp 20 | Ht 64.0 in | Wt 207.8 lb

## 2018-05-15 DIAGNOSIS — Z791 Long term (current) use of non-steroidal anti-inflammatories (NSAID): Secondary | ICD-10-CM | POA: Insufficient documentation

## 2018-05-15 DIAGNOSIS — Z79899 Other long term (current) drug therapy: Secondary | ICD-10-CM | POA: Insufficient documentation

## 2018-05-15 DIAGNOSIS — R5382 Chronic fatigue, unspecified: Secondary | ICD-10-CM | POA: Insufficient documentation

## 2018-05-15 DIAGNOSIS — Z7984 Long term (current) use of oral hypoglycemic drugs: Secondary | ICD-10-CM | POA: Insufficient documentation

## 2018-05-15 DIAGNOSIS — R112 Nausea with vomiting, unspecified: Secondary | ICD-10-CM | POA: Insufficient documentation

## 2018-05-15 DIAGNOSIS — K209 Esophagitis, unspecified without bleeding: Secondary | ICD-10-CM

## 2018-05-15 DIAGNOSIS — Z7982 Long term (current) use of aspirin: Secondary | ICD-10-CM | POA: Diagnosis not present

## 2018-05-15 DIAGNOSIS — D500 Iron deficiency anemia secondary to blood loss (chronic): Secondary | ICD-10-CM | POA: Diagnosis present

## 2018-05-15 DIAGNOSIS — R5383 Other fatigue: Secondary | ICD-10-CM | POA: Insufficient documentation

## 2018-05-15 DIAGNOSIS — D509 Iron deficiency anemia, unspecified: Secondary | ICD-10-CM

## 2018-05-15 LAB — CMP (CANCER CENTER ONLY)
ALK PHOS: 81 U/L (ref 38–126)
ALT: 11 U/L (ref 0–44)
AST: 11 U/L — ABNORMAL LOW (ref 15–41)
Albumin: 4.5 g/dL (ref 3.5–5.0)
Anion gap: 7 (ref 5–15)
BUN: 12 mg/dL (ref 6–20)
CALCIUM: 9.7 mg/dL (ref 8.9–10.3)
CO2: 30 mmol/L (ref 22–32)
Chloride: 101 mmol/L (ref 98–111)
Creatinine: 0.97 mg/dL (ref 0.44–1.00)
GFR, Est AFR Am: >60 mL/min (ref >60)
GFR, Estimated: >60 mL/min (ref >60)
Glucose, Bld: 83 mg/dL (ref 70–99)
Potassium: 4.6 mmol/L (ref 3.5–5.1)
Sodium: 138 mmol/L (ref 135–145)
Total Bilirubin: 0.2 mg/dL — ABNORMAL LOW (ref 0.3–1.2)
Total Protein: 7.5 g/dL (ref 6.5–8.1)

## 2018-05-15 LAB — CBC WITH DIFFERENTIAL (CANCER CENTER ONLY)
ABS IMMATURE GRANULOCYTES: 0.03 10*3/uL (ref 0.00–0.07)
BASOS ABS: 0 10*3/uL (ref 0.0–0.1)
Basophils Relative: 1 %
EOS PCT: 1 %
Eosinophils Absolute: 0 10*3/uL (ref 0.0–0.5)
HCT: 42 % (ref 36.0–46.0)
HEMOGLOBIN: 12.8 g/dL (ref 12.0–15.0)
Immature Granulocytes: 0 %
LYMPHS PCT: 34 %
Lymphs Abs: 2.7 10*3/uL (ref 0.7–4.0)
MCH: 25.1 pg — ABNORMAL LOW (ref 26.0–34.0)
MCHC: 30.5 g/dL (ref 30.0–36.0)
MCV: 82.4 fL (ref 80.0–100.0)
Monocytes Absolute: 0.7 10*3/uL (ref 0.1–1.0)
Monocytes Relative: 8 %
NEUTROS PCT: 56 %
NRBC: 0 % (ref 0.0–0.2)
Neutro Abs: 4.6 10*3/uL (ref 1.7–7.7)
Platelet Count: 299 10*3/uL (ref 150–400)
RBC: 5.1 MIL/uL (ref 3.87–5.11)
RDW: 17.6 % — ABNORMAL HIGH (ref 11.5–15.5)
WBC Count: 8.1 10*3/uL (ref 4.0–10.5)

## 2018-05-15 NOTE — Progress Notes (Signed)
[place] OFFICE PROGRESS NOTE  Patient Care Team: Patient, No Pcp Per as PCP - General (General Practice) Sonnie Alamo, MD as Referring Physician (Gastroenterology) Amil Amen, MD as Referring Physician (Neurology) Alton Revere, MD as Referring Physician (Endocrinology) Corena Pilgrim, MD as Consulting Physician (Psychiatry)  HEME/ONC OVERVIEW: 1. 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   TREATMENT REGIMEN:  PRN IV iron, most recently in 01/2018  ASSESSMENT & PLAN:   Iron deficiency anemia -Secondary to chronic blood loss from esophagitis -Patient received IV iron infusion in 01/2018; she tolerated well without any significant side effects -She currently denies any symptoms of recurrent GI bleeding, such as hematemesis, hematochezia, or melena -Hgb 12.8 today, improving since the last visit; iron profile pending -Given the normalization of hemoglobin, there is no indication for IV iron infusion today -No oral iron supplement due to GI side effects -I counseled the patient on importance of maintaining a balanced diet, including red meat, which contains high iron content  Hx of esophagitis -Patient is scheduled for GI evaluation in early 05/2018 -She currently denies any symptoms of recurrent GI bleeding -I encouraged patient to follow-up with her gastroenterologist for further evaluation, including EGD and colonoscopy as needed  Chronic fatigue -Patient reports persistent fatigue despite normalization of hemoglobin  -I discussed with the patient regarding eating a balanced diet and increasing exercise, which may help improve her energy level -I also encouraged patient to follow-up with her PCP regarding further management of chronic fatigue  Orders Placed This Encounter  Procedures  . CBC with Differential (Cancer Center Only)    Standing Status:   Future    Standing Expiration Date:   06/19/2019  . Save Smear  (SSMR)    Standing Status:   Future    Standing Expiration Date:   05/16/2019  . Ferritin    Standing Status:   Future    Standing Expiration Date:   06/19/2019  . Iron and TIBC    Standing Status:   Future    Standing Expiration Date:   06/19/2019    All questions were answered. The patient knows to call the clinic with any problems, questions or concerns. No barriers to learning was detected.  A total of more than 25 minutes were spent face-to-face with the patient during this encounter and over half of that time was spent on counseling and coordination of care as outlined above.   Return in 6 months for labs and clinic follow-up.  Tish Men, MD 05/15/2018 3:53 PM  CHIEF COMPLAINT: "I am still tired"  INTERVAL HISTORY: Diane [name] returns to clinic for follow-up of iron deficiency anemia.  The patient reports that since her IV iron infusion, her craving for ice had resolved.  She has chronic migraines, associated with nausea and vomiting, but she denies any recurrent hematemesis, abdominal [name], diarrhea, hematochezia or melena.  She continues to have persistent fatigue, and reports that she has been seen by several specialists in the past without a clear diagnosis.  She denies any other complaint today.  REVIEW OF SYSTEMS:   Constitutional: ( - ) fevers, ( - )  chills , ( - ) night sweats Eyes: ( - ) blurriness of vision, ( - ) double vision, ( - ) watery eyes Ears, nose, mouth, throat, and face: ( - ) mucositis, ( - ) sore throat Respiratory: ( - ) cough, ( - ) dyspnea, ( - ) wheezes Cardiovascular: ( - ) palpitation, ( - )  chest discomfort, ( - ) lower extremity swelling Gastrointestinal:  ( + ) nausea, ( - ) heartburn, ( - ) change in bowel habits Skin: ( - ) abnormal skin rashes Lymphatics: ( - ) new lymphadenopathy, ( - ) easy bruising Neurological: ( - ) numbness, ( - ) tingling, ( - ) new weaknesses Behavioral/Psych: ( - ) mood change, ( - ) new changes  All other systems  were reviewed with the patient and are negative.  I have reviewed the past medical history, past surgical history, social history and family history with the patient and they are unchanged from previous note.  ALLERGIES:  is allergic to olanzapine; prednisone; kiwi extract; lisinopril; relpax [eletriptan hydrobromide]; topiramate; and eletriptan.  MEDICATIONS:  Current Outpatient Medications  Medication Sig Dispense Refill  . amitriptyline (ELAVIL) 10 MG tablet Take 20 mg by mouth at bedtime as needed.   11  . aspirin 325 MG tablet Take 325 mg by mouth daily.    Marland Kitchen buPROPion (WELLBUTRIN XL) 150 MG 24 hr tablet Take 1 tablet (150 mg total) by mouth daily. 90 tablet 1  . clonazePAM (KLONOPIN) 1 MG tablet TAKE 1 TABLET BY MOUTH TWICE DAILY AS NEEDED FOR ANXIETY (Patient taking differently: TAKE 1 TABLET BY MOUTH TWICE DAILY) 60 tablet 0  . co-enzyme Q-10 50 MG capsule Take by mouth.    . DEXILANT 60 MG capsule Take 60 mg by mouth daily.   12  . Doxepin HCl 3 MG TABS Take 1 tablet (3 mg total) by mouth daily. 30 tablet 2  . esomeprazole (NEXIUM) 40 MG capsule Take 40 mg by mouth daily.     . Gabapentin, Once-Daily, (GRALISE) 600 MG TABS Take 1,800 mg by mouth at bedtime.     Marland Kitchen HYDROcodone-acetaminophen (NORCO/VICODIN) 5-325 MG tablet Take 1-2 tablets by mouth every 6 (six) hours as needed. 12 tablet 0  . hydrOXYzine (VISTARIL) 25 MG capsule TAKE 2 CAPSULES BY MOUTH AT BEDTIME AS NEEDED 60 capsule 2  . L-Methylfolate-Algae (DEPLIN 15) 15-90.314 MG CAPS Take 1 capsule by mouth daily. 90 capsule 3  . levothyroxine (SYNTHROID) 50 MCG tablet Take 50 mcg by mouth daily before breakfast.    . meloxicam (MOBIC) 15 MG tablet One tab PO qAM with breakfast for 2 weeks, then daily prn [name]. (Patient taking differently: Take 15 mg by mouth daily as needed for [name]. ) 30 tablet 3  . metFORMIN (GLUCOPHAGE) 500 MG tablet Take 500 mg by mouth 2 (two) times daily.  1  . Methylfol-Algae-B12-Acetylcyst (CEREFOLIN  NAC) 6-90.314-2-600 MG TABS TAKE ONE TABLET BY MOUTH ONE TIME DAILY 90 tablet 3  . naproxen (NAPROSYN) 500 MG tablet Take 1 tablet (500 mg total) by mouth 2 (two) times daily. 20 tablet 0  . naratriptan (AMERGE) 2.5 MG tablet Take 2.5 mg by mouth See admin instructions. Take 2.5 mg by mouth twice daily for 2 days then take 2.5 mg by mouth daily for 2 days and then take 1.25 mg by mouth daily for 2 days then off  0  . OMEGA-3 FATTY ACIDS PO Take by mouth.    . ondansetron (ZOFRAN ODT) 4 MG disintegrating tablet Take 1 tablet (4 mg total) by mouth every 8 (eight) hours as needed for nausea or vomiting. 20 tablet 0  . rizatriptan (MAXALT-MLT) 10 MG disintegrating tablet Take 10 mg by mouth as needed for migraine. May repeat in 2 hours if needed - max of 2 doses in 24 hours    . sulfamethoxazole-trimethoprim (  BACTRIM DS,SEPTRA DS) 800-160 MG tablet Take 1 tablet by mouth every 12 (twelve) hours.  0  . topiramate (TOPAMAX) 25 MG tablet TAKE 1 TABLET BY MOUTH TWICE DAILY 60 tablet 0  . traMADol (ULTRAM) 50 MG tablet Take 1 tablet by mouth every 4 to 6 hours as needed for [name]  0  . WELLBUTRIN XL 300 MG 24 hr tablet Take 300 mg by mouth daily.  1  . ZOMIG 5 MG nasal solution Place 1 spray into the nose daily as needed for migraine. May repeat in 2 hours if needed. Max of 2 doses in 24 hours  0   No current facility-administered medications for this visit.     PHYSICAL EXAMINATION: ECOG PERFORMANCE STATUS: 1 - Symptomatic but completely ambulatory  Today's Vitals   05/15/18 1533 05/15/18 1538  BP:  (!) 146/96  Pulse:  (!) 103  Resp:  20  Temp:  98.8 F (37.1 C)  TempSrc:  Oral  SpO2:  100%  Weight:  207 lb 12 oz (94.2 kg)  Height:  5\' 4"  (1.626 m)  PainSc: 0-No [name] 0-No [name]   Body mass index is 35.66 kg/m.  Filed Weights   05/15/18 1538  Weight: 207 lb 12 oz (94.2 kg)    GENERAL: alert, no distress and comfortable SKIN: skin color, texture, turgor are normal, no rashes or significant  lesions EYES: conjunctiva are pink and non-injected, sclera clear OROPHARYNX: no exudate, no erythema; lips, buccal mucosa, and tongue normal  NECK: supple, non-tender LUNGS: clear to auscultation with normal breathing effort HEART: regular rate & rhythm and no murmurs and no lower extremity edema ABDOMEN: soft, non-tender, non-distended, normal bowel sounds Musculoskeletal: no cyanosis of digits and no clubbing  PSYCH: alert & oriented x 3, fluent speech NEURO: no focal motor/sensory deficits  LABORATORY DATA:  I have reviewed the data as listed    Component Value Date/Time   NA 138 03/03/2018 0224   NA 140 06/19/2017 1656   K 3.7 03/03/2018 0224   CL 107 03/03/2018 0224   CO2 24 03/03/2018 0224   GLUCOSE 168 (H) 03/03/2018 0224   BUN 14 03/03/2018 0224   BUN 10 06/19/2017 1656   CREATININE 1.05 (H) 03/03/2018 0224   CREATININE 1.00 01/30/2018 1411   CREATININE 0.88 01/31/2016 1711   CALCIUM 9.0 03/03/2018 0224   PROT 6.8 03/03/2018 0224   ALBUMIN 3.2 (L) 03/03/2018 0224   AST 18 03/03/2018 0224   AST 21 01/30/2018 1411   ALT 16 03/03/2018 0224   ALT 20 01/30/2018 1411   ALKPHOS 65 03/03/2018 0224   BILITOT 0.3 03/03/2018 0224   BILITOT 0.4 01/30/2018 1411   GFRNONAA >60 03/03/2018 0224   GFRNONAA 81 01/31/2016 1711   GFRAA >60 03/03/2018 0224   GFRAA >89 01/31/2016 1711    No results found for: SPEP, UPEP  Lab Results  Component Value Date   WBC 8.1 05/15/2018   NEUTROABS 4.6 05/15/2018   HGB 12.8 05/15/2018   HCT 42.0 05/15/2018   MCV 82.4 05/15/2018   PLT 299 05/15/2018      Chemistry      Component Value Date/Time   NA 138 03/03/2018 0224   NA 140 06/19/2017 1656   K 3.7 03/03/2018 0224   CL 107 03/03/2018 0224   CO2 24 03/03/2018 0224   BUN 14 03/03/2018 0224   BUN 10 06/19/2017 1656   CREATININE 1.05 (H) 03/03/2018 0224   CREATININE 1.00 01/30/2018 1411   CREATININE 0.88 01/31/2016  1711   GLU 87 06/12/2016      Component Value Date/Time    CALCIUM 9.0 03/03/2018 0224   ALKPHOS 65 03/03/2018 0224   AST 18 03/03/2018 0224   AST 21 01/30/2018 1411   ALT 16 03/03/2018 0224   ALT 20 01/30/2018 1411   BILITOT 0.3 03/03/2018 0224   BILITOT 0.4 01/30/2018 1411

## 2018-05-16 LAB — FERRITIN: Ferritin: 8 ng/mL — ABNORMAL LOW (ref 11–307)

## 2018-05-16 LAB — IRON AND TIBC
IRON: 83 ug/dL (ref 41–142)
SATURATION RATIOS: 18 % — AB (ref 21–57)
TIBC: 460 ug/dL — ABNORMAL HIGH (ref 236–444)
UIBC: 377 ug/dL (ref 120–384)

## 2018-05-31 ENCOUNTER — Other Ambulatory Visit: Payer: Self-pay | Admitting: Physician Assistant

## 2018-07-29 ENCOUNTER — Other Ambulatory Visit: Payer: Self-pay

## 2018-07-29 DIAGNOSIS — D500 Iron deficiency anemia secondary to blood loss (chronic): Secondary | ICD-10-CM

## 2018-08-05 ENCOUNTER — Telehealth: Payer: Self-pay | Admitting: Physician Assistant

## 2018-08-05 NOTE — Telephone Encounter (Signed)
Smart phone/mychart/consent obtained/pre reg complete/dc/04.14.20

## 2018-08-06 ENCOUNTER — Telehealth (INDEPENDENT_AMBULATORY_CARE_PROVIDER_SITE_OTHER): Payer: BLUE CROSS/BLUE SHIELD | Admitting: Physician Assistant

## 2018-08-06 ENCOUNTER — Encounter: Payer: Self-pay | Admitting: Physician Assistant

## 2018-08-06 ENCOUNTER — Telehealth: Payer: Self-pay

## 2018-08-06 DIAGNOSIS — R05 Cough: Secondary | ICD-10-CM

## 2018-08-06 DIAGNOSIS — I2699 Other pulmonary embolism without acute cor pulmonale: Secondary | ICD-10-CM

## 2018-08-06 DIAGNOSIS — R059 Cough, unspecified: Secondary | ICD-10-CM

## 2018-08-06 DIAGNOSIS — R0789 Other chest pain: Secondary | ICD-10-CM

## 2018-08-06 DIAGNOSIS — E039 Hypothyroidism, unspecified: Secondary | ICD-10-CM

## 2018-08-06 DIAGNOSIS — R5383 Other fatigue: Secondary | ICD-10-CM

## 2018-08-06 DIAGNOSIS — I10 Essential (primary) hypertension: Secondary | ICD-10-CM

## 2018-08-06 NOTE — Patient Instructions (Signed)
Medication Instructions:   Your physician recommends that you continue on your current medications as directed. Please refer to the Current Medication list given to you today.  If you need a refill on your cardiac medications before your next appointment, please call your pharmacy.   Lab work: NONE ordered at this time of appointment   If you have labs (blood work) drawn today and your tests are completely normal, you will receive your results only by: Marland Kitchen MyChart Message (if you have MyChart) OR . A paper copy in the mail If you have any lab test that is abnormal or we need to change your treatment, we will call you to review the results.  Testing/Procedures: NONE ordered at this time of appointment   Follow-Up: At [facility], you and your health needs are our priority.  As part of our continuing mission to provide you with exceptional heart care, we have created designated Provider Care Teams.  These Care Teams include your primary Cardiologist (physician) and Advanced Practice Providers (APPs -  Physician Assistants and Nurse Practitioners) who all work together to provide you with the care you need, when you need it. You will need a follow up appointment in 1 months.  Please call our office 2 months in advance to schedule this appointment.  You may see Kirk Ruths, MD or one of the following Advanced Practice Providers on your designated Care Team:   Kerin Ransom, PA-C Roby Lofts, [place] . Sande Rives, PA-C  Any Other Special Instructions Will Be Listed Below (If Applicable).

## 2018-08-06 NOTE — Telephone Encounter (Signed)
Left message for the patient
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
## 2018-08-07 ENCOUNTER — Emergency Department (HOSPITAL_BASED_OUTPATIENT_CLINIC_OR_DEPARTMENT_OTHER)
Admission: EM | Admit: 2018-08-07 | Discharge: 2018-08-07 | Disposition: A | Discharge status: Home / Self Care | Payer: BLUE CROSS/BLUE SHIELD | Attending: Emergency Medicine | Admitting: Emergency Medicine

## 2018-08-07 ENCOUNTER — Encounter (HOSPITAL_BASED_OUTPATIENT_CLINIC_OR_DEPARTMENT_OTHER): Payer: Self-pay | Admitting: *Deleted

## 2018-08-07 ENCOUNTER — Other Ambulatory Visit: Payer: Self-pay

## 2018-08-07 ENCOUNTER — Emergency Department (HOSPITAL_COMMUNITY)
Admission: EM | Admit: 2018-08-07 | Discharge: 2018-08-07 | Discharge status: Absent without leave | Payer: BLUE CROSS/BLUE SHIELD | Attending: Emergency Medicine | Admitting: Emergency Medicine

## 2018-08-07 ENCOUNTER — Emergency Department (HOSPITAL_BASED_OUTPATIENT_CLINIC_OR_DEPARTMENT_OTHER): Payer: BLUE CROSS/BLUE SHIELD

## 2018-08-07 DIAGNOSIS — R0602 Shortness of breath: Secondary | ICD-10-CM | POA: Insufficient documentation

## 2018-08-07 DIAGNOSIS — R0789 Other chest pain: Secondary | ICD-10-CM | POA: Diagnosis not present

## 2018-08-07 DIAGNOSIS — Z7982 Long term (current) use of aspirin: Secondary | ICD-10-CM | POA: Diagnosis not present

## 2018-08-07 DIAGNOSIS — Z7984 Long term (current) use of oral hypoglycemic drugs: Secondary | ICD-10-CM | POA: Insufficient documentation

## 2018-08-07 DIAGNOSIS — R059 Cough, unspecified: Secondary | ICD-10-CM

## 2018-08-07 DIAGNOSIS — E039 Hypothyroidism, unspecified: Secondary | ICD-10-CM | POA: Diagnosis not present

## 2018-08-07 DIAGNOSIS — Z79899 Other long term (current) drug therapy: Secondary | ICD-10-CM | POA: Insufficient documentation

## 2018-08-07 DIAGNOSIS — R05 Cough: Secondary | ICD-10-CM

## 2018-08-07 DIAGNOSIS — I10 Essential (primary) hypertension: Secondary | ICD-10-CM | POA: Insufficient documentation

## 2018-08-07 LAB — CBC WITH DIFFERENTIAL/PLATELET
Abs Immature Granulocytes: 0.04 10*3/uL (ref 0.00–0.07)
Basophils Absolute: 0.1 10*3/uL (ref 0.0–0.1)
Basophils Relative: 0 %
Eosinophils Absolute: 0.1 10*3/uL (ref 0.0–0.5)
Eosinophils Relative: 1 %
HCT: 40.2 % (ref 36.0–46.0)
Hemoglobin: 12.1 g/dL (ref 12.0–15.0)
Immature Granulocytes: 0 %
Lymphocytes Relative: 38 %
Lymphs Abs: 4.5 10*3/uL — ABNORMAL HIGH (ref 0.7–4.0)
MCH: 25.5 pg — ABNORMAL LOW (ref 26.0–34.0)
MCHC: 30.1 g/dL (ref 30.0–36.0)
MCV: 84.8 fL (ref 80.0–100.0)
Monocytes Absolute: 0.9 10*3/uL (ref 0.1–1.0)
Monocytes Relative: 8 %
Neutro Abs: 6.4 10*3/uL (ref 1.7–7.7)
Neutrophils Relative %: 53 %
Platelets: 331 10*3/uL (ref 150–400)
RBC: 4.74 MIL/uL (ref 3.87–5.11)
RDW: 14.4 % (ref 11.5–15.5)
WBC: 11.9 10*3/uL — ABNORMAL HIGH (ref 4.0–10.5)
nRBC: 0 % (ref 0.0–0.2)

## 2018-08-07 LAB — BASIC METABOLIC PANEL
Anion gap: 7 (ref 5–15)
BUN: 9 mg/dL (ref 6–20)
CO2: 28 mmol/L (ref 22–32)
Calcium: 9.5 mg/dL (ref 8.9–10.3)
Chloride: 102 mmol/L (ref 98–111)
Creatinine, Ser: 0.91 mg/dL (ref 0.44–1.00)
GFR calc Af Amer: >60 mL/min (ref >60)
GFR calc non Af Amer: >60 mL/min (ref >60)
Glucose, Bld: 89 mg/dL (ref 70–99)
Potassium: 3.6 mmol/L (ref 3.5–5.1)
Sodium: 137 mmol/L (ref 135–145)

## 2018-08-07 LAB — TROPONIN I: Troponin I: <0.03 ng/mL (ref <0.03)

## 2018-08-07 LAB — D-DIMER, QUANTITATIVE: D-Dimer, Quant: 0.31 ug/mL-FEU (ref 0.00–0.50)

## 2018-08-07 LAB — PREGNANCY, URINE: Preg Test, Ur: NEGATIVE

## 2018-08-07 NOTE — ED Triage Notes (Signed)
States she has been sick for 3 months. Cough that was treated with antibiotics. She was tested for covid 19 2 weeks ago and the results were  negative.

## 2018-08-07 NOTE — Discharge Instructions (Signed)
As we discussed your heart enzyme, blood clot number, and labs and chest x-ray are all very reassuring today.  Please follow-up with your primary care doctor and your cardiologist.  I suspect that your chest pain may be at least partially caused by costochondritis which is the medical term for irritation of your chest wall.  I would recommend that you take Tylenol for this.
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
## 2018-08-07 NOTE — ED Notes (Signed)
PA C in with Pt. At this time

## 2018-10-08 NOTE — Telephone Encounter (Signed)
error 

## 2018-10-28 ENCOUNTER — Inpatient Hospital Stay: Payer: BLUE CROSS/BLUE SHIELD | Admitting: Hematology

## 2018-10-28 ENCOUNTER — Inpatient Hospital Stay: Payer: BLUE CROSS/BLUE SHIELD

## 2018-11-03 ENCOUNTER — Telehealth: Payer: Self-pay | Admitting: *Deleted

## 2018-11-03 NOTE — Telephone Encounter (Signed)
Call received from patient's husband wanting to know if his insurance has been contacted regarding patient's CT scan.  Instructed pt.'s husband and pt that this office did not order a CT scan for patient and that they should contact the office who ordered it.  Pt states that she believes the pain clinic has ordered the CT and will call them now.  Pt appreciative of assistance.

## 2018-11-04 ENCOUNTER — Encounter: Payer: Self-pay | Admitting: Hematology

## 2018-11-04 ENCOUNTER — Inpatient Hospital Stay (HOSPITAL_BASED_OUTPATIENT_CLINIC_OR_DEPARTMENT_OTHER): Payer: BLUE CROSS/BLUE SHIELD | Admitting: Hematology

## 2018-11-04 ENCOUNTER — Inpatient Hospital Stay: Payer: BLUE CROSS/BLUE SHIELD | Attending: Hematology

## 2018-11-04 ENCOUNTER — Other Ambulatory Visit: Payer: Self-pay

## 2018-11-04 VITALS — BP 168/110 | HR 116 | Temp 97.9°F | Resp 18 | Ht 64.0 in | Wt 210.0 lb

## 2018-11-04 DIAGNOSIS — R5383 Other fatigue: Secondary | ICD-10-CM

## 2018-11-04 DIAGNOSIS — K209 Esophagitis, unspecified without bleeding: Secondary | ICD-10-CM

## 2018-11-04 DIAGNOSIS — Z7982 Long term (current) use of aspirin: Secondary | ICD-10-CM

## 2018-11-04 DIAGNOSIS — D500 Iron deficiency anemia secondary to blood loss (chronic): Secondary | ICD-10-CM | POA: Diagnosis present

## 2018-11-04 DIAGNOSIS — Z7984 Long term (current) use of oral hypoglycemic drugs: Secondary | ICD-10-CM | POA: Insufficient documentation

## 2018-11-04 DIAGNOSIS — Z79899 Other long term (current) drug therapy: Secondary | ICD-10-CM | POA: Diagnosis not present

## 2018-11-04 NOTE — Progress Notes (Signed)
[place] OFFICE PROGRESS NOTE  Patient Care Team: [name], DO as PCP - General (Family Medicine) Stanford Breed Denice Bors, MD as PCP - Cardiology (Cardiology) Sonnie Alamo, MD as Referring Physician (Gastroenterology) Amil Amen, MD as Referring Physician (Neurology) Alton Revere, MD as Referring Physician (Endocrinology) Corena Pilgrim, MD as Consulting Physician (Psychiatry)  HEME/ONC OVERVIEW: 1. 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  -The patient is symptomatic from anemia and the iron level is critically low; as such, oral supplement is not sufficient to replete iron storage quickly and she will need IV iron to higher levels of iron faster for adequate hematopoesis.  -Some of the side-effects to be expected including risks of infusion reactions, phlebitis, headaches, nausea and fatigue.   -The patient is willing to proceed.  -Goal is to keep ferritin level greater than 50. -We will plan for 2 doses, the 1st dose this week pending infusion availability   Hx of esophagitis -She currently denies any symptoms of recurrent GI bleeding but continues to have reflux symptoms -She was scheduled to see GI in 05/2018 but did not keep the appt -I have referred the patient to GI at [facility] for further evaluation, including EGD and colonoscopy as needed  Orders Placed This Encounter  Procedures  . CBC with Differential (Cancer Center Only)    Standing Status:   Future     Standing Expiration Date:   12/09/2019  . Save Smear (SSMR)    Standing Status:   Future    Standing Expiration Date:   11/04/2019  . Ferritin    Standing Status:   Future    Standing Expiration Date:   12/09/2019  . Iron and TIBC    Standing Status:   Future    Standing Expiration Date:   12/09/2019  . Ambulatory referral to Gastroenterology    Referral Priority:   Routine    Referral Type:   Consultation    Referral Reason:   Specialty Services Required    Number of Visits Requested:   1   All questions were answered. The patient knows to call the [name] with any problems, questions or concerns. No barriers to learning was detected.  A total of more than 25 minutes were spent face-to-face with the patient during this encounter and over half of that time was spent on counseling and coordination of care as outlined above.  Return in 3 months for labs and [name] follow-up.   Tish Men, MD 11/04/2018 4:17 PM  CHIEF COMPLAINT: "I am a lot more tired"  INTERVAL HISTORY: [name] for follow-up of iron deficiency anemia due to chronic blood loss from esophagitis.  Patient reports that she recently went to the ER for shortness of breath, and was diagnosed with pneumonia.  She was tested negative for COVID.  She was scheduled to see a gastroenterologist in early 2020, but never did make that appointment.  She continues to have persistent reflux symptoms, but denies any abdominal pain, nausea, vomiting, hematemesis, hematochezia, or melena.  She reports progressive fatigue and her recent hemoglobin was noted to be low at 10.7 in 09/2018.  She denies any other complaint today.  REVIEW OF SYSTEMS:   Constitutional: ( - ) fevers, ( - )  chills , ( - ) night sweats Eyes: ( - ) blurriness of vision, ( - ) double vision, ( - ) watery eyes Ears, nose, mouth, throat, and face: ( - ) mucositis, ( - ) sore throat Respiratory: ( - ) cough, ( - ) dyspnea, ( - )  wheezes Cardiovascular: ( - ) palpitation, ( - ) chest discomfort, ( - ) lower extremity swelling Gastrointestinal:  ( - ) nausea, ( + ) heartburn, ( - ) change in bowel habits Skin: ( - ) abnormal skin rashes Lymphatics: ( - ) new lymphadenopathy, ( - ) easy bruising Neurological: ( - ) numbness, ( - ) tingling, ( - ) new weaknesses Behavioral/Psych: ( - ) mood change, ( - ) new changes  All other systems were reviewed with the patient and are negative.  I have reviewed the past medical history, past surgical history, social history and family history with the patient and they are unchanged from previous note.  ALLERGIES:  is allergic to topiramate; lisinopril; olanzapine; prednisone; relpax [eletriptan hydrobromide]; eletriptan; and kiwi extract.  MEDICATIONS:  Current Outpatient Medications  Medication Sig Dispense Refill  . amitriptyline (ELAVIL) 10 MG tablet Take 20 mg by mouth at bedtime as needed.   11  . aspirin 325 MG tablet Take 325 mg by mouth daily.    Marland Kitchen buPROPion (WELLBUTRIN XL) 150 MG 24 hr tablet Take 1 tablet (150 mg total) by mouth daily. 90 tablet 1  . clonazePAM (KLONOPIN) 1 MG tablet TAKE 1 TABLET BY MOUTH TWICE DAILY AS NEEDED FOR ANXIETY (Patient taking differently: TAKE 1 TABLET BY MOUTH TWICE DAILY) 60 tablet 0  . co-enzyme Q-10 50 MG capsule Take by mouth.    . DEXILANT 60 MG capsule Take 60 mg by mouth daily.   12  . Doxepin HCl 3 MG TABS Take 1 tablet (3 mg total) by mouth daily. 30 tablet 2  . esomeprazole (NEXIUM) 40 MG capsule Take 40 mg by mouth daily.     . Gabapentin, Once-Daily, (GRALISE) 600 MG TABS Take 1,800 mg by mouth at bedtime.     Marland Kitchen HYDROcodone-acetaminophen (NORCO/VICODIN) 5-325 MG tablet Take 1-2 tablets by mouth every 6 (six) hours as needed. 12 tablet 0  . hydrOXYzine (VISTARIL) 25 MG capsule TAKE 2 CAPSULES BY MOUTH AT BEDTIME AS NEEDED 60 capsule 2  . L-Methylfolate-Algae (DEPLIN 15) 15-90.314 MG CAPS Take 1 capsule by mouth daily. 90 capsule  3  . levothyroxine (SYNTHROID) 50 MCG tablet Take 50 mcg by mouth daily before breakfast.    . meloxicam (MOBIC) 15 MG tablet One tab PO qAM with breakfast for 2 weeks, then daily prn pain. (Patient taking differently: Take 15 mg by mouth daily as needed for pain. ) 30 tablet 3  . metFORMIN (GLUCOPHAGE) 500 MG tablet Take 500 mg by mouth 2 (two) times daily.  1  . Methylfol-Algae-B12-Acetylcyst (CEREFOLIN NAC) 6-90.314-2-600 MG TABS TAKE ONE TABLET BY MOUTH ONE TIME DAILY 90 tablet 3  . naproxen (NAPROSYN) 500 MG tablet Take 1 tablet (500 mg total) by mouth 2 (two) times daily. 20 tablet 0  . naratriptan (AMERGE) 2.5 MG tablet Take 2.5 mg by mouth See admin instructions. Take 2.5 mg by mouth twice daily for 2 days then take 2.5 mg  by mouth daily for 2 days and then take 1.25 mg by mouth daily for 2 days then off  0  . OMEGA-3 FATTY ACIDS PO Take by mouth.    . ondansetron (ZOFRAN ODT) 4 MG disintegrating tablet Take 1 tablet (4 mg total) by mouth every 8 (eight) hours as needed for nausea or vomiting. 20 tablet 0  . rizatriptan (MAXALT-MLT) 10 MG disintegrating tablet Take 10 mg by mouth as needed for migraine. May repeat in 2 hours if needed - max of 2 doses in 24 hours    . sulfamethoxazole-trimethoprim (BACTRIM DS,SEPTRA DS) 800-160 MG tablet Take 1 tablet by mouth every 12 (twelve) hours.  0  . topiramate (TOPAMAX) 25 MG tablet TAKE 1 TABLET BY MOUTH TWICE DAILY 60 tablet 0  . traMADol (ULTRAM) 50 MG tablet Take 1 tablet by mouth every 4 to 6 hours as needed for pain  0  . verapamil (CALAN) 40 MG tablet TAKE 1 TAB BY MOUTH TWICE DAILY. MAY TAKE ADDITIONAL TAB AS NEEDED FOR PALPITATIONS (HR >60, BP>100) 270 tablet 1  . WELLBUTRIN XL 300 MG 24 hr tablet Take 300 mg by mouth daily.  1  . ZOMIG 5 MG nasal solution Place 1 spray into the nose daily as needed for migraine. May repeat in 2 hours if needed. Max of 2 doses in 24 hours  0   No current facility-administered medications for this visit.      PHYSICAL EXAMINATION: ECOG PERFORMANCE STATUS: 1 - Symptomatic but completely ambulatory  Today's Vitals   11/04/18 1608 11/04/18 1611  BP: (!) 168/110   Pulse: (!) 116   Resp: 18   Temp: 97.9 F (36.6 C)   TempSrc: Temporal   SpO2: 100%   Weight: 210 lb (95.3 kg)   Height: 5\' 4"  (1.626 m)   PainSc: 0-No pain 0-No pain   Body mass index is 36.05 kg/m.  Filed Weights   11/04/18 1608  Weight: 210 lb (95.3 kg)    GENERAL: alert, no distress and comfortable, obese  SKIN: skin color, texture, turgor are normal, no rashes or significant lesions EYES: conjunctiva are pink and non-injected, sclera clear OROPHARYNX: no exudate, no erythema; lips, buccal mucosa, and tongue normal  NECK: supple LUNGS: clear to auscultation with normal breathing effort HEART: regular rate & rhythm and no murmurs and no lower extremity edema ABDOMEN: soft, non-tender, non-distended, normal bowel sounds Musculoskeletal: no cyanosis of digits and no clubbing  PSYCH: alert & oriented x 3, fluent speech  LABORATORY DATA:  I have reviewed the data as listed    Component Value Date/Time   NA 137 08/07/2018 1716   NA 140 06/19/2017 1656   K 3.6 08/07/2018 1716   CL 102 08/07/2018 1716   CO2 28 08/07/2018 1716   GLUCOSE 89 08/07/2018 1716   BUN 9 08/07/2018 1716   BUN 10 06/19/2017 1656   CREATININE 0.91 08/07/2018 1716   CREATININE 0.97 05/15/2018 1518   CREATININE 0.88 01/31/2016 1711   CALCIUM 9.5 08/07/2018 1716   PROT 7.5 05/15/2018 1518   ALBUMIN 4.5 05/15/2018 1518   AST 11 (L) 05/15/2018 1518   ALT 11 05/15/2018 1518   ALKPHOS 81 05/15/2018 1518   BILITOT 0.2 (L) 05/15/2018 1518   GFRNONAA >60 08/07/2018 1716   GFRNONAA >60 05/15/2018 1518   GFRNONAA 81 01/31/2016 1711   GFRAA >60 08/07/2018 1716   GFRAA >60 05/15/2018 1518   GFRAA >89 01/31/2016 1711    No results found for: SPEP,  UPEP  Lab Results  Component Value Date   WBC 11.9 (H) 08/07/2018   NEUTROABS 6.4  08/07/2018   HGB 12.1 08/07/2018   HCT 40.2 08/07/2018   MCV 84.8 08/07/2018   PLT 331 08/07/2018      Chemistry      Component Value Date/Time   NA 137 08/07/2018 1716   NA 140 06/19/2017 1656   K 3.6 08/07/2018 1716   CL 102 08/07/2018 1716   CO2 28 08/07/2018 1716   BUN 9 08/07/2018 1716   BUN 10 06/19/2017 1656   CREATININE 0.91 08/07/2018 1716   CREATININE 0.97 05/15/2018 1518   CREATININE 0.88 01/31/2016 1711   GLU 87 06/12/2016      Component Value Date/Time   CALCIUM 9.5 08/07/2018 1716   ALKPHOS 81 05/15/2018 1518   AST 11 (L) 05/15/2018 1518   ALT 11 05/15/2018 1518   BILITOT 0.2 (L) 05/15/2018 1518       RADIOGRAPHIC STUDIES: I have personally reviewed the radiological images as listed below and agreed with the findings in the report. No results found.

## 2018-11-05 ENCOUNTER — Telehealth: Payer: Self-pay | Admitting: Hematology

## 2018-11-05 NOTE — Telephone Encounter (Signed)
Called and spoke with patient's mother regarding appointments added per 7/14 los & staff message

## 2018-11-07 ENCOUNTER — Telehealth: Payer: Self-pay | Admitting: Hematology & Oncology

## 2018-11-07 ENCOUNTER — Inpatient Hospital Stay: Payer: BLUE CROSS/BLUE SHIELD

## 2018-11-07 NOTE — Telephone Encounter (Signed)
Called and spoke with patient regarding r/s her appointment from 7/17 until next week.  She was ok with 7/21 and will CB if she needs to r/s again

## 2018-11-11 ENCOUNTER — Inpatient Hospital Stay: Payer: BLUE CROSS/BLUE SHIELD

## 2018-11-13 ENCOUNTER — Other Ambulatory Visit: Payer: BLUE CROSS/BLUE SHIELD

## 2018-11-13 ENCOUNTER — Ambulatory Visit: Payer: BLUE CROSS/BLUE SHIELD | Admitting: Hematology

## 2018-11-14 ENCOUNTER — Encounter: Payer: Self-pay | Admitting: *Deleted

## 2018-11-14 ENCOUNTER — Inpatient Hospital Stay: Payer: BLUE CROSS/BLUE SHIELD

## 2018-11-18 ENCOUNTER — Inpatient Hospital Stay: Payer: BLUE CROSS/BLUE SHIELD

## 2018-11-18 ENCOUNTER — Other Ambulatory Visit: Payer: Self-pay

## 2018-11-18 VITALS — BP 141/102 | HR 104 | Temp 96.9°F | Resp 18

## 2018-11-18 DIAGNOSIS — D500 Iron deficiency anemia secondary to blood loss (chronic): Secondary | ICD-10-CM | POA: Diagnosis not present

## 2018-11-18 MED ORDER — SODIUM CHLORIDE 0.9 % IV SOLN
510.0000 mg | Freq: Once | INTRAVENOUS | Status: AC
  Administered 2018-11-18: 510 mg via INTRAVENOUS
  Filled 2018-11-18: qty 510

## 2018-11-18 MED ORDER — SODIUM CHLORIDE 0.9 % IV SOLN
Freq: Once | INTRAVENOUS | Status: AC
  Administered 2018-11-18: 15:00:00 via INTRAVENOUS
  Filled 2018-11-18: qty 250

## 2018-11-18 NOTE — Patient Instructions (Signed)

## 2018-11-25 ENCOUNTER — Inpatient Hospital Stay: Payer: BLUE CROSS/BLUE SHIELD

## 2018-11-28 ENCOUNTER — Other Ambulatory Visit: Payer: Self-pay

## 2018-11-28 ENCOUNTER — Inpatient Hospital Stay: Payer: BLUE CROSS/BLUE SHIELD | Attending: Hematology

## 2018-11-28 VITALS — BP 142/82 | HR 81 | Temp 97.5°F | Resp 19

## 2018-11-28 DIAGNOSIS — Z79899 Other long term (current) drug therapy: Secondary | ICD-10-CM | POA: Diagnosis not present

## 2018-11-28 DIAGNOSIS — D500 Iron deficiency anemia secondary to blood loss (chronic): Secondary | ICD-10-CM | POA: Diagnosis present

## 2018-11-28 MED ORDER — SODIUM CHLORIDE 0.9 % IV SOLN
Freq: Once | INTRAVENOUS | Status: AC
  Administered 2018-11-28: 16:00:00 via INTRAVENOUS
  Filled 2018-11-28: qty 250

## 2018-11-28 MED ORDER — SODIUM CHLORIDE 0.9 % IV SOLN
510.0000 mg | Freq: Once | INTRAVENOUS | Status: AC
  Administered 2018-11-28: 16:00:00 510 mg via INTRAVENOUS
  Filled 2018-11-28: qty 510

## 2018-12-04 ENCOUNTER — Other Ambulatory Visit: Payer: Self-pay | Admitting: Physician Assistant

## 2018-12-31 ENCOUNTER — Emergency Department (HOSPITAL_BASED_OUTPATIENT_CLINIC_OR_DEPARTMENT_OTHER)
Admission: EM | Admit: 2018-12-31 | Discharge: 2018-12-31 | Disposition: A | Discharge status: Home / Self Care | Payer: BLUE CROSS/BLUE SHIELD | Attending: Emergency Medicine | Admitting: Emergency Medicine

## 2018-12-31 ENCOUNTER — Encounter (HOSPITAL_BASED_OUTPATIENT_CLINIC_OR_DEPARTMENT_OTHER): Payer: Self-pay | Admitting: *Deleted

## 2018-12-31 ENCOUNTER — Other Ambulatory Visit: Payer: Self-pay

## 2018-12-31 DIAGNOSIS — E039 Hypothyroidism, unspecified: Secondary | ICD-10-CM | POA: Insufficient documentation

## 2018-12-31 DIAGNOSIS — I10 Essential (primary) hypertension: Secondary | ICD-10-CM | POA: Diagnosis not present

## 2018-12-31 DIAGNOSIS — Z79899 Other long term (current) drug therapy: Secondary | ICD-10-CM | POA: Insufficient documentation

## 2018-12-31 DIAGNOSIS — R51 Headache: Secondary | ICD-10-CM | POA: Diagnosis present

## 2018-12-31 DIAGNOSIS — Z86711 Personal history of pulmonary embolism: Secondary | ICD-10-CM | POA: Diagnosis not present

## 2018-12-31 DIAGNOSIS — G43809 Other migraine, not intractable, without status migrainosus: Secondary | ICD-10-CM | POA: Diagnosis not present

## 2018-12-31 DIAGNOSIS — Z7982 Long term (current) use of aspirin: Secondary | ICD-10-CM | POA: Insufficient documentation

## 2018-12-31 MED ORDER — PROCHLORPERAZINE MALEATE 10 MG PO TABS: 10.0000 mg | ORAL_TABLET | Freq: Once | ORAL | Status: DC

## 2018-12-31 MED ORDER — DIPHENHYDRAMINE HCL 25 MG PO CAPS
25.0000 mg | ORAL_CAPSULE | Freq: Once | ORAL | Status: AC
  Administered 2018-12-31: 21:00:00 25 mg via ORAL
  Filled 2018-12-31: qty 1

## 2018-12-31 MED ORDER — HYDROMORPHONE HCL 1 MG/ML IJ SOLN
1.0000 mg | Freq: Once | INTRAMUSCULAR | Status: AC
  Administered 2018-12-31: 21:00:00 1 mg via INTRAMUSCULAR
  Filled 2018-12-31: qty 1

## 2018-12-31 MED ORDER — PROCHLORPERAZINE EDISYLATE 10 MG/2ML IJ SOLN
10.0000 mg | Freq: Once | INTRAMUSCULAR | Status: AC
  Administered 2018-12-31: 21:00:00 10 mg via INTRAMUSCULAR
  Filled 2018-12-31: qty 2

## 2018-12-31 MED ORDER — PROMETHAZINE HCL 25 MG PO TABS: 25.0000 mg | ORAL_TABLET | Freq: Four times a day (QID) | ORAL | 0 refills | Status: DC | PRN

## 2018-12-31 NOTE — ED Notes (Signed)
ED Provider at bedside. 
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
## 2018-12-31 NOTE — ED Triage Notes (Signed)
C/o h/a x 2 days , pt requesting dilaudid.

## 2019-01-07 ENCOUNTER — Emergency Department (HOSPITAL_BASED_OUTPATIENT_CLINIC_OR_DEPARTMENT_OTHER): Payer: BLUE CROSS/BLUE SHIELD

## 2019-01-07 ENCOUNTER — Encounter (HOSPITAL_BASED_OUTPATIENT_CLINIC_OR_DEPARTMENT_OTHER): Payer: Self-pay | Admitting: Emergency Medicine

## 2019-01-07 ENCOUNTER — Other Ambulatory Visit: Payer: Self-pay

## 2019-01-07 ENCOUNTER — Emergency Department (HOSPITAL_BASED_OUTPATIENT_CLINIC_OR_DEPARTMENT_OTHER)
Admission: EM | Admit: 2019-01-07 | Discharge: 2019-01-07 | Disposition: A | Discharge status: Home / Self Care | Payer: BLUE CROSS/BLUE SHIELD | Attending: Emergency Medicine | Admitting: Emergency Medicine

## 2019-01-07 DIAGNOSIS — E039 Hypothyroidism, unspecified: Secondary | ICD-10-CM | POA: Insufficient documentation

## 2019-01-07 DIAGNOSIS — G43809 Other migraine, not intractable, without status migrainosus: Secondary | ICD-10-CM | POA: Insufficient documentation

## 2019-01-07 DIAGNOSIS — I10 Essential (primary) hypertension: Secondary | ICD-10-CM | POA: Insufficient documentation

## 2019-01-07 DIAGNOSIS — Z7982 Long term (current) use of aspirin: Secondary | ICD-10-CM | POA: Diagnosis not present

## 2019-01-07 DIAGNOSIS — Z79899 Other long term (current) drug therapy: Secondary | ICD-10-CM | POA: Diagnosis not present

## 2019-01-07 DIAGNOSIS — Z86711 Personal history of pulmonary embolism: Secondary | ICD-10-CM | POA: Diagnosis not present

## 2019-01-07 LAB — PREGNANCY, URINE: Preg Test, Ur: NEGATIVE

## 2019-01-07 MED ORDER — METOCLOPRAMIDE HCL 5 MG/ML IJ SOLN
10.0000 mg | Freq: Once | INTRAMUSCULAR | Status: AC
  Administered 2019-01-07: 10 mg via INTRAVENOUS
  Filled 2019-01-07: qty 2

## 2019-01-07 MED ORDER — KETOROLAC TROMETHAMINE 30 MG/ML IJ SOLN
15.0000 mg | Freq: Once | INTRAMUSCULAR | Status: AC
  Administered 2019-01-07: 04:00:00 15 mg via INTRAVENOUS
  Filled 2019-01-07: qty 1

## 2019-01-07 MED ORDER — PROMETHAZINE HCL 25 MG/ML IJ SOLN
12.5000 mg | Freq: Once | INTRAMUSCULAR | Status: AC
  Administered 2019-01-07: 04:00:00 12.5 mg via INTRAVENOUS
  Filled 2019-01-07: qty 1

## 2019-01-07 MED ORDER — DIPHENHYDRAMINE HCL 50 MG/ML IJ SOLN
25.0000 mg | Freq: Once | INTRAMUSCULAR | Status: AC
  Administered 2019-01-07: 04:00:00 25 mg via INTRAVENOUS
  Filled 2019-01-07: qty 1

## 2019-01-07 MED ORDER — MAGNESIUM SULFATE 2 GM/50ML IV SOLN
2.0000 g | Freq: Once | INTRAVENOUS | Status: AC
  Administered 2019-01-07: 2 g via INTRAVENOUS
  Filled 2019-01-07: qty 50

## 2019-01-07 MED ORDER — DIVALPROEX SODIUM 500 MG PO DR TAB
500.0000 mg | DELAYED_RELEASE_TABLET | Freq: Two times a day (BID) | ORAL | Status: DC
  Filled 2019-01-07: qty 1
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
## 2019-01-07 NOTE — ED Notes (Signed)
Attempted IV in left and right AC unsuccessful.

## 2019-01-07 NOTE — ED Triage Notes (Signed)
Pt c/o migraine headache x 2 days with nausea.

## 2019-02-05 ENCOUNTER — Other Ambulatory Visit: Payer: BLUE CROSS/BLUE SHIELD

## 2019-02-05 ENCOUNTER — Ambulatory Visit: Payer: BLUE CROSS/BLUE SHIELD | Admitting: Hematology

## 2019-04-03 ENCOUNTER — Telehealth: Payer: Self-pay | Admitting: Hematology & Oncology

## 2019-04-03 NOTE — Telephone Encounter (Signed)
Faxed medical records to: [facility]  [address], [place] [address]. Phone: [phone]  Fax: [phone]

## 2019-08-31 ENCOUNTER — Other Ambulatory Visit: Payer: Self-pay | Admitting: Physician Assistant

## 2021-08-11 ENCOUNTER — Encounter: Payer: Self-pay | Admitting: Hematology

## 2021-08-11 NOTE — Telephone Encounter (Signed)
Na

## 2021-09-28 ENCOUNTER — Encounter (HOSPITAL_BASED_OUTPATIENT_CLINIC_OR_DEPARTMENT_OTHER): Payer: Self-pay | Admitting: Emergency Medicine

## 2021-09-28 ENCOUNTER — Encounter: Payer: Self-pay | Admitting: Hematology

## 2021-09-28 ENCOUNTER — Other Ambulatory Visit: Payer: Self-pay

## 2021-09-28 ENCOUNTER — Emergency Department (HOSPITAL_BASED_OUTPATIENT_CLINIC_OR_DEPARTMENT_OTHER)
Admission: EM | Admit: 2021-09-28 | Discharge: 2021-09-29 | Disposition: A | Discharge status: Home / Self Care | Payer: Managed Care, Other (non HMO) | Attending: Emergency Medicine | Admitting: Emergency Medicine

## 2021-09-28 DIAGNOSIS — K5900 Constipation, unspecified: Secondary | ICD-10-CM | POA: Diagnosis not present

## 2021-09-28 DIAGNOSIS — R0789 Other chest pain: Secondary | ICD-10-CM | POA: Diagnosis not present

## 2021-09-28 DIAGNOSIS — R1013 Epigastric pain: Secondary | ICD-10-CM | POA: Diagnosis present

## 2021-09-28 DIAGNOSIS — E039 Hypothyroidism, unspecified: Secondary | ICD-10-CM | POA: Diagnosis not present

## 2021-09-28 DIAGNOSIS — R110 Nausea: Secondary | ICD-10-CM | POA: Insufficient documentation

## 2021-09-28 DIAGNOSIS — Z79899 Other long term (current) drug therapy: Secondary | ICD-10-CM | POA: Diagnosis not present

## 2021-09-28 HISTORY — DX: Antiphospholipid syndrome: D68.61

## 2021-09-28 LAB — LIPASE, BLOOD: Lipase: 33 U/L (ref 11–51)

## 2021-09-28 LAB — COMPREHENSIVE METABOLIC PANEL
ALT: 16 U/L (ref 0–44)
AST: 17 U/L (ref 15–41)
Albumin: 3 g/dL — ABNORMAL LOW (ref 3.5–5.0)
Alkaline Phosphatase: 92 U/L (ref 38–126)
Anion gap: 5 (ref 5–15)
BUN: 13 mg/dL (ref 6–20)
CO2: 24 mmol/L (ref 22–32)
Calcium: 8.4 mg/dL — ABNORMAL LOW (ref 8.9–10.3)
Chloride: 108 mmol/L (ref 98–111)
Creatinine, Ser: 0.92 mg/dL (ref 0.44–1.00)
GFR, Estimated: >60 mL/min (ref >60)
Glucose, Bld: 85 mg/dL (ref 70–99)
Potassium: 3.7 mmol/L (ref 3.5–5.1)
Sodium: 137 mmol/L (ref 135–145)
Total Bilirubin: 0.3 mg/dL (ref 0.3–1.2)
Total Protein: 7.3 g/dL (ref 6.5–8.1)

## 2021-09-28 LAB — URINALYSIS, ROUTINE W REFLEX MICROSCOPIC
Bilirubin Urine: NEGATIVE
Glucose, UA: NEGATIVE mg/dL
Ketones, ur: NEGATIVE mg/dL
Nitrite: POSITIVE — AB
Protein, ur: NEGATIVE mg/dL
Specific Gravity, Urine: <=1.005 (ref 1.005–1.030)
pH: 5.5 (ref 5.0–8.0)

## 2021-09-28 LAB — CBC WITH DIFFERENTIAL/PLATELET
Abs Immature Granulocytes: 0.02 10*3/uL (ref 0.00–0.07)
Basophils Absolute: 0.1 10*3/uL (ref 0.0–0.1)
Basophils Relative: 1 %
Eosinophils Absolute: 0.1 10*3/uL (ref 0.0–0.5)
Eosinophils Relative: 2 %
HCT: 29.7 % — ABNORMAL LOW (ref 36.0–46.0)
Hemoglobin: 9.3 g/dL — ABNORMAL LOW (ref 12.0–15.0)
Immature Granulocytes: 0 %
Lymphocytes Relative: 36 %
Lymphs Abs: 3 10*3/uL (ref 0.7–4.0)
MCH: 22.1 pg — ABNORMAL LOW (ref 26.0–34.0)
MCHC: 31.3 g/dL (ref 30.0–36.0)
MCV: 70.5 fL — ABNORMAL LOW (ref 80.0–100.0)
Monocytes Absolute: 0.7 10*3/uL (ref 0.1–1.0)
Monocytes Relative: 8 %
Neutro Abs: 4.4 10*3/uL (ref 1.7–7.7)
Neutrophils Relative %: 53 %
Platelets: 331 10*3/uL (ref 150–400)
RBC: 4.21 MIL/uL (ref 3.87–5.11)
RDW: 17.2 % — ABNORMAL HIGH (ref 11.5–15.5)
WBC: 8.2 10*3/uL (ref 4.0–10.5)
nRBC: 0 % (ref 0.0–0.2)

## 2021-09-28 LAB — URINALYSIS, MICROSCOPIC (REFLEX)

## 2021-09-28 LAB — PREGNANCY, URINE: Preg Test, Ur: NEGATIVE

## 2021-09-28 MED ORDER — ALUM & MAG HYDROXIDE-SIMETH 200-200-20 MG/5ML PO SUSP
30.0000 mL | Freq: Once | ORAL | Status: AC
  Administered 2021-09-28: 30 mL via ORAL
  Filled 2021-09-28: qty 30

## 2021-09-28 MED ORDER — MAALOX MAX 400-400-40 MG/5ML PO SUSP: 5.0000 mL | Freq: Four times a day (QID) | ORAL | 0 refills | Status: AC | PRN

## 2021-09-28 MED ORDER — LIDOCAINE VISCOUS HCL 2 % MT SOLN
15.0000 mL | Freq: Once | OROMUCOSAL | Status: AC
  Administered 2021-09-28: 15 mL via ORAL
  Filled 2021-09-28: qty 15

## 2021-09-28 MED ORDER — SODIUM CHLORIDE 0.9 % IV BOLUS
1000.0000 mL | Freq: Once | INTRAVENOUS | Status: AC
  Administered 2021-09-28: 1000 mL via INTRAVENOUS

## 2021-09-28 MED ORDER — PANTOPRAZOLE SODIUM 40 MG PO TBEC: 40.0000 mg | DELAYED_RELEASE_TABLET | Freq: Every day | ORAL | 0 refills | Status: DC

## 2021-09-28 MED ORDER — FAMOTIDINE IN NACL 20-0.9 MG/50ML-% IV SOLN
20.0000 mg | Freq: Once | INTRAVENOUS | Status: AC
  Administered 2021-09-28: 20 mg via INTRAVENOUS
  Filled 2021-09-28: qty 50

## 2021-09-28 MED ORDER — PANTOPRAZOLE SODIUM 40 MG IV SOLR
40.0000 mg | Freq: Once | INTRAVENOUS | Status: AC
  Administered 2021-09-28: 40 mg via INTRAVENOUS
  Filled 2021-09-28: qty 10

## 2021-09-28 NOTE — Discharge Instructions (Addendum)
You were evaluated at [facility] ED for dyspepsia/GERD. Your labwork looked good! No liver or kidney problems, no electrolyte abnormalities. Your red blood cell count is a little low, I would speak to your primary care physician about this to further work this up.   We would like for you to start taking the following medications:  Maalox suspension: take several times a day per instructions on the bottle for stomach pain Protonix: take '40mg'$  tablet once daily You can also take pepcid in addition to this which you can get over the counter.  If your pain worsens please come back to the ED for re-evaluation.  Thank you for allowing [place] to be part of your care.

## 2021-09-28 NOTE — ED Notes (Addendum)
Attempted to get blood work x 3 using butterfly - unsuccessful Have asked RT to try with [place]

## 2021-09-28 NOTE — ED Provider Notes (Signed)
[facility] Provider Note   CSN: [id number] Arrival date & time: 09/28/21  1909     History  Chief Complaint  Patient presents with   Abdominal Pain   Flank Pain    Abdominal Pain Associated symptoms: chest pain, constipation and nausea   Associated symptoms: no cough, no diarrhea, no dysuria, no fever, no shortness of breath, no sore throat, no vaginal discharge and no vomiting   Flank Pain Associated symptoms include chest pain, abdominal pain and headaches. Pertinent negatives include no shortness of breath.   Diane [name] is a 51 y.o. female with PMHx of obesity, IBS-C, GERD with esophagitis, esophageal dysmotility, anxiety, hypothyroidism, who presents to [facility] ED for evaluation of midline back and abdominal pain. She reports gradual onset epigastric pain over the last several days. She went to [facility] though they had concerns she may need imaging and sent her to the ED. She has constant aching epigastric pain that will go up into her chest when she is lying down, occasionally sharp stabbing pains in epigastrium. This pain is worse before she eats when her stomach is empty, is worse several hours after she has eaten, she will wake up at night with epigastric pain. She denies vomiting, denies flank pain. Endorses lots of water intake. She is currently not taking anything for heart burn. Previously she had been on Nexium and Prilosec though reports these did not work for her. She denies ibuprofen use though takes ASA '324mg'$  daily for APLS since she was not able to tolerate blood thinners. She does try to take the ASA on a full stomach. States taking ASA makes her symptoms worse. She follows with GI, Dr. Percell Miller, last OV was in 2019, has a diagnosis of  GERD/Dyspepsia, IBS and chronic constipation. Patient reports recurrent ulcers though GI states no ulcers, EGD normal and started on amitriptyline for non ulcer dyspepsia in 2016. Patient currently using foods high  in fiber for constipation, LBM yesterday. She takes natural supplements and chews on ginger for her other GI symptoms.     Home Medications Prior to Admission medications   Medication Sig Start Date End Date Taking? Authorizing Provider  alum & mag hydroxide-simeth (MAALOX MAX) 400-400-40 MG/5ML suspension Take 5 mLs by mouth every 6 (six) hours as needed for indigestion. 09/28/21 10/28/21 Yes Wayland Denis, MD  pantoprazole (PROTONIX) 40 MG tablet Take 1 tablet (40 mg total) by mouth daily. 09/28/21  Yes Wayland Denis, MD  amitriptyline (ELAVIL) 10 MG tablet Take 20 mg by mouth at bedtime as needed.  06/29/16   [provider]  aspirin 325 MG tablet Take 325 mg by mouth daily.    [provider]  buPROPion (WELLBUTRIN XL) 150 MG 24 hr tablet Take 1 tablet (150 mg total) by mouth daily. 08/29/16   Hali Marry, MD  clonazePAM (KLONOPIN) 1 MG tablet TAKE 1 TABLET BY MOUTH TWICE DAILY AS NEEDED FOR ANXIETY Patient taking differently: TAKE 1 TABLET BY MOUTH TWICE DAILY 08/29/16   Hali Marry, MD  Gabapentin, Once-Daily, (GRALISE) 600 MG TABS Take 1,800 mg by mouth at bedtime.  10/24/15   [provider]  levothyroxine (SYNTHROID) 50 MCG tablet Take 50 mcg by mouth daily before breakfast.    [provider]  metFORMIN (GLUCOPHAGE) 500 MG tablet Take 500 mg by mouth 2 (two) times daily. 09/11/17   [provider]  OMEGA-3 FATTY ACIDS PO Take by mouth.    [provider]  ondansetron (  ZOFRAN ODT) 4 MG disintegrating tablet Take 1 tablet (4 mg total) by mouth every 8 (eight) hours as needed for nausea or vomiting. 09/13/15   Hali Marry, MD  promethazine (PHENERGAN) 25 MG tablet Take 1 tablet (25 mg total) by mouth every 6 (six) hours as needed for up to 10 doses for nausea or vomiting. 12/31/18   Curatolo, Adam, DO  SAVELLA 25 MG TABS Take 3 tablets by mouth every morning. 10/30/18   [provider]  traMADol (ULTRAM) 50 MG tablet  Take 1 tablet by mouth every 4 to 6 hours as needed for pain 07/10/16   [provider]  verapamil (CALAN) 40 MG tablet TAKE 1 TAB BY MOUTH TWICE DAILY. MAY TAKE ADDITIONAL TAB AS NEEDED FOR PALPITATIONS (HR>60, BP>100) 09/02/19   Almyra Deforest, PA  WELLBUTRIN XL 300 MG 24 hr tablet Take 300 mg by mouth daily. 09/27/17   [provider]  ZOMIG 5 MG nasal solution Place 1 spray into the nose daily as needed for migraine. May repeat in 2 hours if needed. Max of 2 doses in 24 hours 09/11/14   [provider]      Allergies    Topiramate, Lisinopril, Prednisone, Relpax [eletriptan hydrobromide], Eletriptan, and Kiwi extract    Review of Systems   Review of Systems  Constitutional:  Negative for fever.  HENT:  Negative for congestion, rhinorrhea and sore throat.   Respiratory:  Positive for chest tightness. Negative for cough and shortness of breath.   Cardiovascular:  Positive for chest pain.  Gastrointestinal:  Positive for abdominal pain, constipation and nausea. Negative for diarrhea and vomiting.  Genitourinary:  Positive for frequency. Negative for dysuria and vaginal discharge.  Musculoskeletal:  Positive for back pain.  Neurological:  Positive for headaches.  Psychiatric/Behavioral:  The patient is nervous/anxious.     Physical Exam Updated Vital Signs BP (!) 150/87 (BP Location: Right Arm)   Pulse 87   Temp 98.4 F (36.9 C) (Oral)   Resp 12   Ht '5\' 4"'$  (1.626 m)   Wt 99.1 kg   LMP 09/21/2021 (Approximate)   SpO2 99%   BMI 37.50 kg/m  Physical Exam Constitutional:      General: She is not in acute distress.    Appearance: She is obese. She is not toxic-appearing.  HENT:     Head: Normocephalic and atraumatic.     Mouth/Throat:     Mouth: Mucous membranes are moist.     Pharynx: No pharyngeal swelling.  Eyes:     General: No scleral icterus. Cardiovascular:     Rate and Rhythm: Normal rate and regular rhythm.     Heart sounds: Normal heart sounds.   Pulmonary:     Effort: Pulmonary effort is normal.     Breath sounds: Normal breath sounds.  Chest:     Chest wall: Tenderness present.  Abdominal:     General: Abdomen is flat. Bowel sounds are normal. There is no distension.     Palpations: Abdomen is soft. There is no hepatomegaly, splenomegaly or mass.     Tenderness: There is abdominal tenderness in the epigastric area. There is no guarding.  Musculoskeletal:     Right lower leg: No edema.     Left lower leg: No edema.  Skin:    General: Skin is warm and dry.  Neurological:     Mental Status: She is alert and oriented to person, place, and time. Mental status is at baseline.  Psychiatric:  Mood and Affect: Mood is anxious.        Behavior: Behavior normal.     ED Results / Procedures / Treatments   Labs (all labs ordered are listed, but only abnormal results are displayed) Labs Reviewed  URINALYSIS, ROUTINE W REFLEX MICROSCOPIC - Abnormal; Notable for the following components:      Result Value   Color, Urine STRAW (*)    APPearance HAZY (*)    Hgb urine dipstick LARGE (*)    Nitrite POSITIVE (*)    Leukocytes,Ua TRACE (*)    All other components within normal limits  URINALYSIS, MICROSCOPIC (REFLEX) - Abnormal; Notable for the following components:   Bacteria, UA FEW (*)    All other components within normal limits  CBC WITH DIFFERENTIAL/PLATELET - Abnormal; Notable for the following components:   Hemoglobin 9.3 (*)    HCT 29.7 (*)    MCV 70.5 (*)    MCH 22.1 (*)    RDW 17.2 (*)    All other components within normal limits  COMPREHENSIVE METABOLIC PANEL - Abnormal; Notable for the following components:   Calcium 8.4 (*)    Albumin 3.0 (*)    All other components within normal limits  PREGNANCY, URINE  LIPASE, BLOOD    EKG EKG Interpretation  Date/Time:  Thursday September 28 2021 22:51:37 EDT Ventricular Rate:  85 PR Interval:  126 QRS Duration: 85 QT Interval:  489 QTC Calculation: 582 R  Axis:   -23 Text Interpretation: Sinus rhythm LVH by voltage Borderline T abnormalities, lateral leads No significant change since last tracing Confirmed by Wandra Arthurs [phone]) on 09/28/2021 11:23:19 PM   Medications Ordered in ED Medications  sodium chloride 0.9 % bolus 1,000 mL (1,000 mLs Intravenous New Bag/Given 09/28/21 2104)  pantoprazole (PROTONIX) injection 40 mg (40 mg Intravenous Given 09/28/21 2138)  famotidine (PEPCID) IVPB 20 mg premix (20 mg Intravenous New Bag/Given 09/28/21 2308)  alum & mag hydroxide-simeth (MAALOX/MYLANTA) 200-200-20 MG/5ML suspension 30 mL (30 mLs Oral Given 09/28/21 2137)    And  lidocaine (XYLOCAINE) 2 % viscous mouth solution 15 mL (15 mLs Oral Given 09/28/21 2137)    ED Course/ Medical Decision Making/ A&P                           Medical Decision Making Amount and/or Complexity of Data Reviewed Labs: ordered. ECG/medicine tests: ordered.  Risk OTC drugs. Prescription drug management.  Diane [name] is a 51 y.o. female with PMHx of obesity, IBS-C, GERD with esophagitis, esophageal dysmotility, anxiety, hypothyroidism, who presents to [facility] ED for evaluation of midline back and abdominal pain. Low concern for aortic dissection given patient is NAD, seated comfortably, HDS, afebrile. Will obtain EKG, though low concern for ACS given atypical chest pain. Clinical picture and exam more consistent with GERD/Dyspepsia which patient has a history of and is currently untreated. Will try GI cocktail of maalox, lidocaine solution, pepcid, protonix, and give NS bolus. Do not feel additional imaging will add to evaluation at this time. Labwork notable for normal LFTs, otherwise fairly unremarkable, lipase normal, CBC without leukocytosis and mild microcytic anemia in patient with hx of IDA.   EKG without ischemic changes.  Patient had improvement in symptoms with GI cocktail, low concern for acute ACS, will hold off on ordering cardiac biomarkers.  Suspect her  symptoms are related to dyspepsia/GERD.  Her symptoms are managed at this time, she is hemodynamically stable and medically ready  for discharge. Will discharge patient with Maalox and Protonix one month supply. She has follow up with Dr. Percell Miller scheduled for 7/24, encouraged patient to discuss these symptoms at follow up appointment. Suggested patient also see their PCP within the next 1-2 weeks.  Discussed ED return precautions.  Final Clinical Impression(s) / ED Diagnoses Final diagnoses:  Dyspepsia    Rx / DC Orders ED Discharge Orders          Ordered    alum & mag hydroxide-simeth (MAALOX MAX) 753-005-11 MG/5ML suspension  Every 6 hours PRN        09/28/21 2332    pantoprazole (PROTONIX) 40 MG tablet  Daily        09/28/21 2332              Wayland Denis, MD 09/28/21 2355    Drenda Freeze, MD 09/29/21 1737

## 2021-09-28 NOTE — ED Triage Notes (Signed)
States feels like hunger pain or air is going her spine. Thinks It could be a kidney stone. Was see by UC and told their lab is down.

## 2021-09-28 NOTE — ED Notes (Signed)
Abdominal pain that has been going on for several weeks, hx of cystitis, gastritis and diverticulitis.  States it feels like hunger pains, some nausea no vomiting

## 2021-11-11 ENCOUNTER — Emergency Department (HOSPITAL_BASED_OUTPATIENT_CLINIC_OR_DEPARTMENT_OTHER)
Admission: EM | Admit: 2021-11-11 | Discharge: 2021-11-12 | Disposition: A | Discharge status: Home / Self Care | Payer: Managed Care, Other (non HMO) | Attending: Emergency Medicine | Admitting: Emergency Medicine

## 2021-11-11 ENCOUNTER — Emergency Department (HOSPITAL_BASED_OUTPATIENT_CLINIC_OR_DEPARTMENT_OTHER): Payer: Managed Care, Other (non HMO)

## 2021-11-11 ENCOUNTER — Encounter (HOSPITAL_BASED_OUTPATIENT_CLINIC_OR_DEPARTMENT_OTHER): Payer: Self-pay | Admitting: Emergency Medicine

## 2021-11-11 ENCOUNTER — Other Ambulatory Visit: Payer: Self-pay

## 2021-11-11 ENCOUNTER — Encounter: Payer: Self-pay | Admitting: Hematology

## 2021-11-11 DIAGNOSIS — Z7982 Long term (current) use of aspirin: Secondary | ICD-10-CM | POA: Diagnosis not present

## 2021-11-11 DIAGNOSIS — Z23 Encounter for immunization: Secondary | ICD-10-CM | POA: Diagnosis not present

## 2021-11-11 DIAGNOSIS — M25512 Pain in left shoulder: Secondary | ICD-10-CM | POA: Insufficient documentation

## 2021-11-11 MED ORDER — ACETAMINOPHEN 500 MG PO TABS
1000.0000 mg | ORAL_TABLET | Freq: Once | ORAL | Status: AC
  Administered 2021-11-11: 1000 mg via ORAL
  Filled 2021-11-11: qty 2

## 2021-11-11 MED ORDER — TETANUS-DIPHTH-ACELL PERTUSSIS 5-2.5-18.5 LF-MCG/0.5 IM SUSY
0.5000 mL | PREFILLED_SYRINGE | Freq: Once | INTRAMUSCULAR | Status: AC
  Administered 2021-11-11: 0.5 mL via INTRAMUSCULAR
  Filled 2021-11-11: qty 0.5

## 2021-11-11 MED ORDER — LIDOCAINE 5 % EX PTCH
3.0000 | MEDICATED_PATCH | CUTANEOUS | Status: DC
  Administered 2021-11-11: 3 via TRANSDERMAL
  Filled 2021-11-11: qty 3

## 2021-11-11 MED ORDER — NAPROXEN 375 MG PO TABS: 375.0000 mg | ORAL_TABLET | Freq: Two times a day (BID) | ORAL | 0 refills | Status: DC
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
## 2021-11-11 NOTE — ED Notes (Signed)
Patient transported to CT 

## 2021-11-12 ENCOUNTER — Encounter: Payer: Self-pay | Admitting: Hematology

## 2021-11-12 MED ORDER — NAPROXEN 375 MG PO TABS: 375.0000 mg | ORAL_TABLET | Freq: Two times a day (BID) | ORAL | 0 refills | Status: DC

## 2021-11-12 NOTE — ED Notes (Signed)
Patient verbalizes understanding of discharge instructions. Opportunity for questioning and answers were provided. Armband removed by staff, pt discharged from ED. Ambulated out to lobby  
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
## 2022-04-30 ENCOUNTER — Ambulatory Visit: Payer: Self-pay | Admitting: Cardiology

## 2022-05-16 ENCOUNTER — Ambulatory Visit: Payer: Self-pay | Admitting: Cardiology
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
## 2022-05-31 ENCOUNTER — Ambulatory Visit: Payer: Self-pay | Admitting: Cardiology
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
## 2022-06-28 ENCOUNTER — Ambulatory Visit: Payer: Federal, State, Local not specified - PPO | Attending: Cardiology | Admitting: Cardiology

## 2022-06-28 ENCOUNTER — Encounter: Payer: Self-pay | Admitting: Hematology

## 2022-06-28 ENCOUNTER — Encounter: Payer: Self-pay | Admitting: Cardiology

## 2022-06-28 VITALS — BP 140/62 | HR 96 | Ht 65.0 in | Wt 226.0 lb

## 2022-06-28 DIAGNOSIS — I10 Essential (primary) hypertension: Secondary | ICD-10-CM | POA: Diagnosis not present

## 2022-06-28 DIAGNOSIS — R072 Precordial pain: Secondary | ICD-10-CM | POA: Diagnosis not present

## 2022-06-28 DIAGNOSIS — R002 Palpitations: Secondary | ICD-10-CM

## 2022-06-28 MED ORDER — VERAPAMIL HCL ER 120 MG PO TBCR: 120.0000 mg | EXTENDED_RELEASE_TABLET | Freq: Every day | ORAL | 3 refills | Status: DC

## 2022-06-28 NOTE — Patient Instructions (Signed)
Medication Instructions:   CHANGE VERAPAMIL TO 120 MG ONCE DAILY  *If you need a refill on your cardiac medications before your next appointment, please call your pharmacy*   Testing/Procedures:  Your physician has requested that you have an echocardiogram. Echocardiography is a painless test that uses sound waves to create images of your heart. It provides your doctor with information about the size and shape of your heart and how well your heart's chambers and valves are working. This procedure takes approximately one hour. There are no restrictions for this procedure. Please do NOT wear cologne, perfume, aftershave, or lotions (deodorant is allowed). Please arrive 15 minutes prior to your appointment time. [facility]   CORONARY CALCIUM SCORING CT SCAN AT [facility]  Follow-Up: At [facility] you and your health needs are our priority.  As part of our continuing mission to provide you with exceptional heart care, we have created designated Provider Care Teams.  These Care Teams include your primary Cardiologist (physician) and Advanced Practice Providers (APPs -  Physician Assistants and Nurse Practitioners) who all work together to provide you with the care you need, when you need it.  We recommend signing up for the patient portal called "MyChart".  Sign up information is provided on this After Visit Summary.  MyChart is used to connect with patients for Virtual Visits (Telemedicine).  Patients are able to view lab/test results, encounter notes, upcoming appointments, etc.  Non-urgent messages can be sent to your provider as well.   To learn more about what you can do with MyChart, go to [email or web address].    Your next appointment:   12 month(s)  Provider:   Kirk Ruths, MD

## 2022-07-06 ENCOUNTER — Encounter: Payer: Self-pay | Admitting: Hematology

## 2022-08-02 ENCOUNTER — Other Ambulatory Visit (INDEPENDENT_AMBULATORY_CARE_PROVIDER_SITE_OTHER): Payer: Federal, State, Local not specified - PPO

## 2022-08-02 ENCOUNTER — Encounter: Payer: Self-pay | Admitting: *Deleted

## 2022-08-02 ENCOUNTER — Ambulatory Visit (HOSPITAL_BASED_OUTPATIENT_CLINIC_OR_DEPARTMENT_OTHER)
Admission: RE | Admit: 2022-08-02 | Discharge: 2022-08-02 | Disposition: A | Discharge status: Home / Self Care | Payer: Federal, State, Local not specified - PPO | Source: Ambulatory Visit | Attending: Cardiology | Admitting: Cardiology

## 2022-08-02 ENCOUNTER — Ambulatory Visit (HOSPITAL_BASED_OUTPATIENT_CLINIC_OR_DEPARTMENT_OTHER): Payer: Federal, State, Local not specified - PPO

## 2022-08-02 DIAGNOSIS — R002 Palpitations: Secondary | ICD-10-CM

## 2022-08-02 DIAGNOSIS — R072 Precordial pain: Secondary | ICD-10-CM | POA: Insufficient documentation

## 2022-08-02 LAB — ECHOCARDIOGRAM COMPLETE
Area-P 1/2: 6.96 cm2
S' Lateral: 2.12 cm

## 2022-08-03 ENCOUNTER — Telehealth: Payer: Self-pay | Admitting: *Deleted

## 2022-08-03 ENCOUNTER — Encounter: Payer: Self-pay | Admitting: *Deleted

## 2022-08-03 DIAGNOSIS — I2510 Atherosclerotic heart disease of native coronary artery without angina pectoris: Secondary | ICD-10-CM

## 2022-08-03 MED ORDER — ROSUVASTATIN CALCIUM 20 MG PO TABS: 20.0000 mg | ORAL_TABLET | Freq: Every day | ORAL | 3 refills | Status: DC

## 2022-08-03 NOTE — Telephone Encounter (Signed)
-----   Message from Lewayne Bunting, MD sent at 08/03/2022  7:21 AM EDT ----- Minimally elevated Ca score; add crestor 20 mg daily; lipids and liver 8 weeks Olga Millers

## 2022-08-03 NOTE — Telephone Encounter (Signed)
pt aware of results  New script sent to the pharmacy  Lab orders mailed to the pt  

## 2022-09-10 ENCOUNTER — Telehealth: Payer: Self-pay | Admitting: Cardiology

## 2022-09-10 DIAGNOSIS — I2510 Atherosclerotic heart disease of native coronary artery without angina pectoris: Secondary | ICD-10-CM

## 2022-09-10 NOTE — Telephone Encounter (Signed)
Patient states she has been having gas since she started taking Crestor. She states it was painful as well. She stated at first she thought it was indigestion but it didn't go away. She stopped taking Crestor 4 days ago and she no longer has the painful gas. She would like an alternative. She no longer wants to take the Crestor.

## 2022-09-10 NOTE — Telephone Encounter (Signed)
Pt c/o medication issue:  1. Name of Medication: rosuvastatin (CRESTOR) 20 MG tablet   2. How are you currently taking this medication (dosage and times per day)? Stopped taking 4 days ago   3. Are you having a reaction (difficulty breathing--STAT)? Yes   4. What is your medication issue? Patient states this medication was causing too much gas so she stopped taking this med and would like to know if there are other options for this med.

## 2022-09-11 MED ORDER — ATORVASTATIN CALCIUM 20 MG PO TABS: 20.0000 mg | ORAL_TABLET | Freq: Every day | ORAL | 3 refills | Status: DC

## 2022-09-11 NOTE — Telephone Encounter (Signed)
Left message for patient with Dr Creshaw's recommendations.   New script sent to the pharmacy  Lab orders mailed to the pt  

## 2022-10-23 ENCOUNTER — Encounter: Payer: Self-pay | Admitting: *Deleted

## 2022-12-03 ENCOUNTER — Encounter: Payer: Self-pay | Admitting: *Deleted

## 2023-04-06 ENCOUNTER — Telehealth: Payer: Self-pay | Admitting: Internal Medicine

## 2023-04-06 NOTE — Telephone Encounter (Signed)
Patient called regarding palpitations that have been occurring all week, but worse today. Palpitations last for about 6 seconds and subside. Palpitations associated with chest pressure. Advise ED for further evaluation. Patient agreeable to plan .  Diane Boyden, MD MS Cardiovascular Medicine Fellow

## 2023-04-08 ENCOUNTER — Telehealth: Payer: Self-pay

## 2023-04-08 NOTE — Telephone Encounter (Signed)
Patient identification verified by 2 forms. Diane Rail, RN    Called and spoke to patient  Patient states:   -she did not present to ER for heart palpitation   -she continues to have the heart palpitation, has decreased since Saturday   -when she has palpitation she also has chest pain   -She is taking verapamil tablet, would prefer new Rx for capsule form  -is concerned about BP   -has a history of PE  Patient denies:   -SOB/difficulty breathing  Advised patient to present to ED for evaluation of chest pain with palpitation  Patient agrees, states she will present to ED tonight

## 2023-04-08 NOTE — Telephone Encounter (Signed)
 Called patient with no answer. Left message to return call.

## 2023-04-08 NOTE — Telephone Encounter (Signed)
Pt returning call. She scheduled next available with app - 04/25/23

## 2023-04-08 NOTE — Telephone Encounter (Signed)
-----   Message from Roe Rutherford Duke sent at 04/07/2023  7:58 AM EST ----- Please reach out to the patient - she called the fellow with palpitations and was advised to go to the ER. She has not shown up to the ER - see if she wants to make an appt with an APP, Dr. Jens Som patient.  Thanks Angie
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
## 2023-04-25 ENCOUNTER — Ambulatory Visit: Payer: Federal, State, Local not specified - PPO | Admitting: Physician Assistant

## 2023-04-30 ENCOUNTER — Telehealth: Payer: Self-pay | Admitting: Internal Medicine

## 2023-04-30 NOTE — Telephone Encounter (Signed)
 Ok to sch with next available provider JMP

## 2023-04-30 NOTE — Telephone Encounter (Signed)
 Good morning Dr. Albertus Supervising MD AM  The following patient needs to be seen for an obstruction in her chest that is preventing her from swallowing. She has GI history with [facility] in [place] but says that they are too far for her to travel to get the care she needs. Records are available with Care Everywhere. Please review and advise of scheduling. Thank you.

## 2023-05-02 ENCOUNTER — Emergency Department (HOSPITAL_BASED_OUTPATIENT_CLINIC_OR_DEPARTMENT_OTHER): Payer: Federal, State, Local not specified - PPO

## 2023-05-02 ENCOUNTER — Inpatient Hospital Stay (HOSPITAL_BASED_OUTPATIENT_CLINIC_OR_DEPARTMENT_OTHER)
Admission: EM | Admit: 2023-05-02 | Discharge: 2023-05-05 | DRG: 683 | Disposition: A | Discharge status: Home / Self Care | Payer: Federal, State, Local not specified - PPO | Attending: Internal Medicine | Admitting: Internal Medicine

## 2023-05-02 ENCOUNTER — Encounter (HOSPITAL_BASED_OUTPATIENT_CLINIC_OR_DEPARTMENT_OTHER): Payer: Self-pay

## 2023-05-02 ENCOUNTER — Other Ambulatory Visit: Payer: Self-pay

## 2023-05-02 DIAGNOSIS — N179 Acute kidney failure, unspecified: Secondary | ICD-10-CM | POA: Diagnosis present

## 2023-05-02 DIAGNOSIS — Z91018 Allergy to other foods: Secondary | ICD-10-CM

## 2023-05-02 DIAGNOSIS — E876 Hypokalemia: Secondary | ICD-10-CM | POA: Diagnosis present

## 2023-05-02 DIAGNOSIS — E871 Hypo-osmolality and hyponatremia: Secondary | ICD-10-CM | POA: Diagnosis present

## 2023-05-02 DIAGNOSIS — M069 Rheumatoid arthritis, unspecified: Secondary | ICD-10-CM | POA: Diagnosis present

## 2023-05-02 DIAGNOSIS — Z862 Personal history of diseases of the blood and blood-forming organs and certain disorders involving the immune mechanism: Secondary | ICD-10-CM | POA: Diagnosis not present

## 2023-05-02 DIAGNOSIS — M545 Low back pain, unspecified: Secondary | ICD-10-CM | POA: Diagnosis present

## 2023-05-02 DIAGNOSIS — Z79899 Other long term (current) drug therapy: Secondary | ICD-10-CM | POA: Diagnosis not present

## 2023-05-02 DIAGNOSIS — G43909 Migraine, unspecified, not intractable, without status migrainosus: Secondary | ICD-10-CM | POA: Diagnosis present

## 2023-05-02 DIAGNOSIS — Z818 Family history of other mental and behavioral disorders: Secondary | ICD-10-CM

## 2023-05-02 DIAGNOSIS — Z9049 Acquired absence of other specified parts of digestive tract: Secondary | ICD-10-CM

## 2023-05-02 DIAGNOSIS — R002 Palpitations: Secondary | ICD-10-CM | POA: Diagnosis present

## 2023-05-02 DIAGNOSIS — D509 Iron deficiency anemia, unspecified: Secondary | ICD-10-CM | POA: Insufficient documentation

## 2023-05-02 DIAGNOSIS — K219 Gastro-esophageal reflux disease without esophagitis: Secondary | ICD-10-CM | POA: Diagnosis present

## 2023-05-02 DIAGNOSIS — E860 Dehydration: Secondary | ICD-10-CM | POA: Diagnosis present

## 2023-05-02 DIAGNOSIS — R1310 Dysphagia, unspecified: Secondary | ICD-10-CM | POA: Diagnosis not present

## 2023-05-02 DIAGNOSIS — Z888 Allergy status to other drugs, medicaments and biological substances status: Secondary | ICD-10-CM

## 2023-05-02 DIAGNOSIS — Z800 Family history of malignant neoplasm of digestive organs: Secondary | ICD-10-CM

## 2023-05-02 DIAGNOSIS — Z6837 Body mass index (BMI) 37.0-37.9, adult: Secondary | ICD-10-CM | POA: Diagnosis not present

## 2023-05-02 DIAGNOSIS — N920 Excessive and frequent menstruation with regular cycle: Secondary | ICD-10-CM | POA: Diagnosis present

## 2023-05-02 DIAGNOSIS — I10 Essential (primary) hypertension: Secondary | ICD-10-CM | POA: Diagnosis present

## 2023-05-02 DIAGNOSIS — K222 Esophageal obstruction: Secondary | ICD-10-CM

## 2023-05-02 DIAGNOSIS — Z87898 Personal history of other specified conditions: Secondary | ICD-10-CM

## 2023-05-02 DIAGNOSIS — N3010 Interstitial cystitis (chronic) without hematuria: Secondary | ICD-10-CM | POA: Diagnosis present

## 2023-05-02 DIAGNOSIS — Z86711 Personal history of pulmonary embolism: Secondary | ICD-10-CM | POA: Diagnosis present

## 2023-05-02 DIAGNOSIS — F411 Generalized anxiety disorder: Secondary | ICD-10-CM | POA: Diagnosis present

## 2023-05-02 DIAGNOSIS — K2100 Gastro-esophageal reflux disease with esophagitis, without bleeding: Secondary | ICD-10-CM | POA: Diagnosis present

## 2023-05-02 DIAGNOSIS — K2970 Gastritis, unspecified, without bleeding: Secondary | ICD-10-CM | POA: Diagnosis present

## 2023-05-02 DIAGNOSIS — G8929 Other chronic pain: Secondary | ICD-10-CM | POA: Diagnosis present

## 2023-05-02 DIAGNOSIS — Z803 Family history of malignant neoplasm of breast: Secondary | ICD-10-CM

## 2023-05-02 DIAGNOSIS — E66813 Obesity, class 3: Secondary | ICD-10-CM | POA: Diagnosis present

## 2023-05-02 DIAGNOSIS — E282 Polycystic ovarian syndrome: Secondary | ICD-10-CM | POA: Diagnosis present

## 2023-05-02 DIAGNOSIS — R1013 Epigastric pain: Secondary | ICD-10-CM | POA: Diagnosis not present

## 2023-05-02 DIAGNOSIS — M797 Fibromyalgia: Secondary | ICD-10-CM | POA: Diagnosis present

## 2023-05-02 DIAGNOSIS — E039 Hypothyroidism, unspecified: Secondary | ICD-10-CM | POA: Diagnosis present

## 2023-05-02 DIAGNOSIS — B3781 Candidal esophagitis: Secondary | ICD-10-CM | POA: Diagnosis present

## 2023-05-02 DIAGNOSIS — K224 Dyskinesia of esophagus: Secondary | ICD-10-CM | POA: Diagnosis present

## 2023-05-02 DIAGNOSIS — Z8679 Personal history of other diseases of the circulatory system: Secondary | ICD-10-CM | POA: Diagnosis not present

## 2023-05-02 DIAGNOSIS — Z7982 Long term (current) use of aspirin: Secondary | ICD-10-CM

## 2023-05-02 DIAGNOSIS — G4700 Insomnia, unspecified: Secondary | ICD-10-CM | POA: Diagnosis present

## 2023-05-02 DIAGNOSIS — R112 Nausea with vomiting, unspecified: Secondary | ICD-10-CM | POA: Diagnosis not present

## 2023-05-02 DIAGNOSIS — Z8711 Personal history of peptic ulcer disease: Secondary | ICD-10-CM

## 2023-05-02 DIAGNOSIS — D649 Anemia, unspecified: Principal | ICD-10-CM

## 2023-05-02 DIAGNOSIS — Z833 Family history of diabetes mellitus: Secondary | ICD-10-CM

## 2023-05-02 DIAGNOSIS — Z8249 Family history of ischemic heart disease and other diseases of the circulatory system: Secondary | ICD-10-CM

## 2023-05-02 LAB — COMPREHENSIVE METABOLIC PANEL
ALT: 15 U/L (ref 0–44)
AST: 17 U/L (ref 15–41)
Albumin: 3.7 g/dL (ref 3.5–5.0)
Alkaline Phosphatase: 91 U/L (ref 38–126)
Anion gap: 9 (ref 5–15)
BUN: 11 mg/dL (ref 6–20)
CO2: 25 mmol/L (ref 22–32)
Calcium: 9.4 mg/dL (ref 8.9–10.3)
Chloride: 98 mmol/L (ref 98–111)
Creatinine, Ser: 1.63 mg/dL — ABNORMAL HIGH (ref 0.44–1.00)
GFR, Estimated: 38 mL/min — ABNORMAL LOW (ref >60)
Glucose, Bld: 126 mg/dL — ABNORMAL HIGH (ref 70–99)
Potassium: 3.4 mmol/L — ABNORMAL LOW (ref 3.5–5.1)
Sodium: 132 mmol/L — ABNORMAL LOW (ref 135–145)
Total Bilirubin: 0.4 mg/dL (ref 0.0–1.2)
Total Protein: 8.4 g/dL — ABNORMAL HIGH (ref 6.5–8.1)

## 2023-05-02 LAB — CBC WITH DIFFERENTIAL/PLATELET
Abs Immature Granulocytes: 0.13 10*3/uL — ABNORMAL HIGH (ref 0.00–0.07)
Basophils Absolute: 0 10*3/uL (ref 0.0–0.1)
Basophils Relative: 0 %
Eosinophils Absolute: 0 10*3/uL (ref 0.0–0.5)
Eosinophils Relative: 0 %
HCT: 28.4 % — ABNORMAL LOW (ref 36.0–46.0)
Hemoglobin: 8.2 g/dL — ABNORMAL LOW (ref 12.0–15.0)
Immature Granulocytes: 1 %
Lymphocytes Relative: 16 %
Lymphs Abs: 2 10*3/uL (ref 0.7–4.0)
MCH: 17.6 pg — ABNORMAL LOW (ref 26.0–34.0)
MCHC: 28.9 g/dL — ABNORMAL LOW (ref 30.0–36.0)
MCV: 60.9 fL — ABNORMAL LOW (ref 80.0–100.0)
Monocytes Absolute: 0.9 10*3/uL (ref 0.1–1.0)
Monocytes Relative: 7 %
Neutro Abs: 10.1 10*3/uL — ABNORMAL HIGH (ref 1.7–7.7)
Neutrophils Relative %: 76 %
Platelets: 479 10*3/uL — ABNORMAL HIGH (ref 150–400)
RBC: 4.66 MIL/uL (ref 3.87–5.11)
RDW: 22.8 % — ABNORMAL HIGH (ref 11.5–15.5)
Smear Review: NORMAL
WBC: 13.2 10*3/uL — ABNORMAL HIGH (ref 4.0–10.5)
nRBC: 0 % (ref 0.0–0.2)

## 2023-05-02 LAB — TROPONIN I (HIGH SENSITIVITY)
Troponin I (High Sensitivity): 9 ng/L (ref <18)
Troponin I (High Sensitivity): 10 ng/L (ref <18)

## 2023-05-02 MED ORDER — POTASSIUM CHLORIDE 10 MEQ/100ML IV SOLN
10.0000 meq | INTRAVENOUS | Status: AC
  Administered 2023-05-03 (×2): 10 meq via INTRAVENOUS
  Filled 2023-05-02 (×2): qty 100

## 2023-05-02 MED ORDER — PANTOPRAZOLE SODIUM 40 MG IV SOLR
40.0000 mg | Freq: Every day | INTRAVENOUS | Status: DC
  Administered 2023-05-03 – 2023-05-04 (×3): 40 mg via INTRAVENOUS
  Filled 2023-05-02 (×3): qty 10

## 2023-05-02 MED ORDER — SODIUM CHLORIDE 0.9 % IV SOLN
125.0000 mg | Freq: Once | INTRAVENOUS | Status: AC
  Administered 2023-05-03: 125 mg via INTRAVENOUS
  Filled 2023-05-02: qty 10

## 2023-05-02 MED ORDER — SODIUM CHLORIDE 0.9% FLUSH
3.0000 mL | Freq: Two times a day (BID) | INTRAVENOUS | Status: DC
  Administered 2023-05-03 – 2023-05-05 (×6): 3 mL via INTRAVENOUS

## 2023-05-02 MED ORDER — DOXEPIN HCL 10 MG PO CAPS
20.0000 mg | ORAL_CAPSULE | Freq: Every day | ORAL | Status: DC
  Administered 2023-05-03 – 2023-05-04 (×2): 20 mg via ORAL
  Filled 2023-05-02 (×4): qty 2

## 2023-05-02 MED ORDER — ASPIRIN 325 MG PO TABS
325.0000 mg | ORAL_TABLET | Freq: Every day | ORAL | Status: DC
  Filled 2023-05-02: qty 1

## 2023-05-02 MED ORDER — ACETAMINOPHEN 650 MG RE SUPP: 650.0000 mg | Freq: Four times a day (QID) | RECTAL | Status: DC | PRN

## 2023-05-02 MED ORDER — SODIUM CHLORIDE 0.9% FLUSH: 3.0000 mL | INTRAVENOUS | Status: DC | PRN

## 2023-05-02 MED ORDER — PANTOPRAZOLE SODIUM 40 MG PO TBEC: 40.0000 mg | DELAYED_RELEASE_TABLET | Freq: Two times a day (BID) | ORAL | Status: DC

## 2023-05-02 MED ORDER — VERAPAMIL HCL ER 120 MG PO TBCR
120.0000 mg | EXTENDED_RELEASE_TABLET | Freq: Every day | ORAL | Status: DC
  Administered 2023-05-03: 120 mg via ORAL
  Filled 2023-05-02 (×2): qty 1

## 2023-05-02 MED ORDER — SENNOSIDES-DOCUSATE SODIUM 8.6-50 MG PO TABS: 1.0000 | ORAL_TABLET | Freq: Every evening | ORAL | Status: DC | PRN

## 2023-05-02 MED ORDER — LACTATED RINGERS IV BOLUS
1000.0000 mL | Freq: Once | INTRAVENOUS | Status: AC
  Administered 2023-05-02: 1000 mL via INTRAVENOUS

## 2023-05-02 MED ORDER — FERROUS SULFATE 325 (65 FE) MG PO TABS: 325.0000 mg | ORAL_TABLET | Freq: Every day | ORAL | Status: DC

## 2023-05-02 MED ORDER — SODIUM CHLORIDE 0.9 % IV SOLN: 250.0000 mL | INTRAVENOUS | Status: AC | PRN

## 2023-05-02 MED ORDER — LACTATED RINGERS IV SOLN
INTRAVENOUS | Status: DC
Start: 2023-05-02 — End: 2023-05-03

## 2023-05-02 MED ORDER — ENOXAPARIN SODIUM 40 MG/0.4ML IJ SOSY
40.0000 mg | PREFILLED_SYRINGE | INTRAMUSCULAR | Status: DC
  Administered 2023-05-03 – 2023-05-05 (×3): 40 mg via SUBCUTANEOUS
  Filled 2023-05-02 (×3): qty 0.4

## 2023-05-02 MED ORDER — ACETAMINOPHEN 325 MG PO TABS: 650.0000 mg | ORAL_TABLET | Freq: Four times a day (QID) | ORAL | Status: DC | PRN

## 2023-05-02 MED ORDER — PANTOPRAZOLE SODIUM 40 MG IV SOLR
40.0000 mg | Freq: Every day | INTRAVENOUS | Status: DC
Start: 2023-05-03 — End: 2023-05-02

## 2023-05-02 NOTE — Progress Notes (Signed)
 New Admission Note:  Arrival Method: [facility]  Mental Orientation: Alert and oriented x 4 Telemetry: No orders yet, MD to see Assessment: Completed Skin: Completed, refer to flowsheets IV: Left forearm S.L. Pain: Denies Tubes: None Safety Measures: Safety Fall Prevention Plan was given, discussed and signed. Admission: Completed [facility] Orientation: Patient has been orientated to the room, unit and the staff. Family: None  Orders have been reviewed and implemented. Will continue to monitor the patient. Call light has been placed within reach and bed alarm has been activated.   Bari Lor, RN  Phone Number: [phone]
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
## 2023-05-02 NOTE — ED Notes (Signed)
 RE Paged for Consult @ 19:46

## 2023-05-02 NOTE — Telephone Encounter (Signed)
 Patient stopped by the office after being discharged from the ED in [place] for heart palpitations and also said something is "wrong with her throat". Asked for me to send a message making you aware. CB # [phone] or [phone]

## 2023-05-02 NOTE — ED Notes (Signed)
 [facility] called for transport, NO ETA ED Nurse will call floor to give report Called @ 20:57

## 2023-05-02 NOTE — Progress Notes (Signed)
 Pt just arrived via [facility] from [facility]. RN paged admitting to let them know pt has arrived so we can get orders placed.   Diane [name] Shakai Dolley

## 2023-05-02 NOTE — ED Notes (Signed)
 Pt. Feels like she has something stuck in her throat and has palpitations.  Pt. In no distress.

## 2023-05-02 NOTE — ED Provider Notes (Signed)
 [facility] Provider Note   CSN: [id number] Arrival date & time: 05/02/23  1418     History  Chief Complaint  Patient presents with   Palpitations    Diane [name] is a 53 y.o. female.  HPI Patient reports has been having several symptoms.  For her the most concerning symptom at this time is difficulty with swallowing.  She reports that as she tries to swallow things even liquids they seem to get hung up in her throat.  She indicates that about the level of her sternal notch.  She reports is very difficult to swallow her medications or to eat anything solid.  Reports is also uncomfortable and painful.  She feels like there is a nodule there.  She reports she has had historically some problems with swelling in her glands and saw ENT about 4 weeks ago.  She describes an endoscopic exam.  She reports she was treated with 2 weeks of amoxicillin  but symptoms have not improved.  She reports that she consistently takes PPI.  Patient reports other symptoms that she is having is palpitations and lightheadedness, easy fatigue and some shortness of breath with exertion.  Patient reports that she does have heavy menstrual bleeding although her most recent menses has been fairly light.  We described the patient's anemia, she reports she does have known history of anemia and sees Dr. Timmy.  She reports she used to get iron infusions and take oral iron.  She reports that she has not done that for quite a while.  She reports she got busy with a lot of other things and stopped taking any iron supplements or getting infusions.    Home Medications Prior to Admission medications   Medication Sig Start Date End Date Taking? Authorizing Provider  aspirin  325 MG tablet Take 325 mg by mouth daily.    [provider]  atorvastatin  (LIPITOR) 20 MG tablet Take 1 tablet (20 mg total) by mouth daily. 09/11/22 12/10/22  Pietro Redell RAMAN, MD  buPROPion   (WELLBUTRIN  XL) 150 MG 24 hr tablet Take 1 tablet (150 mg total) by mouth daily. 08/29/16   Alvan Dorothyann BIRCH, MD  doxepin  (SINEQUAN ) 10 MG capsule Take 20 mg by mouth at bedtime. 06/21/22   [provider]  Gabapentin , Once-Daily, (GRALISE ) 600 MG TABS Take 1,800 mg by mouth at bedtime.  10/24/15   [provider]  naproxen  (NAPROSYN ) 375 MG tablet Take 1 tablet (375 mg total) by mouth 2 (two) times daily with a meal. 11/12/21   Palumbo, April, MD  OMEGA-3 FATTY ACIDS PO Take by mouth.    [provider]  ondansetron  (ZOFRAN  ODT) 4 MG disintegrating tablet Take 1 tablet (4 mg total) by mouth every 8 (eight) hours as needed for nausea or vomiting. 09/13/15   Alvan Dorothyann BIRCH, MD  promethazine  (PHENERGAN ) 25 MG tablet Take 1 tablet (25 mg total) by mouth every 6 (six) hours as needed for up to 10 doses for nausea or vomiting. 12/31/18   Curatolo, Adam, DO  SAVELLA 25 MG TABS Take 3 tablets by mouth every morning. 10/30/18   [provider]  verapamil  (CALAN -SR) 120 MG CR tablet Take 1 tablet (120 mg total) by mouth at bedtime. 06/28/22   Pietro Redell RAMAN, MD  WELLBUTRIN  XL 300 MG 24 hr tablet Take 300 mg by mouth daily. 09/27/17   [provider]  ZOMIG  5 MG nasal solution Place 1 spray into the nose daily  as needed for migraine. May repeat in 2 hours if needed. Max of 2 doses in 24 hours 09/11/14   [provider]      Allergies    Topiramate, Lisinopril , Prednisone , Relpax [eletriptan hydrobromide], Eletriptan, and Kiwi extract    Review of Systems   Review of Systems  Physical Exam Updated Vital Signs BP (!) 133/90   Pulse 91   Temp 98.3 F (36.8 C) (Oral)   Resp 16   SpO2 95%  Physical Exam Constitutional:      Comments: Alert nontoxic no acute distress.  No respiratory distress.  HENT:     Head: Normocephalic and atraumatic.     Nose: Nose normal.     Mouth/Throat:     Mouth: Mucous membranes are moist.     Pharynx: Oropharynx is  clear.     Comments: Oral airway is widely patent.  Mucous membranes are in very good condition.  Posterior airway is widely patent and symmetric uvula.  No erythema or swelling.  Dentition is in excellent condition.  Normal range of motion of the jaw. Eyes:     Extraocular Movements: Extraocular movements intact.     Conjunctiva/sclera: Conjunctivae normal.     Pupils: Pupils are equal, round, and reactive to light.  Neck:     Comments: No palpable masses in the neck.  No significant lymphadenopathy.  No thyromegaly.  Patient does endorse discomfort to palpation of the larynx and lower palpable trachea.  No palpable abnormalities.  Voice is clear and no stridor. Cardiovascular:     Rate and Rhythm: Normal rate and regular rhythm.     Comments: 1-2+ systolic murmur.  Heart is regular no ectopy at time of exam. Pulmonary:     Effort: Pulmonary effort is normal.     Breath sounds: Normal breath sounds.  Abdominal:     General: There is no distension.     Palpations: Abdomen is soft.     Tenderness: There is no abdominal tenderness. There is no guarding.  Musculoskeletal:        General: No swelling or tenderness. Normal range of motion.     Right lower leg: No edema.     Left lower leg: No edema.  Skin:    General: Skin is warm and dry.  Neurological:     General: No focal deficit present.     Mental Status: She is oriented to person, place, and time.     Motor: No weakness.     Coordination: Coordination normal.  Psychiatric:        Mood and Affect: Mood normal.     ED Results / Procedures / Treatments   Labs (all labs ordered are listed, but only abnormal results are displayed) Labs Reviewed  CBC WITH DIFFERENTIAL/PLATELET - Abnormal; Notable for the following components:      Result Value   WBC 13.2 (*)    Hemoglobin 8.2 (*)    HCT 28.4 (*)    MCV 60.9 (*)    MCH 17.6 (*)    MCHC 28.9 (*)    RDW 22.8 (*)    Platelets 479 (*)    Neutro Abs 10.1 (*)    Abs Immature  Granulocytes 0.13 (*)    All other components within normal limits  COMPREHENSIVE METABOLIC PANEL - Abnormal; Notable for the following components:   Sodium 132 (*)    Potassium 3.4 (*)    Glucose, Bld 126 (*)    Creatinine, Ser 1.63 (*)  Total Protein 8.4 (*)    GFR, Estimated 38 (*)    All other components within normal limits  TROPONIN I (HIGH SENSITIVITY)  TROPONIN I (HIGH SENSITIVITY)    EKG EKG Interpretation Date/Time:  Thursday May 02 2023 14:27:37 EST Ventricular Rate:  107 PR Interval:  148 QRS Duration:  90 QT Interval:  348 QTC Calculation: 465 R Axis:   -38  Text Interpretation: Sinus tachycardia Consider left atrial enlargement Left axis deviation no ischemic appearance. increased voltage compared to previous Confirmed by Armenta Canning [phone]) on 05/02/2023 4:27:30 PM  Radiology DG Neck Soft Tissue Result Date: 05/02/2023 CLINICAL DATA:  Foreign body sensation, initial encounter EXAM: NECK SOFT TISSUES - 1+ VIEW COMPARISON:  None Available. FINDINGS: Epiglottis and aryepiglottic folds are within normal limits. No prevertebral soft tissue swelling is noted. Mild degenerative changes of the cervical spine are seen. No acute fracture or acute facet abnormality is noted. IMPRESSION: No foreign body noted. Electronically Signed   By: Oneil Devonshire M.D.   On: 05/02/2023 17:23   DG Chest 2 View Result Date: 05/02/2023 CLINICAL DATA:  Cardiac palpitations and feel leading of something stuck in throat, initial encounter EXAM: CHEST - 2 VIEW COMPARISON:  08/07/2018 FINDINGS: The heart size and mediastinal contours are within normal limits. Both lungs are clear. The visualized skeletal structures are unremarkable. IMPRESSION: No active cardiopulmonary disease. Electronically Signed   By: Oneil Devonshire M.D.   On: 05/02/2023 17:22    Procedures Procedures    Medications Ordered in ED Medications  lactated ringers  bolus 1,000 mL (1,000 mLs Intravenous New Bag/Given 05/02/23  1807)    ED Course/ Medical Decision Making/ A&P                                 Medical Decision Making Amount and/or Complexity of Data Reviewed Labs: ordered. Radiology: ordered.  Risk Decision regarding hospitalization.   Patient presents as outlined.  Her focal concern is for dysphagia and difficulty with eating or drinking.  Differential diagnosis includes external compressive neck mass\esophageal stricture or diverticulum\globus sensation\reflux.  Physical exam does not reveal evident neck mass.  Patient does not have stridor or any respiratory difficulty diminishing any probability of compression to the trachea.  Patient is not having any difficulty handling secretions.  She is speaking clearly and swallowing appropriately during speech.  Sitting straight up, she does feel like it is hard to swallow and pauses in needs to take an exaggerated effort to swallow.  At this time we will proceed with a plain film soft tissue neck x-ray.  Patient has decreased GFR today and I do not feel the risk-benefit is in favor of a CT scan given lack of clinical findings on exam.  Patient also recently describes having had an upper airway endoscopy, this seems unlikely that significant mass in the airway has developed in the interim.  Especially with no stridor.  Patient had TSH and T4 results within the last 3-1/2 months that were normal.  White count 13.2 hemoglobin 8.2 platelets 479 sodium 132 potassium 3.4 glucose 126 BUN 11 creatinine 1.6 GFR 38.  This time with anemia patient may be experiencing some of her symptoms of palpitation and exertional fatigue in association with symptomatic anemia.  Will also obtain orthostatic vital signs.  Patient incoming blood pressure is normotensive.  She is mildly tachycardic at 108.  Patient has known history of anemia and prior iron supplementation which  she has not had in some time.  At this time tried trial of Sprite and patient reported gagging and vomited,  nursing observation.  Patient's GFR is decreased from baseline.  Baseline is greater than 60 today is 38 possibly in conjunction with decreased oral intake as described by the patient.  At this time we will plan for admission with poor oral intake AKI and likely symptomatic anemia with dyspnea and palpitations that hemoglobin 8.2.  Consult: Reviewed with Dr. Niels Hurst for admission.       Final Clinical Impression(s) / ED Diagnoses Final diagnoses:  Symptomatic anemia  AKI (acute kidney injury) (HCC)  Dysphagia, unspecified type    Rx / DC Orders ED Discharge Orders     None         Armenta Canning, MD 05/02/23 2041

## 2023-05-02 NOTE — ED Notes (Signed)
 Discussed with EDP - Ok'd to consume water as tolerated. of water provided to patient.

## 2023-05-02 NOTE — ED Triage Notes (Signed)
 Pt states she has been having heart palpitations and also states she has something in her throat for a week or so. Its difficult to eat and drink.

## 2023-05-03 ENCOUNTER — Encounter (HOSPITAL_COMMUNITY)
Admission: EM | Disposition: A | Discharge status: Home / Self Care | Payer: Self-pay | Source: Home / Self Care | Attending: Internal Medicine

## 2023-05-03 ENCOUNTER — Inpatient Hospital Stay (HOSPITAL_COMMUNITY): Payer: Federal, State, Local not specified - PPO | Admitting: Anesthesiology

## 2023-05-03 ENCOUNTER — Encounter (HOSPITAL_COMMUNITY): Payer: Self-pay | Admitting: Internal Medicine

## 2023-05-03 DIAGNOSIS — K222 Esophageal obstruction: Secondary | ICD-10-CM | POA: Diagnosis not present

## 2023-05-03 DIAGNOSIS — B3781 Candidal esophagitis: Secondary | ICD-10-CM

## 2023-05-03 DIAGNOSIS — N179 Acute kidney failure, unspecified: Secondary | ICD-10-CM | POA: Diagnosis not present

## 2023-05-03 DIAGNOSIS — D509 Iron deficiency anemia, unspecified: Secondary | ICD-10-CM | POA: Diagnosis not present

## 2023-05-03 DIAGNOSIS — R1310 Dysphagia, unspecified: Secondary | ICD-10-CM | POA: Diagnosis not present

## 2023-05-03 DIAGNOSIS — R112 Nausea with vomiting, unspecified: Secondary | ICD-10-CM | POA: Diagnosis not present

## 2023-05-03 DIAGNOSIS — R1013 Epigastric pain: Secondary | ICD-10-CM

## 2023-05-03 HISTORY — PX: ESOPHAGOGASTRODUODENOSCOPY (EGD) WITH PROPOFOL: SHX5813

## 2023-05-03 HISTORY — PX: BIOPSY: SHX5522

## 2023-05-03 HISTORY — PX: BALLOON DILATION: SHX5330

## 2023-05-03 LAB — COMPREHENSIVE METABOLIC PANEL
ALT: 11 U/L (ref 0–44)
AST: 14 U/L — ABNORMAL LOW (ref 15–41)
Albumin: 2.8 g/dL — ABNORMAL LOW (ref 3.5–5.0)
Alkaline Phosphatase: 75 U/L (ref 38–126)
Anion gap: 8 (ref 5–15)
BUN: 8 mg/dL (ref 6–20)
CO2: 24 mmol/L (ref 22–32)
Calcium: 8.4 mg/dL — ABNORMAL LOW (ref 8.9–10.3)
Chloride: 104 mmol/L (ref 98–111)
Creatinine, Ser: 1.02 mg/dL — ABNORMAL HIGH (ref 0.44–1.00)
GFR, Estimated: >60 mL/min (ref >60)
Glucose, Bld: 105 mg/dL — ABNORMAL HIGH (ref 70–99)
Potassium: 3.7 mmol/L (ref 3.5–5.1)
Sodium: 136 mmol/L (ref 135–145)
Total Bilirubin: 0.3 mg/dL (ref 0.0–1.2)
Total Protein: 6.4 g/dL — ABNORMAL LOW (ref 6.5–8.1)

## 2023-05-03 LAB — RETICULOCYTES
Immature Retic Fract: 25.6 % — ABNORMAL HIGH (ref 2.3–15.9)
RBC.: 4.23 MIL/uL (ref 3.87–5.11)
Retic Count, Absolute: 52.9 10*3/uL (ref 19.0–186.0)
Retic Ct Pct: 1.3 % (ref 0.4–3.1)

## 2023-05-03 LAB — CBC
HCT: 22.8 % — ABNORMAL LOW (ref 36.0–46.0)
Hemoglobin: 6.5 g/dL — CL (ref 12.0–15.0)
MCH: 17.6 pg — ABNORMAL LOW (ref 26.0–34.0)
MCHC: 28.5 g/dL — ABNORMAL LOW (ref 30.0–36.0)
MCV: 61.6 fL — ABNORMAL LOW (ref 80.0–100.0)
Platelets: 366 10*3/uL (ref 150–400)
RBC: 3.7 MIL/uL — ABNORMAL LOW (ref 3.87–5.11)
RDW: 22.3 % — ABNORMAL HIGH (ref 11.5–15.5)
WBC: 10.2 10*3/uL (ref 4.0–10.5)
nRBC: 0 % (ref 0.0–0.2)

## 2023-05-03 LAB — IRON AND TIBC
Iron: 17 ug/dL — ABNORMAL LOW (ref 28–170)
Saturation Ratios: 3 % — ABNORMAL LOW (ref 10.4–31.8)
TIBC: 524 ug/dL — ABNORMAL HIGH (ref 250–450)
UIBC: 507 ug/dL

## 2023-05-03 LAB — VITAMIN B12: Vitamin B-12: 3998 pg/mL — ABNORMAL HIGH (ref 180–914)

## 2023-05-03 LAB — HEMOGLOBIN AND HEMATOCRIT, BLOOD
HCT: 26.5 % — ABNORMAL LOW (ref 36.0–46.0)
Hemoglobin: 7.6 g/dL — ABNORMAL LOW (ref 12.0–15.0)

## 2023-05-03 LAB — FOLATE: Folate: 14.7 ng/mL (ref >5.9)

## 2023-05-03 LAB — MAGNESIUM: Magnesium: 1.8 mg/dL (ref 1.7–2.4)

## 2023-05-03 LAB — OSMOLALITY: Osmolality: 294 mosm/kg (ref 275–295)

## 2023-05-03 LAB — FERRITIN: Ferritin: 4 ng/mL — ABNORMAL LOW (ref 11–307)

## 2023-05-03 LAB — PREPARE RBC (CROSSMATCH)

## 2023-05-03 LAB — ABO/RH: ABO/RH(D): O POS

## 2023-05-03 SURGERY — ESOPHAGOGASTRODUODENOSCOPY (EGD) WITH PROPOFOL
Anesthesia: Monitor Anesthesia Care

## 2023-05-03 MED ORDER — SODIUM CHLORIDE 0.9% IV SOLUTION: Freq: Once | INTRAVENOUS | Status: AC

## 2023-05-03 MED ORDER — SODIUM CHLORIDE 0.9% IV SOLUTION: Freq: Once | INTRAVENOUS | Status: DC

## 2023-05-03 MED ORDER — PROPOFOL 10 MG/ML IV BOLUS
INTRAVENOUS | Status: DC | PRN
  Administered 2023-05-03: 50 mg via INTRAVENOUS
  Administered 2023-05-03: 70 mg via INTRAVENOUS
  Administered 2023-05-03: 50 mg via INTRAVENOUS
  Administered 2023-05-03: 80 mg via INTRAVENOUS
  Administered 2023-05-03: 100 mg via INTRAVENOUS
  Administered 2023-05-03: 50 mg via INTRAVENOUS

## 2023-05-03 MED ORDER — PANTOPRAZOLE SODIUM 40 MG IV SOLR: 40.0000 mg | INTRAVENOUS | Status: DC

## 2023-05-03 MED ORDER — LABETALOL HCL 5 MG/ML IV SOLN
10.0000 mg | INTRAVENOUS | Status: DC | PRN
  Administered 2023-05-03: 10 mg via INTRAVENOUS
  Filled 2023-05-03: qty 4

## 2023-05-03 MED ORDER — FLUCONAZOLE 40 MG/ML PO SUSR
200.0000 mg | Freq: Every day | ORAL | Status: DC
  Administered 2023-05-03 – 2023-05-05 (×3): 200 mg via ORAL
  Filled 2023-05-03 (×3): qty 5

## 2023-05-03 MED ORDER — LIDOCAINE 2% (20 MG/ML) 5 ML SYRINGE
INTRAMUSCULAR | Status: DC | PRN
  Administered 2023-05-03: 100 mg via INTRAVENOUS

## 2023-05-03 MED ORDER — MELATONIN 5 MG PO TABS
5.0000 mg | ORAL_TABLET | Freq: Every evening | ORAL | Status: DC | PRN
  Administered 2023-05-03 – 2023-05-04 (×2): 5 mg via ORAL
  Filled 2023-05-03 (×2): qty 1

## 2023-05-03 MED ORDER — LACTATED RINGERS IV SOLN: INTRAVENOUS | Status: DC | PRN

## 2023-05-03 MED ORDER — HYDROXYZINE HCL 50 MG/ML IM SOLN
50.0000 mg | Freq: Three times a day (TID) | INTRAMUSCULAR | Status: DC | PRN
  Administered 2023-05-03 – 2023-05-04 (×3): 50 mg via INTRAMUSCULAR
  Filled 2023-05-03 (×4): qty 1

## 2023-05-03 MED ORDER — PROCHLORPERAZINE EDISYLATE 10 MG/2ML IJ SOLN
5.0000 mg | Freq: Four times a day (QID) | INTRAMUSCULAR | Status: DC | PRN
  Administered 2023-05-03 – 2023-05-05 (×6): 5 mg via INTRAVENOUS
  Filled 2023-05-03 (×6): qty 2

## 2023-05-03 SURGICAL SUPPLY — 14 items

## 2023-05-03 NOTE — Progress Notes (Signed)
 PROGRESS NOTE  Diane [name]  FMW:[id number] DOB: March 12, 1971 DOA: 05/02/2023 PCP: Ilah Crigler, MD   Brief Narrative: Patient is a 53 year old female with history of hypertension, chronic palpitations, GAD, antiphospholipid syndrome, chronic low back pain, fibromyalgia, GERD, hypothyroidism, disability status, PE, rheumatoid arthritis, history of esophagitis who presented with multiple complaints including dysphagia, poor oral intake, palpitations.  Reported that she had sensation of food stuck in her throat and food pipe.  Also reports bloating, dyspepsia.  No report of melena, hematochezia.  On presentation, she was mildly hypertensive.  EKG showed sinus tachycardia.  Lab work showed WC count of 13.2, hemoglobin 8.2, potassium 3.4, creatinine 1.6.  Patient admitted for the further evaluation of dysphagia, anemia, dysphagia.  GI consulted.  Assessment & Plan:  Principal Problem:   AKI (acute kidney injury) (HCC) Active Problems:   Hypothyroidism   GAD (generalized anxiety disorder)   Interstitial cystitis   Fibromyalgia   GERD (gastroesophageal reflux disease)   History of pulmonary embolism   Hypokalemia   History of dysphagia   History of sinus tachycardia   Microcytic anemia   History of antiphospholipid syndrome   Hyponatremia   Normocytic anemia/iron deficiency: Her baseline hemoglobin is around 9.5.  No history of melena or hematochezia.  Will get FOBT.  Hemoglobin down to the range of 6 today being transfused with a unit of PRBC.  GI will be consulted.  Continue PPI IV.  Iron panel showed low iron, given IV iron.  Continue oral iron supplements on discharge.  History of menorrhagia but not that severe.  But could not rule out GI bleed.  Dysphagia: Complain as above. previously following with [facility].  Recently seen by GI at [facility] on January 24 and was diagnosed with GERD, dyspepsia and was recommended PPI.  Last EGD in 2016 showed normal findings.  Given history  of dysphagia and acute blood loss anemia, will consult GI.  Speech therapy also consulted.  No history of weight loss  AKI: Baseline creatinine normal.  Presented with creatinine of 1.6.  Continue IV fluids.  Monitor kidney function, avoid nephrotoxins.  AKI resolved.  Hypokalemia: Supplemented with potassium and corrected  Generalized anxiety disorder: On Vistaril   History of PVCs/sinus tachycardia: Patient was seen by cardiology in the past.  Outpatient Holter monitor showed PVCs, PAC.  Echo done in April 2024 showed normal EF, normal diastolic parameter.  Continue verapamil   Chest pain: Low suspicion for cardiac etiology.  Troponin negative.  Hypertension: On Verapamil .  GAD: Taking doxepin   History of antiphospholipid syndrome/PE in 2012: Was taking warfarin, Xarelto in the past.  Currently on aspirin .  Will hold.          DVT prophylaxis:enoxaparin  (LOVENOX ) injection 40 mg Start: 05/03/23 1000 SCDs Start: 05/02/23 2242 Place TED hose Start: 05/02/23 2242     Code Status: Full Code  Family Communication: Father at bedside on 1/10  Patient status:Inpatient  Patient is from :home  Anticipated discharge un:ynfz  Estimated DC date: After full workup   Consultants: GI  Procedures:None  Antimicrobials:  Anti-infectives (From admission, onward)    None       Subjective: Patient seen and examined at bedside today.  Appears very comfortable.  Lying on bed.  Not in any kind of distress.  Very concerned about her multiple complaints including not able to eat, difficulty swallowing, low hemoglobin, chest pain, abdominal pain etc. no report of hematochezia or melena.  She does not remember when was  her last bowel movement.  Denies any significant menorrhagia  Objective: Vitals:   05/02/23 2030 05/02/23 2125 05/02/23 2210 05/03/23 0450  BP: 128/88 139/75 (!) 150/81 (!) 151/92  Pulse: 89 89 99 91  Resp: 18 18  15   Temp: 98.2 F (36.8 C)  98.2 F (36.8 C) 97.9  F (36.6 C)  TempSrc:   Oral Oral  SpO2: 96% 96% 100% 97%    Intake/Output Summary (Last 24 hours) at 05/03/2023 0741 Last data filed at 05/03/2023 0600 Gross per 24 hour  Intake 0 ml  Output --  Net 0 ml   There were no vitals filed for this visit.  Examination:  General exam: Overall comfortable, not in distress, morbidly obese HEENT: PERRL Respiratory system:  no wheezes or crackles  Cardiovascular system: S1 & S2 heard, RRR.  Gastrointestinal system: Abdomen is nondistended, soft and nontender. Central nervous system: Alert and oriented Extremities: No edema, no clubbing ,no cyanosis Skin: No rashes, no ulcers,no icterus     Data Reviewed: I have personally reviewed following labs and imaging studies  CBC: Recent Labs  Lab 05/02/23 1429 05/03/23 0658  WBC 13.2* 10.2  NEUTROABS 10.1*  --   HGB 8.2* 6.5*  HCT 28.4* 22.8*  MCV 60.9* 61.6*  PLT 479* 366   Basic Metabolic Panel: Recent Labs  Lab 05/02/23 1429 05/02/23 2321  NA 132*  --   K 3.4*  --   CL 98  --   CO2 25  --   GLUCOSE 126*  --   BUN 11  --   CREATININE 1.63*  --   CALCIUM  9.4  --   MG  --  1.8     No results found for this or any previous visit (from the past 240 hours).   Radiology Studies: DG Neck Soft Tissue Result Date: 05/02/2023 CLINICAL DATA:  Foreign body sensation, initial encounter EXAM: NECK SOFT TISSUES - 1+ VIEW COMPARISON:  None Available. FINDINGS: Epiglottis and aryepiglottic folds are within normal limits. No prevertebral soft tissue swelling is noted. Mild degenerative changes of the cervical spine are seen. No acute fracture or acute facet abnormality is noted. IMPRESSION: No foreign body noted. Electronically Signed   By: Oneil Devonshire M.D.   On: 05/02/2023 17:23   DG Chest 2 View Result Date: 05/02/2023 CLINICAL DATA:  Cardiac palpitations and feel leading of something stuck in throat, initial encounter EXAM: CHEST - 2 VIEW COMPARISON:  08/07/2018 FINDINGS: The heart size  and mediastinal contours are within normal limits. Both lungs are clear. The visualized skeletal structures are unremarkable. IMPRESSION: No active cardiopulmonary disease. Electronically Signed   By: Oneil Devonshire M.D.   On: 05/02/2023 17:22    Scheduled Meds:  sodium chloride    Intravenous Once   aspirin   325 mg Oral Daily   doxepin   20 mg Oral QHS   enoxaparin  (LOVENOX ) injection  40 mg Subcutaneous Q24H   ferrous sulfate   325 mg Oral Q breakfast   pantoprazole  (PROTONIX ) IV  40 mg Intravenous Daily   sodium chloride  flush  3 mL Intravenous Q12H   verapamil   120 mg Oral QHS   Continuous Infusions:  sodium chloride      lactated ringers  50 mL/hr at 05/03/23 0020     LOS: 1 day   Ivonne Mustache, MD [facility] P1/01/2024, 7:41 AM
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
## 2023-05-03 NOTE — Transfer of Care (Signed)
 Immediate Anesthesia Transfer of Care Note  Patient: Diane [name]  Procedure(s) Performed: ESOPHAGOGASTRODUODENOSCOPY (EGD) WITH PROPOFOL  BALLOON DILATION BIOPSY  Patient Location: Endoscopy Unit  Anesthesia Type:MAC  Level of Consciousness: awake  Airway & Oxygen Therapy: Patient Spontanous Breathing and Patient connected to face mask oxygen  Post-op Assessment: Report given to RN and Post -op Vital signs reviewed and stable  Post vital signs: Reviewed and stable  Last Vitals:  Vitals Value Taken Time  BP 121/77 05/03/23 1447  Temp    Pulse 112 05/03/23 1448  Resp 25 05/03/23 1448  SpO2 94 % 05/03/23 1448  Vitals shown include unfiled device data.  Last Pain:  Vitals:   05/03/23 1349  TempSrc: Temporal  PainSc:       Patients Stated Pain Goal: 0 (05/02/23 1424)  Complications: No notable events documented.

## 2023-05-03 NOTE — Progress Notes (Addendum)
 Pt called about Potassium burning in IV. RN tried to turn IV rate down, however pt is very emotional at this time and says she just needs a break for a few minutes. Pt wanted IV stopped and stated that she will retry it in just a few minutes that she cannot do it right now. Pt states she may need a new IV. Pt was very tearful. RN told pt that she would give her a few minutes, have IV team check on the IV, and then come back to restart medication at a slower rate that would be more tolerable for her. Pt was okay with this. RN will continue to monitor and retry in just a few minutes.   Bari HERO Robertine Kipper

## 2023-05-03 NOTE — Op Note (Addendum)
 [facility] Patient Name: Diane [name] Procedure Date : 05/03/2023 MRN: [id number] Attending MD: Gustav ALONSO Mcgee , MD, [id number] Date of Birth: 1970/06/21 CSN: [id number] Age: 53 Admit Type: Inpatient Procedure:                Upper GI endoscopy Indications:              Dysphagia, Odynophagia Providers:                Gustav ALONSO Mcgee, MD, Darleene Bare, RN, Lorrayne Kitty, Technician Referring MD:              Medicines:                Monitored Anesthesia Care Complications:            No immediate complications. Estimated Blood Loss:     Estimated blood loss was minimal. Procedure:                Pre-Anesthesia Assessment:                           - Prior to the procedure, a History and Physical                            was performed, and patient medications and                            allergies were reviewed. The patient's tolerance of                            previous anesthesia was also reviewed. The risks                            and benefits of the procedure and the sedation                            options and risks were discussed with the patient.                            All questions were answered, and informed consent                            was obtained. Prior Anticoagulants: The patient has                            taken no anticoagulant or antiplatelet agents. ASA                            Grade Assessment: III - A patient with severe                            systemic disease. After reviewing the risks and  benefits, the patient was deemed in satisfactory                            condition to undergo the procedure.                           After obtaining informed consent, the endoscope was                            passed under direct vision. Throughout the                            procedure, the patient's blood pressure, pulse, and                             oxygen saturations were monitored continuously. The                            [id number]-H190 ([id number]) Olympus endoscope was introduced                            through the mouth, and advanced to the second part                            of duodenum. The upper GI endoscopy was                            accomplished without difficulty. The patient                            tolerated the procedure well. Scope In: Scope Out: Findings:      LA Grade D (one or more mucosal breaks involving at least 75% of       esophageal circumference) esophagitis with no bleeding was found 25 to       40 cm from the incisors. Biopsies were taken with a cold forceps for       histology.      One benign-appearing, intrinsic moderate (circumferential scarring or       stenosis; an endoscope may pass) stenosis was found 38 to 40 cm from the       incisors. This stenosis measured 1.7 cm (inner diameter). The stenosis       was traversed. A TTS dilator was passed through the scope. Dilation with       an 18-19-20 mm x 8 cm CRE balloon dilator was performed to 20 mm. The       dilation site was examined following endoscope reinsertion and showed       mild mucosal disruption.      The stomach was normal.      The cardia and gastric fundus were normal on retroflexion.      The examined duodenum was normal. Impression:               - LA Grade D candidiasis esophagitis with no                            bleeding. Biopsied.                           -  Benign-appearing esophageal stenosis. Dilated.                           - Normal stomach.                           - Normal examined duodenum. Recommendation:           - Clear liquid diet today, then advance as                            tolerated to mechanical soft diet.                           - Continue present medications.                           - Use Protonix  (pantoprazole ) 40 mg PO daily.                           - Diflucan  (fluconazole ) 200 mg PO  daily for 3                            weeks.                           - Repeat upper endoscopy in 2 months for follow up                            and repeat esophageal dilation if needed.                           - Ok to DC patient home if she is tolerating                            liquids and Hgb remains stable                           - GI will arrange outpatient GI follow up Procedure Code(s):        --- Professional ---                           [id number], Esophagogastroduodenoscopy, flexible,                            transoral; with transendoscopic balloon dilation of                            esophagus (less than 30 mm diameter)                           43239, 59, Esophagogastroduodenoscopy, flexible,                            transoral; with biopsy, single or multiple Diagnosis Code(s):        --- Professional ---  B37.81, Candidal esophagitis                           K22.2, Esophageal obstruction                           R13.10, Dysphagia, unspecified CPT copyright 2022 American [employer] Association. All rights reserved. The codes documented in this report are preliminary and upon coder review may  be revised to meet current compliance requirements. Siniya Lichty V. Amiri Tritch, MD 05/03/2023 2:53:10 PM This report has been signed electronically. Number of Addenda: 0

## 2023-05-03 NOTE — Plan of Care (Signed)

## 2023-05-03 NOTE — Anesthesia Postprocedure Evaluation (Signed)
 Anesthesia Post Note  Patient: Diane [name]  Procedure(s) Performed: ESOPHAGOGASTRODUODENOSCOPY (EGD) WITH PROPOFOL  BALLOON DILATION BIOPSY     Patient location during evaluation: Endoscopy Anesthesia Type: MAC Level of consciousness: awake and alert Pain management: pain level controlled Vital Signs Assessment: post-procedure vital signs reviewed and stable Respiratory status: spontaneous breathing, nonlabored ventilation and respiratory function stable Cardiovascular status: stable and blood pressure returned to baseline Postop Assessment: no apparent nausea or vomiting Anesthetic complications: no  No notable events documented.  Last Vitals:  Vitals:   05/03/23 1447 05/03/23 1500  BP: 121/77 134/88  Pulse: (!) 112 (!) 110  Resp: (!) 25 19  Temp: 36.7 C   SpO2: 95% 96%    Last Pain:  Vitals:   05/03/23 1500  TempSrc:   PainSc: 0-No pain                 Jerrilynn Mikowski,W. EDMOND

## 2023-05-03 NOTE — Anesthesia Preprocedure Evaluation (Addendum)
 Anesthesia Evaluation  Patient identified by MRN, date of birth, ID band Patient awake    Reviewed: Allergy & Precautions, H&P , NPO status , Patient's Chart, lab work & pertinent test results  Airway Mallampati: III  TM Distance: >3 FB Neck ROM: Full    Dental no notable dental hx. (+) Teeth Intact, Dental Advisory Given   Pulmonary neg pulmonary ROS   Pulmonary exam normal breath sounds clear to auscultation       Cardiovascular hypertension, Pt. on medications  Rhythm:Regular Rate:Normal     Neuro/Psych  Headaches  Anxiety Depression Bipolar Disorder   negative neurological ROS     GI/Hepatic Neg liver ROS, PUD,GERD  Medicated,,  Endo/Other  Hypothyroidism  Class 3 obesity  Renal/GU Renal InsufficiencyRenal disease  negative genitourinary   Musculoskeletal  (+) Arthritis ,  Fibromyalgia -  Abdominal   Peds  Hematology  (+) Blood dyscrasia, anemia   Anesthesia Other Findings   Reproductive/Obstetrics negative OB ROS                             Anesthesia Physical Anesthesia Plan  ASA: 3  Anesthesia Plan: MAC   Post-op Pain Management: Minimal or no pain anticipated   Induction: Intravenous  PONV Risk Score and Plan: 2 and Propofol  infusion  Airway Management Planned: Natural Airway and Simple Face Mask  Additional Equipment:   Intra-op Plan:   Post-operative Plan:   Informed Consent: I have reviewed the patients History and Physical, chart, labs and discussed the procedure including the risks, benefits and alternatives for the proposed anesthesia with the patient or authorized representative who has indicated his/her understanding and acceptance.     Dental advisory given  Plan Discussed with: CRNA  Anesthesia Plan Comments:        Anesthesia Quick Evaluation

## 2023-05-03 NOTE — Progress Notes (Addendum)
 SLP Cancellation Note  Patient Details Name: Diane [name] MRN: [id number] DOB: 10/24/1970   Cancelled treatment:       Reason Eval/Treat Not Completed: Patient at procedure or test/unavailable (Pt is npo for GI referral, potentially endoscopy or DG esophagus, messaged GI MD and RN to request to change MBS to DG esophagram if desired. Will continue efforts.)  [name], MS [facility] SLP Acute Rehab Services Office [phone]  [name] 05/03/2023, 7:28 AM

## 2023-05-04 DIAGNOSIS — N179 Acute kidney failure, unspecified: Secondary | ICD-10-CM | POA: Diagnosis not present

## 2023-05-04 LAB — BASIC METABOLIC PANEL
Anion gap: 7 (ref 5–15)
BUN: 5 mg/dL — ABNORMAL LOW (ref 6–20)
CO2: 26 mmol/L (ref 22–32)
Calcium: 9 mg/dL (ref 8.9–10.3)
Chloride: 103 mmol/L (ref 98–111)
Creatinine, Ser: 1.04 mg/dL — ABNORMAL HIGH (ref 0.44–1.00)
GFR, Estimated: >60 mL/min (ref >60)
Glucose, Bld: 135 mg/dL — ABNORMAL HIGH (ref 70–99)
Potassium: 3.9 mmol/L (ref 3.5–5.1)
Sodium: 136 mmol/L (ref 135–145)

## 2023-05-04 LAB — CBC
HCT: 25.5 % — ABNORMAL LOW (ref 36.0–46.0)
Hemoglobin: 7.5 g/dL — ABNORMAL LOW (ref 12.0–15.0)
MCH: 18.8 pg — ABNORMAL LOW (ref 26.0–34.0)
MCHC: 29.4 g/dL — ABNORMAL LOW (ref 30.0–36.0)
MCV: 63.9 fL — ABNORMAL LOW (ref 80.0–100.0)
Platelets: 331 10*3/uL (ref 150–400)
RBC: 3.99 MIL/uL (ref 3.87–5.11)
RDW: 22.7 % — ABNORMAL HIGH (ref 11.5–15.5)
WBC: 9.6 10*3/uL (ref 4.0–10.5)
nRBC: 0 % (ref 0.0–0.2)

## 2023-05-04 MED ORDER — SODIUM CHLORIDE 0.9 % IV SOLN
12.5000 mg | Freq: Four times a day (QID) | INTRAVENOUS | Status: DC | PRN
  Administered 2023-05-04: 12.5 mg via INTRAVENOUS
  Filled 2023-05-04: qty 12.5

## 2023-05-04 MED ORDER — FERROUS SULFATE 325 (65 FE) MG PO TABS
325.0000 mg | ORAL_TABLET | Freq: Two times a day (BID) | ORAL | Status: DC
  Administered 2023-05-04 – 2023-05-05 (×2): 325 mg via ORAL
  Filled 2023-05-04 (×2): qty 1

## 2023-05-04 MED ORDER — PANTOPRAZOLE SODIUM 40 MG PO TBEC
40.0000 mg | DELAYED_RELEASE_TABLET | Freq: Every day | ORAL | Status: DC
  Administered 2023-05-05: 40 mg via ORAL
  Filled 2023-05-04: qty 1

## 2023-05-04 MED ORDER — VERAPAMIL HCL ER 240 MG PO TBCR
240.0000 mg | EXTENDED_RELEASE_TABLET | Freq: Every day | ORAL | Status: DC
  Administered 2023-05-04 – 2023-05-05 (×2): 240 mg via ORAL
  Filled 2023-05-04 (×2): qty 1

## 2023-05-04 MED ORDER — VERAPAMIL HCL ER 240 MG PO TBCR: 240.0000 mg | EXTENDED_RELEASE_TABLET | Freq: Every day | ORAL | Status: DC

## 2023-05-04 NOTE — Plan of Care (Signed)
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
## 2023-05-04 NOTE — Progress Notes (Signed)
 PROGRESS NOTE  Diane [name]  FMW:[id number] DOB: 10/13/1970 DOA: 05/02/2023 PCP: Ilah Crigler, MD   Brief Narrative: Patient is a 53 year old female with history of hypertension, chronic palpitations, GAD, antiphospholipid syndrome, chronic low back pain, fibromyalgia, GERD, hypothyroidism, disability status, PE, rheumatoid arthritis, history of esophagitis who presented with multiple complaints including dysphagia, poor oral intake, palpitations.  Reported that she had sensation of food stuck in her throat and food pipe.  Also reports bloating, dyspepsia.  No report of melena, hematochezia.  On presentation, she was mildly hypertensive.  EKG showed sinus tachycardia.  Lab work showed WC count of 13.2, hemoglobin 8.2, potassium 3.4, creatinine 1.6.  Patient admitted for the further evaluation of dysphagia, anemia, dysphagia.  GI consulted.S/P EGD.  Waiting for tolerance of full liquid/solid diet before discharge home  Assessment & Plan:  Principal Problem:   AKI (acute kidney injury) (HCC) Active Problems:   Hypothyroidism   GAD (generalized anxiety disorder)   Interstitial cystitis   Fibromyalgia   GERD (gastroesophageal reflux disease)   History of pulmonary embolism   Hypokalemia   History of dysphagia   History of sinus tachycardia   Microcytic anemia   History of antiphospholipid syndrome   Hyponatremia   Dysphagia   Candida esophagitis (HCC)   Esophageal stricture   Normocytic anemia/iron deficiency: Her baseline hemoglobin is around 9.5.  No history of melena or hematochezia.  FOBT not obtained.  Hemoglobin dropped to the range of 6 ,transfused with a unit of PRBC on 1/10.    Iron panel showed low iron, given IV iron.  Continue oral iron supplements on discharge.  History of menorrhagia but not that severe.  EGD did not show any signs/areas  of bleeding.  She follows with hematology, Dr. Timmy in the past.  We recommend to follow-up with him as an  outpatient.  Dysphagia: GI consulted.  Speech therapy also consulted. S/P EGD which showed candidal esophagitis, benign looking history of stenosis, status post dilation.  GI recommended Protonix   daily,Diflucan  200 mg PO daily for 3  weeks.Repeat upper endoscopy in 2 months for follow up and repeat esophageal dilation if needed.  She is still nauseous today.  Diet upgraded to full liquid  AKI: Baseline creatinine normal.  Presented with creatinine of 1.6.  Continue IV fluids.  Monitor kidney function, avoid nephrotoxins.  AKI resolved.  Hypokalemia: Supplemented with potassium and corrected  Generalized anxiety disorder: On Vistaril   History of PVCs/sinus tachycardia/HTN: Patient was seen by cardiology in the past.  Outpatient Holter monitor showed PVCs, PAC.  Echo done in April 2024 showed normal EF, normal diastolic parameter.  Continue verapamil .  Patient is also hypertensive.  Increase the dose of metformin.  Might need to add other medications if continues to remain hypertensive.  Chest pain: Low suspicion for cardiac etiology.  Troponin negative.  GAD: Taking doxepin   History of antiphospholipid syndrome/PE in 2012: Was taking warfarin, Xarelto in the past.  Currently on aspirin .  On hold, resume on discharge  Obesity: BMI of 37.3         DVT prophylaxis:enoxaparin  (LOVENOX ) injection 40 mg Start: 05/03/23 1000 SCDs Start: 05/02/23 2242 Place TED hose Start: 05/02/23 2242     Code Status: Full Code  Family Communication: Father at bedside on 1/10  Patient status:Inpatient  Patient is from :home  Anticipated discharge un:ynfz  Estimated DC date: Likely tomorrow.  Waiting for tolerance of diet   Consultants: GI  Procedures: EGD  Antimicrobials:  Anti-infectives (From admission, onward)  Start     Dose/Rate Route Frequency Ordered Stop   05/03/23 1500  fluconazole  (DIFLUCAN ) 40 MG/ML suspension 200 mg        200 mg Oral Daily 05/03/23 1454 05/24/23 0959        Subjective: Patient seen and examined at bedside today.  Overall looks comfortable.  Denies any nausea or vomiting this morning but when she tried to eat food, she developed nausea and vomited.  Denies any abdominal pain, hematochezia or melena.  Objective: Vitals:   05/03/23 2050 05/03/23 2303 05/04/23 0659 05/04/23 0727  BP: (!) 160/94 (!) 149/88 (!) 165/93 (!) 180/92  Pulse: 77 75  78  Resp:  (!) 21  18  Temp: 99 F (37.2 C) 98.7 F (37.1 C) 98.1 F (36.7 C) 98.1 F (36.7 C)  TempSrc: Oral Oral Oral Oral  SpO2: 100% 100% 99% 99%  Weight:      Height:        Intake/Output Summary (Last 24 hours) at 05/04/2023 1050 Last data filed at 05/04/2023 0400 Gross per 24 hour  Intake 890 ml  Output 100 ml  Net 790 ml   Filed Weights   05/03/23 1313  Weight: 98.6 kg    Examination:   General exam: Overall comfortable, not in distress,obese HEENT: PERRL Respiratory system:  no wheezes or crackles  Cardiovascular system: S1 & S2 heard, RRR.  Gastrointestinal system: Abdomen is nondistended, soft and nontender. Central nervous system: Alert and oriented Extremities: No edema, no clubbing ,no cyanosis Skin: No rashes, no ulcers,no icterus     Data Reviewed: I have personally reviewed following labs and imaging studies  CBC: Recent Labs  Lab 05/02/23 1429 05/03/23 0658 05/03/23 1710 05/04/23 0516  WBC 13.2* 10.2  --  9.6  NEUTROABS 10.1*  --   --   --   HGB 8.2* 6.5* 7.6* 7.5*  HCT 28.4* 22.8* 26.5* 25.5*  MCV 60.9* 61.6*  --  63.9*  PLT 479* 366  --  331   Basic Metabolic Panel: Recent Labs  Lab 05/02/23 1429 05/02/23 2321 05/03/23 0658 05/04/23 0516  NA 132*  --  136 136  K 3.4*  --  3.7 3.9  CL 98  --  104 103  CO2 25  --  24 26  GLUCOSE 126*  --  105* 135*  BUN 11  --  8 5*  CREATININE 1.63*  --  1.02* 1.04*  CALCIUM  9.4  --  8.4* 9.0  MG  --  1.8  --   --      No results found for this or any previous visit (from the past 240 hours).    Radiology Studies: DG Neck Soft Tissue Result Date: 05/02/2023 CLINICAL DATA:  Foreign body sensation, initial encounter EXAM: NECK SOFT TISSUES - 1+ VIEW COMPARISON:  None Available. FINDINGS: Epiglottis and aryepiglottic folds are within normal limits. No prevertebral soft tissue swelling is noted. Mild degenerative changes of the cervical spine are seen. No acute fracture or acute facet abnormality is noted. IMPRESSION: No foreign body noted. Electronically Signed   By: Oneil Devonshire M.D.   On: 05/02/2023 17:23   DG Chest 2 View Result Date: 05/02/2023 CLINICAL DATA:  Cardiac palpitations and feel leading of something stuck in throat, initial encounter EXAM: CHEST - 2 VIEW COMPARISON:  08/07/2018 FINDINGS: The heart size and mediastinal contours are within normal limits. Both lungs are clear. The visualized skeletal structures are unremarkable. IMPRESSION: No active cardiopulmonary disease. Electronically Signed   By:  Oneil Devonshire M.D.   On: 05/02/2023 17:22    Scheduled Meds:  sodium chloride    Intravenous Once   doxepin   20 mg Oral QHS   enoxaparin  (LOVENOX ) injection  40 mg Subcutaneous Q24H   ferrous sulfate   325 mg Oral BID WC   fluconazole   200 mg Oral Daily   [START ON 05/05/2023] pantoprazole   40 mg Oral Daily   sodium chloride  flush  3 mL Intravenous Q12H   verapamil   240 mg Oral Daily   Continuous Infusions:  promethazine  (PHENERGAN ) injection (IM or IVPB) 12.5 mg (05/04/23 1021)     LOS: 2 days   Ivonne Mustache, MD [facility] P1/02/2024, 10:50 AM

## 2023-05-04 NOTE — Progress Notes (Addendum)
 Progress Note   LOS: 2 days   Chief Complaint: Dysphagia   Subjective   Patient states her dysphagia is better.  Lying comfortable in hospital bed.  Tolerating diet.   Objective   Vital signs in last 24 hours: Temp:  [97.5 F (36.4 C)-99 F (37.2 C)] 98.1 F (36.7 C) (01/11 0727) Pulse Rate:  [75-112] 78 (01/11 0727) Resp:  [16-25] 18 (01/11 0727) BP: (121-180)/(77-99) 180/92 (01/11 0727) SpO2:  [94 %-100 %] 99 % (01/11 0727) Weight:  [98.6 kg] 98.6 kg (01/10 1313)   Last BM recorded by nurses in past 5 days No data recorded  General:   female in no acute distress  Heart:  Regular rate and rhythm; no murmurs Pulm: Clear anteriorly; no wheezing Abdomen: soft, nondistended, normal bowel sounds in all quadrants. Nontender without guarding. No organomegaly appreciated. Extremities:  No edema Neurologic:  Alert and  oriented x4;  No focal deficits.  Psych:  Cooperative. Normal mood and affect.  Intake/Output from previous day: 01/10 0701 - 01/11 0700 In: 890 [I.V.:600; Blood:290] Out: 100 [Urine:100] Intake/Output this shift: No intake/output data recorded.  Studies/Results: DG Neck Soft Tissue Result Date: 05/02/2023 CLINICAL DATA:  Foreign body sensation, initial encounter EXAM: NECK SOFT TISSUES - 1+ VIEW COMPARISON:  None Available. FINDINGS: Epiglottis and aryepiglottic folds are within normal limits. No prevertebral soft tissue swelling is noted. Mild degenerative changes of the cervical spine are seen. No acute fracture or acute facet abnormality is noted. IMPRESSION: No foreign body noted. Electronically Signed   By: Oneil Devonshire M.D.   On: 05/02/2023 17:23   DG Chest 2 View Result Date: 05/02/2023 CLINICAL DATA:  Cardiac palpitations and feel leading of something stuck in throat, initial encounter EXAM: CHEST - 2 VIEW COMPARISON:  08/07/2018 FINDINGS: The heart size and mediastinal contours are within normal limits. Both lungs are clear. The visualized skeletal  structures are unremarkable. IMPRESSION: No active cardiopulmonary disease. Electronically Signed   By: Oneil Devonshire M.D.   On: 05/02/2023 17:22    Lab Results: Recent Labs    05/02/23 1429 05/03/23 0658 05/03/23 1710 05/04/23 0516  WBC 13.2* 10.2  --  9.6  HGB 8.2* 6.5* 7.6* 7.5*  HCT 28.4* 22.8* 26.5* 25.5*  PLT 479* 366  --  331   BMET Recent Labs    05/02/23 1429 05/03/23 0658 05/04/23 0516  NA 132* 136 136  K 3.4* 3.7 3.9  CL 98 104 103  CO2 25 24 26   GLUCOSE 126* 105* 135*  BUN 11 8 5*  CREATININE 1.63* 1.02* 1.04*  CALCIUM  9.4 8.4* 9.0   LFT Recent Labs    05/03/23 0658  PROT 6.4*  ALBUMIN 2.8*  AST 14*  ALT 11  ALKPHOS 75  BILITOT 0.3   PT/INR No results for input(s): LABPROT, INR in the last 72 hours.   Scheduled Meds: . sodium chloride    Intravenous Once  . doxepin   20 mg Oral QHS  . enoxaparin  (LOVENOX ) injection  40 mg Subcutaneous Q24H  . ferrous sulfate   325 mg Oral BID WC  . fluconazole   200 mg Oral Daily  . [START ON 05/05/2023] pantoprazole   40 mg Oral Daily  . sodium chloride  flush  3 mL Intravenous Q12H  . verapamil   240 mg Oral Daily   Continuous Infusions: . promethazine  (PHENERGAN ) injection (IM or IVPB) 12.5 mg (05/04/23 1021)      Patient profile:   53 y.o. female with past medical history significant for essential  hypertension, chronic palpitation, generalized anxiety disorder, antiphospholipid syndrome, chronic lower back pain, history of chronic headache, fibromyalgia, GERD, hypothyroidism, interstitial cystitis, migraine, polycystic ovarian syndrome, pulmonary embolism 01/2011, rheumatoid arthritis, chronic iron deficiency anemia follows hematology, history of esophagitis presents for evaluation of chronic dysphagia.    Impression:   Chronic dysphagia Dyspepsia Epigastric pain Ultrasound of neck negative for foreign body Dysphagia ongoing for one year with addition of vomiting occurs acutely over the last 3 days.  Worse with liquids. No improvement on PPI though she does note dyspepsia.  EGD 1/10 showed LA grade D. Candidus esophagitis with no bleeding.  Biopsied.  Benign-appearing esophageal stenosis-dilated.  Normal stomach.  Normal duodenum - Continue to advance diet as tolerated - Continue pantoprazole  40 Mg p.o. daily - Continue Diflucan  200 Mg p.o. daily for 3 weeks - Repeat upper endoscopy in 2 months for follow-up and possible repeat esophageal dilation - We will schedule follow-up in our office as an outpatient  Iron deficiency anemia Hgb 7.5 s/p 1 unit PRBCs.  9.2 1-year ago.  Iron 17, TIBC 524, saturation 3% Ferritin 4 Colonoscopy in 2011 was normal Last seen by hematology in 2020 -- Consider colonoscopy as an outpatient during follow-up when scheduling repeat EGD for further evaluation of IDA - Continue daily CBC and transfuse as needed to maintain HGB > 7    Bayley M McMichael  05/04/2023, 12:33 PM   Attending physician's note   I have taken a history, reviewed the chart and examined the patient. I performed a substantive portion of this encounter, including complete performance of at least one of the key components, in conjunction with the APP. I agree with the APP's note, impression and recommendations.    Distal esophageal stricture dilated to 20 mm Severe Candida esophagitis continue Diflucan  for 21-day course Advance diet as tolerated Continue PPI daily  Follow-up in GI office in 4 to 6 weeks, plan for repeat EGD as outpatient after office follow-up.  She will also need colonoscopy for further evaluation of severe iron deficiency anemia, can be done as outpatient.  Inpatient GI team signing off, available for any questions  The patient was provided an opportunity to ask questions and all were answered. The patient agreed with the plan and demonstrated an understanding of the instructions.   LOIS Wilkie Mcgee , MD [phone]

## 2023-05-05 DIAGNOSIS — N179 Acute kidney failure, unspecified: Secondary | ICD-10-CM | POA: Diagnosis not present

## 2023-05-05 LAB — CBC
HCT: 26.8 % — ABNORMAL LOW (ref 36.0–46.0)
Hemoglobin: 7.9 g/dL — ABNORMAL LOW (ref 12.0–15.0)
MCH: 18.8 pg — ABNORMAL LOW (ref 26.0–34.0)
MCHC: 29.5 g/dL — ABNORMAL LOW (ref 30.0–36.0)
MCV: 63.7 fL — ABNORMAL LOW (ref 80.0–100.0)
Platelets: 348 10*3/uL (ref 150–400)
RBC: 4.21 MIL/uL (ref 3.87–5.11)
RDW: 23.5 % — ABNORMAL HIGH (ref 11.5–15.5)
WBC: 9.5 10*3/uL (ref 4.0–10.5)
nRBC: 0 % (ref 0.0–0.2)

## 2023-05-05 MED ORDER — HYDROXYZINE PAMOATE 50 MG PO CAPS: 50.0000 mg | ORAL_CAPSULE | Freq: Three times a day (TID) | ORAL | Status: AC | PRN

## 2023-05-05 MED ORDER — PANTOPRAZOLE SODIUM 40 MG PO TBEC: 40.0000 mg | DELAYED_RELEASE_TABLET | Freq: Every day | ORAL | 0 refills | Status: AC

## 2023-05-05 MED ORDER — FLUCONAZOLE 40 MG/ML PO SUSR: 200.0000 mg | Freq: Every day | ORAL | 0 refills | Status: AC

## 2023-05-05 MED ORDER — METOCLOPRAMIDE HCL 10 MG PO TABS: 10.0000 mg | ORAL_TABLET | Freq: Three times a day (TID) | ORAL | Status: AC | PRN

## 2023-05-05 MED ORDER — VERAPAMIL HCL ER 240 MG PO TBCR: 240.0000 mg | EXTENDED_RELEASE_TABLET | Freq: Every day | ORAL | 1 refills | Status: AC

## 2023-05-05 MED ORDER — FERROUS SULFATE 325 (65 FE) MG PO TABS: 325.0000 mg | ORAL_TABLET | Freq: Two times a day (BID) | ORAL | 1 refills | Status: AC

## 2023-05-05 NOTE — Discharge Summary (Signed)
 Physician Discharge Summary  Diane [name] FMW:[id number] DOB: [birth year] DOA: 05/02/2023  PCP: Ilah Crigler, MD  Admit date: 05/02/2023 Discharge date: 05/05/2023  Admitted From: Home Disposition:  Home  Discharge Condition:Stable CODE STATUS:FULL Diet recommendation: Heart Healthy  Brief/Interim Summary: Patient is a 53 year old female with history of hypertension, chronic palpitations, GAD, antiphospholipid syndrome, chronic low back pain, fibromyalgia, GERD, hypothyroidism, disability status, PE, rheumatoid arthritis, history of esophagitis who presented with multiple complaints including dysphagia, poor oral intake, palpitations.  Reported that she had sensation of food stuck in her throat and food pipe.  Also reports bloating, dyspepsia.  No report of melena, hematochezia.  On presentation, she was mildly hypertensive.  EKG showed sinus tachycardia.  Lab work showed WC count of 13.2, hemoglobin 8.2, potassium 3.4, creatinine 1.6.  Patient admitted for the further evaluation of dysphagia, anemia, dysphagia.  GI consulted.S/P EGD.  Found to have Candida esophagitis, benign looking esophageal stenosis status post dilatation.  Currently tolerating full liquid/soft diet.  Medically stable for discharge home today.  She will follow-up with gastroenterology as an outpatient. Following problems were addressed during the hospitalization:   Normocytic anemia/iron deficiency: Her baseline hemoglobin is around 9.5.  No history of melena or hematochezia.  FOBT not obtained yet.  Hemoglobin dropped to the range of 6 ,transfused with a unit of PRBC on 1/10.    Iron panel showed low iron, given IV iron.  Continue oral iron supplements on discharge.  History of menorrhagia but not that severe.  EGD did not show any signs/areas  of bleeding.  She follows with hematology, Dr. Timmy in the past.  We recommend to follow-up with him as an outpatient.  Continue PPI   Dysphagia: GI consulted.  Speech therapy  also consulted. S/P EGD which showed candidal esophagitis, benign looking history of stenosis, status post dilation.  GI recommended Protonix   daily,Diflucan  200 mg PO daily for 3  weeks.Repeat upper endoscopy in 2 months for follow up and repeat esophageal dilation if needed.  Currently tolerating soft, full liquid diet  AKI: Baseline creatinine normal.  Presented with creatinine of 1.6.  Continue IV fluids.  Monitor kidney function, avoid nephrotoxins.  AKI resolved.   Hypokalemia: Supplemented with potassium and corrected   Generalized anxiety disorder: On Vistaril    History of PVCs/sinus tachycardia/HTN: Patient was seen by cardiology in the past.  Outpatient Holter monitor showed PVCs, PAC.  Echo done in April 2024 showed normal EF, normal diastolic parameter.  Continue Verapamil  at increased dose.   Chest pain: Low suspicion for cardiac etiology.  Troponin negative.   GAD: Taking doxepin    History of antiphospholipid syndrome/PE in 2012: Was taking warfarin, Xarelto in the past.  Currently on aspirin .   Obesity: BMI of 37.3  Discharge Diagnoses:  Principal Problem:   AKI (acute kidney injury) (HCC) Active Problems:   Hypothyroidism   GAD (generalized anxiety disorder)   Interstitial cystitis   Fibromyalgia   GERD (gastroesophageal reflux disease)   History of pulmonary embolism   Hypokalemia   History of dysphagia   History of sinus tachycardia   Microcytic anemia   History of antiphospholipid syndrome   Hyponatremia   Dysphagia   Candida esophagitis (HCC)   Esophageal stricture    Discharge Instructions  Discharge Instructions     Diet general   Complete by: As directed    soft   Discharge instructions   Complete by: As directed    1)Please take prescribed medications as instructed 2)Follow up with your PCP  in a week.  Do a CBC test to check your hemoglobin during the follow-up 3)Follow up with your hematologist as an outpatient 4)You will called by  gastroenterology for follow-up appointment   Increase activity slowly   Complete by: As directed       Allergies as of 05/05/2023       Reactions   Topiramate Other (See Comments)   Hyperactivity   Lisinopril  Cough   Prednisone  Rash   Relpax [eletriptan Hydrobromide] Nausea And Vomiting   Eletriptan Nausea And Vomiting   Kiwi Extract Rash        Medication List     STOP taking these medications    atorvastatin  20 MG tablet Commonly known as: LIPITOR   buPROPion  150 MG 24 hr tablet Commonly known as: WELLBUTRIN  XL   esomeprazole 40 MG capsule Commonly known as: NEXIUM   mirabegron ER 25 MG Tb24 tablet Commonly known as: MYRBETRIQ       TAKE these medications    aspirin  325 MG tablet Take 325 mg by mouth daily.   Carbinoxamine Maleate ER 4 MG/5ML Suer SMARTSIG:10 Milliliter(s) By Mouth Every 12 Hours   doxepin  10 MG capsule Commonly known as: SINEQUAN  Take 30 mg by mouth at bedtime.   ferrous sulfate  325 (65 FE) MG tablet Take 1 tablet (325 mg total) by mouth 2 (two) times daily with a meal.   fluconazole  40 MG/ML suspension Commonly known as: DIFLUCAN  Take 5 mLs (200 mg total) by mouth daily for 18 days. Start taking on: May 06, 2023   hydrOXYzine  50 MG capsule Commonly known as: VISTARIL  Take 1 capsule (50 mg total) by mouth 3 (three) times daily as needed. What changed:  when to take this reasons to take this   metoCLOPramide  10 MG tablet Commonly known as: REGLAN  Take 1 tablet (10 mg total) by mouth every 8 (eight) hours as needed for nausea. What changed:  when to take this reasons to take this   pantoprazole  40 MG tablet Commonly known as: PROTONIX  Take 1 tablet (40 mg total) by mouth daily. Start taking on: May 06, 2023   phentermine  37.5 MG tablet Commonly known as: ADIPEX-P  Take 37.5 mg by mouth daily.   verapamil  240 MG CR tablet Commonly known as: CALAN -SR Take 1 tablet (240 mg total) by mouth daily. Start taking  on: May 06, 2023 What changed:  medication strength how much to take when to take this   Zomig  5 MG nasal solution Generic drug: zolmitriptan  Place 1 spray into the nose daily as needed for migraine. May repeat in 2 hours if needed. Max of 2 doses in 24 hours        Follow-up Information     Ilah Crigler, MD. Schedule an appointment as soon as possible for a visit in 1 week(s).   Specialty: Family Medicine Contact information: [address] [place] [address] [phone]                Allergies  Allergen Reactions   Topiramate Other (See Comments)    Hyperactivity   Lisinopril  Cough   Prednisone  Rash   Relpax [Eletriptan Hydrobromide] Nausea And Vomiting   Eletriptan Nausea And Vomiting   Kiwi Extract Rash    Consultations: GI   Procedures/Studies: DG Neck Soft Tissue Result Date: 05/02/2023 CLINICAL DATA:  Foreign body sensation, initial encounter EXAM: NECK SOFT TISSUES - 1+ VIEW COMPARISON:  None Available. FINDINGS: Epiglottis and aryepiglottic folds are within normal limits. No prevertebral soft tissue  swelling is noted. Mild degenerative changes of the cervical spine are seen. No acute fracture or acute facet abnormality is noted. IMPRESSION: No foreign body noted. Electronically Signed   By: Oneil Devonshire M.D.   On: 05/02/2023 17:23   DG Chest 2 View Result Date: 05/02/2023 CLINICAL DATA:  Cardiac palpitations and feel leading of something stuck in throat, initial encounter EXAM: CHEST - 2 VIEW COMPARISON:  08/07/2018 FINDINGS: The heart size and mediastinal contours are within normal limits. Both lungs are clear. The visualized skeletal structures are unremarkable. IMPRESSION: No active cardiopulmonary disease. Electronically Signed   By: Oneil Devonshire M.D.   On: 05/02/2023 17:22      Subjective: Patient seen and examined the bedside today.  She feels comfortable this morning.  Has some intermittent episodes of nausea but not vomiting and  holding food.  Wants to go home today.  Denies abdominal pain  Discharge Exam: Vitals:   05/05/23 0438 05/05/23 0800  BP: (!) 146/87 132/74  Pulse: 61 72  Resp: 16 16  Temp: (!) 97.5 F (36.4 C) 97.6 F (36.4 C)  SpO2: 99%    Vitals:   05/04/23 1930 05/04/23 2019 05/05/23 0438 05/05/23 0800  BP: 132/81 125/73 (!) 146/87 132/74  Pulse:   61 72  Resp:  16 16 16   Temp:  98.4 F (36.9 C) (!) 97.5 F (36.4 C) 97.6 F (36.4 C)  TempSrc:  Oral Oral Oral  SpO2:  98% 99%   Weight:      Height:        General: Pt is alert, awake, not in acute distress Cardiovascular: RRR, S1/S2 +, no rubs, no gallops Respiratory: CTA bilaterally, no wheezing, no rhonchi Abdominal: Soft, NT, ND, bowel sounds + Extremities: no edema, no cyanosis    The results of significant diagnostics from this hospitalization (including imaging, microbiology, ancillary and laboratory) are listed below for reference.     Microbiology: No results found for this or any previous visit (from the past 240 hours).   Labs: BNP (last 3 results) No results for input(s): BNP in the last 8760 hours. Basic Metabolic Panel: Recent Labs  Lab 05/02/23 1429 05/02/23 2321 05/03/23 0658 05/04/23 0516  NA 132*  --  136 136  K 3.4*  --  3.7 3.9  CL 98  --  104 103  CO2 25  --  24 26  GLUCOSE 126*  --  105* 135*  BUN 11  --  8 5*  CREATININE 1.63*  --  1.02* 1.04*  CALCIUM  9.4  --  8.4* 9.0  MG  --  1.8  --   --    Liver Function Tests: Recent Labs  Lab 05/02/23 1429 05/03/23 0658  AST 17 14*  ALT 15 11  ALKPHOS 91 75  BILITOT 0.4 0.3  PROT 8.4* 6.4*  ALBUMIN 3.7 2.8*   No results for input(s): LIPASE, AMYLASE in the last 168 hours. No results for input(s): AMMONIA in the last 168 hours. CBC: Recent Labs  Lab 05/02/23 1429 05/03/23 0658 05/03/23 1710 05/04/23 0516 05/05/23 0831  WBC 13.2* 10.2  --  9.6 9.5  NEUTROABS 10.1*  --   --   --   --   HGB 8.2* 6.5* 7.6* 7.5* 7.9*  HCT 28.4*  22.8* 26.5* 25.5* 26.8*  MCV 60.9* 61.6*  --  63.9* 63.7*  PLT 479* 366  --  331 348   Cardiac Enzymes: No results for input(s): CKTOTAL, CKMB, CKMBINDEX, TROPONINI in the last  168 hours. BNP: Invalid input(s): POCBNP CBG: No results for input(s): GLUCAP in the last 168 hours. D-Dimer No results for input(s): DDIMER in the last 72 hours. Hgb A1c No results for input(s): HGBA1C in the last 72 hours. Lipid Profile No results for input(s): CHOL, HDL, LDLCALC, TRIG, CHOLHDL, LDLDIRECT in the last 72 hours. Thyroid  function studies No results for input(s): TSH, T4TOTAL, T3FREE, THYROIDAB in the last 72 hours.  Invalid input(s): FREET3 Anemia work up Recent Labs    05/02/23 2321 05/02/23 2339  VITAMINB12  --  3,998*  FOLATE 14.7  --   FERRITIN  --  4*  TIBC  --  524*  IRON  --  17*  RETICCTPCT  --  1.3   Urinalysis    Component Value Date/Time   COLORURINE STRAW (A) 09/28/2021 1934   APPEARANCEUR HAZY (A) 09/28/2021 1934   LABSPEC <=1.005 09/28/2021 1934   PHURINE 5.5 09/28/2021 1934   GLUCOSEU NEGATIVE 09/28/2021 1934   HGBUR LARGE (A) 09/28/2021 1934   BILIRUBINUR NEGATIVE 09/28/2021 1934   BILIRUBINUR negative 06/17/2012 1519   KETONESUR NEGATIVE 09/28/2021 1934   PROTEINUR NEGATIVE 09/28/2021 1934   UROBILINOGEN 0.2 01/10/2015 2158   NITRITE POSITIVE (A) 09/28/2021 1934   LEUKOCYTESUR TRACE (A) 09/28/2021 1934   Sepsis Labs Recent Labs  Lab 05/02/23 1429 05/03/23 0658 05/04/23 0516 05/05/23 0831  WBC 13.2* 10.2 9.6 9.5   Microbiology No results found for this or any previous visit (from the past 240 hours).  Please note: You were cared for by a hospitalist during your hospital stay. Once you are discharged, your primary care physician will handle any further medical issues. Please note that NO REFILLS for any discharge medications will be authorized once you are discharged, as it is imperative that you return to your  primary care physician (or establish a relationship with a primary care physician if you do not have one) for your post hospital discharge needs so that they can reassess your need for medications and monitor your lab values.    Time coordinating discharge: 40 minutes  SIGNED:   Ivonne Mustache, MD  [facility] 05/05/2023, 10:49 AM Pager [phone]  If 7PM-7AM, please contact night-coverage [email or web address] Password [email or web address]

## 2023-05-06 ENCOUNTER — Telehealth: Payer: Self-pay | Admitting: *Deleted

## 2023-05-06 ENCOUNTER — Encounter (HOSPITAL_COMMUNITY): Payer: Self-pay | Admitting: Gastroenterology

## 2023-05-06 LAB — TYPE AND SCREEN
ABO/RH(D): O POS
Antibody Screen: NEGATIVE
Unit division: 0
Unit division: 0

## 2023-05-06 LAB — BPAM RBC
Blood Product Expiration Date: 202501172359
Blood Product Expiration Date: 202501172359
ISSUE DATE / TIME: 202501101118
ISSUE DATE / TIME: 202501101317
Unit Type and Rh: 5100
Unit Type and Rh: 5100

## 2023-05-06 NOTE — Telephone Encounter (Signed)
 Patient has been scheduled for hospital follow up on Monday, 05/27/23 at 10 am with [facility] PA-C.   Left message for patient to call back.

## 2023-05-06 NOTE — Telephone Encounter (Signed)
===  View-only below this line=== ----- Message ----- From: Mollie Nestor CHRISTELLA DEVONNA Sent: 05/04/2023  12:37 PM EST To: Naomie LOISE Sharps, RN Subject: follow up                                      Please set up with Pyrtle or any app. Appt sometime in the next 30days would be amazing

## 2023-05-07 LAB — SURGICAL PATHOLOGY

## 2023-05-07 NOTE — Telephone Encounter (Signed)
 Left message for patient to call back

## 2023-05-09 NOTE — Telephone Encounter (Signed)
Following 2 attempts to reach patient with no return correspondence, I will send a letter to patient's home address to advise of follow up that has been scheduled (she also does not have mychart).

## 2023-05-27 ENCOUNTER — Encounter: Payer: Self-pay | Admitting: Gastroenterology

## 2023-05-27 ENCOUNTER — Ambulatory Visit: Payer: Federal, State, Local not specified - PPO | Admitting: Gastroenterology
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
## 2023-07-10 ENCOUNTER — Other Ambulatory Visit: Payer: Self-pay | Admitting: Cardiology

## 2023-07-10 DIAGNOSIS — I10 Essential (primary) hypertension: Secondary | ICD-10-CM

## 2023-08-01 ENCOUNTER — Other Ambulatory Visit: Payer: Self-pay | Admitting: Cardiology

## 2023-08-01 DIAGNOSIS — I10 Essential (primary) hypertension: Secondary | ICD-10-CM

## 2023-08-16 ENCOUNTER — Other Ambulatory Visit: Payer: Self-pay | Admitting: Cardiology

## 2023-08-16 DIAGNOSIS — I10 Essential (primary) hypertension: Secondary | ICD-10-CM
